# Patient Record
Sex: Female | Born: 1935 | Hispanic: Yes | Smoking: Never smoker
Health system: Southern US, Community
[De-identification: ages and names within clinical notes are randomized; demographics above are authoritative.]

## PROBLEM LIST (undated history)

## (undated) DIAGNOSIS — I10 Essential (primary) hypertension: Secondary | ICD-10-CM

## (undated) DIAGNOSIS — Z973 Presence of spectacles and contact lenses: Secondary | ICD-10-CM

## (undated) DIAGNOSIS — Z972 Presence of dental prosthetic device (complete) (partial): Secondary | ICD-10-CM

## (undated) DIAGNOSIS — M8580 Other specified disorders of bone density and structure, unspecified site: Secondary | ICD-10-CM

## (undated) DIAGNOSIS — E039 Hypothyroidism, unspecified: Secondary | ICD-10-CM

## (undated) DIAGNOSIS — E559 Vitamin D deficiency, unspecified: Secondary | ICD-10-CM

## (undated) DIAGNOSIS — E7800 Pure hypercholesterolemia, unspecified: Secondary | ICD-10-CM

## (undated) DIAGNOSIS — Z9989 Dependence on other enabling machines and devices: Secondary | ICD-10-CM

## (undated) DIAGNOSIS — Z9181 History of falling: Secondary | ICD-10-CM

## (undated) DIAGNOSIS — I208 Other forms of angina pectoris: Secondary | ICD-10-CM

## (undated) DIAGNOSIS — I739 Peripheral vascular disease, unspecified: Secondary | ICD-10-CM

## (undated) DIAGNOSIS — I2089 Other forms of angina pectoris: Secondary | ICD-10-CM

## (undated) DIAGNOSIS — I2510 Atherosclerotic heart disease of native coronary artery without angina pectoris: Secondary | ICD-10-CM

## (undated) DIAGNOSIS — F329 Major depressive disorder, single episode, unspecified: Secondary | ICD-10-CM

## (undated) DIAGNOSIS — G4733 Obstructive sleep apnea (adult) (pediatric): Secondary | ICD-10-CM

## (undated) DIAGNOSIS — K219 Gastro-esophageal reflux disease without esophagitis: Secondary | ICD-10-CM

## (undated) DIAGNOSIS — M549 Dorsalgia, unspecified: Secondary | ICD-10-CM

## (undated) DIAGNOSIS — H269 Unspecified cataract: Secondary | ICD-10-CM

## (undated) HISTORY — PX: UPPER GI ENDOSCOPY: SHX6162

## (undated) HISTORY — DX: Dependence on other enabling machines and devices: Z99.89

## (undated) HISTORY — PX: BACK SURGERY: SHX140

## (undated) HISTORY — DX: History of falling: Z91.81

## (undated) HISTORY — DX: Pure hypercholesterolemia, unspecified: E78.00

## (undated) HISTORY — DX: Other forms of angina pectoris: I20.8

## (undated) HISTORY — DX: Hypothyroidism, unspecified: E03.9

## (undated) HISTORY — PX: SPINAL FUSION: SHX223

## (undated) HISTORY — DX: Other specified disorders of bone density and structure, unspecified site: M85.80

## (undated) HISTORY — DX: Gastro-esophageal reflux disease without esophagitis: K21.9

## (undated) HISTORY — DX: Major depressive disorder, single episode, unspecified: F32.9

## (undated) HISTORY — DX: Essential (primary) hypertension: I10

## (undated) HISTORY — PX: ANKLE SURGERY: SHX546

## (undated) HISTORY — DX: Atherosclerotic heart disease of native coronary artery without angina pectoris: I25.10

## (undated) HISTORY — PX: MULTIPLE TOOTH EXTRACTIONS: SHX2053

## (undated) HISTORY — DX: Vitamin D deficiency, unspecified: E55.9

## (undated) HISTORY — DX: Dorsalgia, unspecified: M54.9

## (undated) HISTORY — DX: Peripheral vascular disease, unspecified: I73.9

## (undated) HISTORY — DX: Other forms of angina pectoris: I20.89

## (undated) HISTORY — PX: EYE SURGERY: SHX253

## (undated) HISTORY — DX: Obstructive sleep apnea (adult) (pediatric): G47.33

## (undated) HISTORY — PX: COLONOSCOPY: SHX174

---

## 2008-06-07 HISTORY — PX: CARDIAC CATHETERIZATION: SHX172

## 2018-05-21 ENCOUNTER — Ambulatory Visit
Admission: RE | Admit: 2018-05-21 | Discharge: 2018-05-21 | Disposition: A | Discharge status: Home / Self Care | Payer: No Typology Code available for payment source | Source: Ambulatory Visit | Attending: Gerontology | Admitting: Gerontology

## 2018-05-21 ENCOUNTER — Other Ambulatory Visit: Payer: Self-pay | Admitting: Gerontology

## 2018-05-21 DIAGNOSIS — M545 Low back pain, unspecified: Secondary | ICD-10-CM

## 2018-05-21 DIAGNOSIS — R52 Pain, unspecified: Secondary | ICD-10-CM

## 2018-05-21 DIAGNOSIS — S2239XA Fracture of one rib, unspecified side, initial encounter for closed fracture: Secondary | ICD-10-CM

## 2018-05-28 ENCOUNTER — Encounter (HOSPITAL_BASED_OUTPATIENT_CLINIC_OR_DEPARTMENT_OTHER): Payer: Self-pay

## 2018-05-28 DIAGNOSIS — R5383 Other fatigue: Secondary | ICD-10-CM

## 2018-06-04 ENCOUNTER — Encounter: Payer: Self-pay | Admitting: Internal Medicine

## 2018-06-04 ENCOUNTER — Ambulatory Visit (INDEPENDENT_AMBULATORY_CARE_PROVIDER_SITE_OTHER): Payer: Medicare (Managed Care) | Admitting: Internal Medicine

## 2018-06-04 VITALS — BP 112/64 | HR 75 | Ht 60.0 in | Wt 163.2 lb

## 2018-06-04 DIAGNOSIS — I200 Unstable angina: Secondary | ICD-10-CM | POA: Diagnosis not present

## 2018-06-04 DIAGNOSIS — R0609 Other forms of dyspnea: Secondary | ICD-10-CM

## 2018-06-04 DIAGNOSIS — I10 Essential (primary) hypertension: Secondary | ICD-10-CM

## 2018-06-04 DIAGNOSIS — E785 Hyperlipidemia, unspecified: Secondary | ICD-10-CM | POA: Diagnosis not present

## 2018-06-04 LAB — COMPREHENSIVE METABOLIC PANEL
A/G RATIO: 1.8 (ref 1.2–2.2)
ALK PHOS: 87 IU/L (ref 39–117)
ALT: 10 IU/L (ref 0–32)
AST: 15 IU/L (ref 0–40)
Albumin: 4.2 g/dL (ref 3.5–4.7)
BILIRUBIN TOTAL: 0.3 mg/dL (ref 0.0–1.2)
BUN / CREAT RATIO: 29 — AB (ref 12–28)
BUN: 29 mg/dL — AB (ref 8–27)
CALCIUM: 9.6 mg/dL (ref 8.7–10.3)
CHLORIDE: 103 mmol/L (ref 96–106)
CO2: 25 mmol/L (ref 20–29)
Creatinine, Ser: 0.99 mg/dL (ref 0.57–1.00)
GFR calc non Af Amer: 54 mL/min/{1.73_m2} — ABNORMAL LOW (ref >59)
GFR, EST AFRICAN AMERICAN: 62 mL/min/{1.73_m2} (ref >59)
GLOBULIN, TOTAL: 2.4 g/dL (ref 1.5–4.5)
Glucose: 86 mg/dL (ref 65–99)
Potassium: 5.4 mmol/L — ABNORMAL HIGH (ref 3.5–5.2)
SODIUM: 141 mmol/L (ref 134–144)
TOTAL PROTEIN: 6.6 g/dL (ref 6.0–8.5)

## 2018-06-04 LAB — LIPID PANEL
Chol/HDL Ratio: 1.9 ratio (ref 0.0–4.4)
Cholesterol, Total: 124 mg/dL (ref 100–199)
HDL: 66 mg/dL (ref >39)
LDL Calculated: 47 mg/dL (ref 0–99)
Triglycerides: 56 mg/dL (ref 0–149)
VLDL CHOLESTEROL CAL: 11 mg/dL (ref 5–40)

## 2018-06-04 LAB — CBC
HEMOGLOBIN: 12.1 g/dL (ref 11.1–15.9)
Hematocrit: 37.7 % (ref 34.0–46.6)
MCH: 26 pg — ABNORMAL LOW (ref 26.6–33.0)
MCHC: 32.1 g/dL (ref 31.5–35.7)
MCV: 81 fL (ref 79–97)
Platelets: 292 10*3/uL (ref 150–450)
RBC: 4.65 x10E6/uL (ref 3.77–5.28)
RDW: 15.8 % — AB (ref 12.3–15.4)
WBC: 6.8 10*3/uL (ref 3.4–10.8)

## 2018-06-04 MED ORDER — RANOLAZINE ER 1000 MG PO TB12: 1000.0000 mg | ORAL_TABLET | Freq: Two times a day (BID) | ORAL | 3 refills | Status: DC

## 2018-06-04 NOTE — Progress Notes (Unsigned)
LEXISCAN LETTER

## 2018-06-04 NOTE — Progress Notes (Signed)
New Outpatient Visit Date: 06/04/2018  Referring Provider: Jethro Bastos, MD [address] [place], [place] [address]  Chief Complaint: Chest pain  HPI:  Jennifer [name] is a 84 y.o. female who is being seen today for the evaluation of stable angina at the request of Jethro Bastos, MD. She has a history of coronary artery disease with stable angina, hypertension, hyperlipidemia, obstructive sleep apnea on CPAP, GERD with history of hiatal hernia and esophageal stricture, and chronic back pain with radiculopathy.  The patient moved to [place] from [place] approximately 2 months ago.  She has a long history of stable angina following PCI to the LAD in 06/2008.  She reports chronic exertional dyspnea and chest pressure when walking more than a block.  Maximal pain intensity is 6/10, with the discomfort resolving with rest over 5 to 10 minutes.  However, over the last 2 months she feels as though the symptoms are coming on a little bit faster than they had in the past.  She last used sublingual nitroglycerin about a month ago.  She has been maintained on carvedilol and ranolazine for years.  Her last follow-up with her cardiologist in [place], [place], was approximately 6 months ago.  She was previously told that her LAD stent was patent but at that a small vessel was jailed by the stent and needed to be managed medically.  She reports having had a normal stress test about a year ago while in [place].  The patient has occasional "skipped beats" and rare dizziness.  She has not passed out.  She has stable orthopnea and uses CPAP nightly.  She has minimal chronic leg edema.  --------------------------------------------------------------------------------------------------  Cardiovascular History & Procedures: Cardiovascular Problems:  Coronary artery disease with stable angina  Risk Factors:  Known coronary artery disease, hypertension, hyperlipidemia, obesity, and age greater than  63  Cath/PCI:  LHC/PCI (06/2008): Promus 3.0 x 12 mm drug-eluting stent placed to the LAD.  Further details not available.  CV Surgery:  None  EP Procedures and Devices:  None  Non-Invasive Evaluation(s):  None available.  --------------------------------------------------------------------------------------------------  Past Medical History:  Diagnosis Date  . At risk for falls   . CAD (coronary artery disease)   . GERD (gastroesophageal reflux disease)   . Hypercholesterolemia   . Hypertension   . Hypothyroidism   . Major depression   . Musculoskeletal back pain    CHRONIC LEFT-SIDED DISCOMFORT  . Obstructive sleep apnea on CPAP   . Osteopenia   . PVD (peripheral vascular disease) (HCC)   . Stable angina (HCC)   . Vitamin D deficiency     Past Surgical History:  Procedure Laterality Date  . ANKLE SURGERY Left   . CARDIAC CATHETERIZATION  06/2008   PCI to LAD with 3.0 x 12 mm DES  . SPINAL FUSION      Current Meds  Medication Sig  . acetaminophen (TYLENOL) 325 MG tablet Take 650 mg by mouth every 12 (twelve) hours.  Marland Kitchen aspirin EC 81 MG tablet Take 81 mg by mouth daily. CARDIAC EVENT PREVENTION  . atorvastatin (LIPITOR) 20 MG tablet Take 20 mg by mouth daily.  . carvedilol (COREG) 25 MG tablet Take 25 mg by mouth 2 (two) times daily with a meal.  . Cholecalciferol (VITAMIN D3) 5000 units TABS Take 1 tablet by mouth daily with breakfast.  . enalapril (VASOTEC) 20 MG tablet Take 20 mg by mouth daily.  . furosemide (LASIX) 20 MG tablet Take 20 mg by  mouth daily.  Marland Kitchen levothyroxine (SYNTHROID, LEVOTHROID) 50 MCG tablet Take 50 mcg by mouth daily before breakfast.  . mirtazapine (REMERON) 15 MG tablet Take 7.5 mg by mouth at bedtime. TAKE 1/2 TABLET BY MOUTH AT BEDTIME.  . naproxen (NAPROSYN) 500 MG tablet Take 500 mg by mouth 2 (two) times daily with a meal.  . nitroGLYCERIN (NITROSTAT) 0.4 MG SL tablet Place 0.4 mg under the tongue every 5 (five) minutes as needed  for chest pain.  . pantoprazole (PROTONIX) 20 MG tablet Take 20 mg by mouth daily.  . traMADol (ULTRAM) 50 MG tablet Take by mouth daily as needed for moderate pain.  . [DISCONTINUED] ranolazine (RANEXA) 500 MG 12 hr tablet Take 500 mg by mouth 2 (two) times daily.    Allergies: Patient has no known allergies.  Social History   Tobacco Use  . Smoking status: Never Smoker  . Smokeless tobacco: Never Used  Substance Use Topics  . Alcohol use: Never    Frequency: Never  . Drug use: Never    Family History  Problem Relation Age of Onset  . Heart disease Mother   . Asthma Father   . Heart disease Brother 19       CAD    Review of Systems: A 12-system review of systems was performed and was negative except as noted in the HPI.  --------------------------------------------------------------------------------------------------  Physical Exam: BP 112/64   Pulse 75   Ht 5' (1.524 m)   Wt 163 lb 4 oz (74 kg)   SpO2 95%   BMI 31.88 kg/m   General: NAD. HEENT: No conjunctival pallor or scleral icterus. Moist mucous membranes. OP clear. Neck: Supple without lymphadenopathy, thyromegaly, JVD, or HJR. No carotid bruit. Lungs: Normal work of breathing. Clear to auscultation bilaterally without wheezes or crackles. Heart: Regular rate and rhythm without murmurs, rubs, or gallops. Non-displaced PMI. Abd: Bowel sounds present. Soft, NT/ND without hepatosplenomegaly Ext: Trace pretibial edema bilaterally. Radial, PT, and DP pulses are 2+ bilaterally Skin: Warm and dry without rash. Neuro: CNIII-XII intact. Strength and fine-touch sensation intact in upper and lower extremities bilaterally. Psych: Normal mood and affect.  EKG: Sinus bradycardia (heart rate 54 bpm) with isolated PACs.  Otherwise, no significant abnormalities.  Outside labs (05/27/2018): CBC: White blood cell count 6.5, hemoglobin 10.9, hematocrit 34.7, platelets 264  CMP: Sodium 140, potassium 5.2, chloride 104, CO2  26, BUN 14, creatinine 0.9, glucose 95 calcium 9.0, AST 20, ALT 8, phosphatase 96, total bilirubin 0.2, total protein 6.3, albumin 3.4  Lipid panel: Total cholesterol 101, triglycerides 45, HDL 61, LDL 31  TSH 1.43  Total CK 50  --------------------------------------------------------------------------------------------------  ASSESSMENT AND PLAN: Coronary artery disease with accelerating angina The patient has a long history of angina that seems to have gotten slightly worse over the last few months since moving to [place].  She does not have any symptoms at rest: Symptoms are consistent with CCS class II-III.  EKG today does not show any acute ischemic changes.  We will request records from her former cardiologist Freeman Caldron in [place], [place]).  We will also arrange for a pharmacologic myocardial perfusion stress test to assess for significant ischemia.  In the meantime, I will increase ranolazine to 1000 mg twice daily.  We will check a CBC, CMP, and lipid panel to establish baseline labs today.  Dyspnea on exertion Long-standing problem.  Other than mild lower extremity edema, Jennifer [name] appears euvolemic on exam.  We will obtain records, as above,  increase ranolazine to 1000 mg twice daily, and obtain a transthoracic echocardiogram.  If stress test and echocardiogram are unrevealing, de-escalation of carvedilol may need to be considered in case excessive beta-blockade is contributing to her DOE.  We will continue with furosemide 20 mg daily for possible component of chronic diastolic heart failure.  Hyperlipidemia Goal LDL less than 70, given history of CAD and PCI.  I will check a CMP and fasting lipid panel today.  We will continue atorvastatin 20 mg daily for now, though titration may be necessary based on results of lipid panel.  Hypertension Blood pressure well controlled today.  I will not make any medication changes at this time.  Follow-up: Return to clinic in 1  month.  Yvonne Kendallhristopher Magally Vahle, MD 06/05/2018 12:33 PM

## 2018-06-04 NOTE — Patient Instructions (Addendum)
Medication Instructions:  INCREASE Ranexa to 1,000 mg twice per day  -- If you need a refill on your cardiac medications before your next appointment, please call your pharmacy. --  Labwork:TODAY CBC/CMET/LIPID PROFILE  Testing/Procedures: PLEASE SCHEDULE Your physician has requested that you have an echocardiogram. Echocardiography is a painless test that uses sound waves to create images of your heart. It provides your doctor with information about the size and shape of your heart and how well your heart's chambers and valves are working. This procedure takes approximately one hour. There are no restrictions for this procedure.  Your physician has requested that you have a lexiscan myoview. For further information please visit [email or web address]. Please follow instruction sheet, as given.   Follow-Up: Your physician wants you to follow-up in: 1 MONTH with APP    Thank you for choosing [facility]!!    Any Other Special Instructions Will Be Listed Below (If Applicable).

## 2018-06-05 ENCOUNTER — Encounter: Payer: Self-pay | Admitting: Internal Medicine

## 2018-06-05 DIAGNOSIS — I25119 Atherosclerotic heart disease of native coronary artery with unspecified angina pectoris: Secondary | ICD-10-CM | POA: Insufficient documentation

## 2018-06-05 DIAGNOSIS — E785 Hyperlipidemia, unspecified: Secondary | ICD-10-CM | POA: Insufficient documentation

## 2018-06-05 DIAGNOSIS — I10 Essential (primary) hypertension: Secondary | ICD-10-CM | POA: Insufficient documentation

## 2018-06-05 DIAGNOSIS — R0609 Other forms of dyspnea: Secondary | ICD-10-CM

## 2018-06-07 ENCOUNTER — Other Ambulatory Visit: Payer: Self-pay

## 2018-06-07 DIAGNOSIS — E875 Hyperkalemia: Secondary | ICD-10-CM

## 2018-06-14 ENCOUNTER — Telehealth (HOSPITAL_COMMUNITY): Payer: Self-pay | Admitting: *Deleted

## 2018-06-14 NOTE — Telephone Encounter (Signed)
Patient given detailed instructions per Myocardial Perfusion Study Information Sheet for the test on 06/17/18 at 0715. Patient notified to arrive 15 minutes early and that it is imperative to arrive on time for appointment to keep from having the test rescheduled.  If you need to cancel or reschedule your appointment, please call the office within 24 hours of your appointment. . Patient verbalized understanding.Joshlynn Alfonzo, Adelene IdlerCynthia W

## 2018-06-17 ENCOUNTER — Encounter (HOSPITAL_COMMUNITY): Payer: Self-pay | Admitting: Emergency Medicine

## 2018-06-17 ENCOUNTER — Ambulatory Visit (HOSPITAL_BASED_OUTPATIENT_CLINIC_OR_DEPARTMENT_OTHER): Payer: Medicare (Managed Care)

## 2018-06-17 ENCOUNTER — Emergency Department (HOSPITAL_COMMUNITY)
Admission: EM | Admit: 2018-06-17 | Discharge: 2018-06-17 | Disposition: A | Discharge status: Home / Self Care | Payer: Medicare (Managed Care) | Attending: Emergency Medicine | Admitting: Emergency Medicine

## 2018-06-17 ENCOUNTER — Other Ambulatory Visit: Payer: Self-pay

## 2018-06-17 ENCOUNTER — Other Ambulatory Visit: Payer: Medicare (Managed Care)

## 2018-06-17 DIAGNOSIS — E875 Hyperkalemia: Secondary | ICD-10-CM | POA: Insufficient documentation

## 2018-06-17 DIAGNOSIS — I2510 Atherosclerotic heart disease of native coronary artery without angina pectoris: Secondary | ICD-10-CM | POA: Insufficient documentation

## 2018-06-17 DIAGNOSIS — I200 Unstable angina: Secondary | ICD-10-CM

## 2018-06-17 DIAGNOSIS — G4733 Obstructive sleep apnea (adult) (pediatric): Secondary | ICD-10-CM | POA: Insufficient documentation

## 2018-06-17 DIAGNOSIS — E785 Hyperlipidemia, unspecified: Secondary | ICD-10-CM | POA: Insufficient documentation

## 2018-06-17 DIAGNOSIS — R899 Unspecified abnormal finding in specimens from other organs, systems and tissues: Secondary | ICD-10-CM

## 2018-06-17 DIAGNOSIS — I10 Essential (primary) hypertension: Secondary | ICD-10-CM | POA: Insufficient documentation

## 2018-06-17 DIAGNOSIS — I340 Nonrheumatic mitral (valve) insufficiency: Secondary | ICD-10-CM | POA: Insufficient documentation

## 2018-06-17 DIAGNOSIS — R079 Chest pain, unspecified: Secondary | ICD-10-CM | POA: Insufficient documentation

## 2018-06-17 DIAGNOSIS — R0609 Other forms of dyspnea: Secondary | ICD-10-CM

## 2018-06-17 DIAGNOSIS — R9439 Abnormal result of other cardiovascular function study: Secondary | ICD-10-CM | POA: Insufficient documentation

## 2018-06-17 DIAGNOSIS — Z7982 Long term (current) use of aspirin: Secondary | ICD-10-CM | POA: Insufficient documentation

## 2018-06-17 DIAGNOSIS — R002 Palpitations: Secondary | ICD-10-CM

## 2018-06-17 DIAGNOSIS — E7800 Pure hypercholesterolemia, unspecified: Secondary | ICD-10-CM | POA: Diagnosis not present

## 2018-06-17 DIAGNOSIS — E039 Hypothyroidism, unspecified: Secondary | ICD-10-CM | POA: Insufficient documentation

## 2018-06-17 DIAGNOSIS — R42 Dizziness and giddiness: Secondary | ICD-10-CM | POA: Insufficient documentation

## 2018-06-17 DIAGNOSIS — I272 Pulmonary hypertension, unspecified: Secondary | ICD-10-CM | POA: Insufficient documentation

## 2018-06-17 DIAGNOSIS — Z79899 Other long term (current) drug therapy: Secondary | ICD-10-CM | POA: Insufficient documentation

## 2018-06-17 DIAGNOSIS — I25110 Atherosclerotic heart disease of native coronary artery with unstable angina pectoris: Secondary | ICD-10-CM

## 2018-06-17 LAB — CBC WITH DIFFERENTIAL/PLATELET
Abs Immature Granulocytes: 0 10*3/uL (ref 0.0–0.1)
BASOS ABS: 0.1 10*3/uL (ref 0.0–0.1)
Basophils Relative: 1 %
EOS PCT: 14 %
Eosinophils Absolute: 0.7 10*3/uL (ref 0.0–0.7)
HCT: 35.6 % — ABNORMAL LOW (ref 36.0–46.0)
HEMOGLOBIN: 11.1 g/dL — AB (ref 12.0–15.0)
IMMATURE GRANULOCYTES: 0 %
LYMPHS PCT: 27 %
Lymphs Abs: 1.4 10*3/uL (ref 0.7–4.0)
MCH: 26.1 pg (ref 26.0–34.0)
MCHC: 31.2 g/dL (ref 30.0–36.0)
MCV: 83.6 fL (ref 78.0–100.0)
Monocytes Absolute: 0.6 10*3/uL (ref 0.1–1.0)
Monocytes Relative: 12 %
NEUTROS PCT: 46 %
Neutro Abs: 2.4 10*3/uL (ref 1.7–7.7)
Platelets: 249 10*3/uL (ref 150–400)
RBC: 4.26 MIL/uL (ref 3.87–5.11)
RDW: 17.5 % — AB (ref 11.5–15.5)
WBC: 5.2 10*3/uL (ref 4.0–10.5)

## 2018-06-17 LAB — MYOCARDIAL PERFUSION IMAGING
CHL CUP NUCLEAR SSS: 11
CHL CUP RESTING HR STRESS: 57 {beats}/min
LVDIAVOL: 64 mL (ref 46–106)
LVSYSVOL: 19 mL
Peak HR: 86 {beats}/min
RATE: 0.29
SDS: 3
SRS: 8
TID: 0.84

## 2018-06-17 LAB — BASIC METABOLIC PANEL
ANION GAP: 6 (ref 5–15)
BUN: 18 mg/dL (ref 8–23)
CALCIUM: 8.7 mg/dL — AB (ref 8.9–10.3)
CHLORIDE: 106 mmol/L (ref 98–111)
CO2: 29 mmol/L (ref 22–32)
CREATININE: 1.14 mg/dL — AB (ref 0.44–1.00)
GFR calc non Af Amer: 44 mL/min — ABNORMAL LOW (ref >60)
GFR, EST AFRICAN AMERICAN: 51 mL/min — AB (ref >60)
Glucose, Bld: 122 mg/dL — ABNORMAL HIGH (ref 70–99)
Potassium: 4.2 mmol/L (ref 3.5–5.1)
SODIUM: 141 mmol/L (ref 135–145)

## 2018-06-17 LAB — I-STAT CHEM 8, ED
BUN: 20 mg/dL (ref 8–23)
CHLORIDE: 104 mmol/L (ref 98–111)
Calcium, Ion: 1.19 mmol/L (ref 1.15–1.40)
Creatinine, Ser: 1 mg/dL (ref 0.44–1.00)
GLUCOSE: 115 mg/dL — AB (ref 70–99)
HEMATOCRIT: 36 % (ref 36.0–46.0)
HEMOGLOBIN: 12.2 g/dL (ref 12.0–15.0)
POTASSIUM: 4 mmol/L (ref 3.5–5.1)
SODIUM: 141 mmol/L (ref 135–145)
TCO2: 29 mmol/L (ref 22–32)

## 2018-06-17 LAB — ECHOCARDIOGRAM COMPLETE
HEIGHTINCHES: 60 in
WEIGHTICAEL: 2608 [oz_av]

## 2018-06-17 MED ORDER — REGADENOSON 0.4 MG/5ML IV SOLN
0.4000 mg | Freq: Once | INTRAVENOUS | Status: AC
  Administered 2018-06-17: 0.4 mg via INTRAVENOUS

## 2018-06-17 MED ORDER — SODIUM CHLORIDE 0.9 % IV BOLUS
1000.0000 mL | Freq: Once | INTRAVENOUS | Status: AC
  Administered 2018-06-17: 1000 mL via INTRAVENOUS

## 2018-06-17 MED ORDER — TECHNETIUM TC 99M TETROFOSMIN IV KIT
10.3000 | PACK | Freq: Once | INTRAVENOUS | Status: AC | PRN
  Administered 2018-06-17: 10.3 via INTRAVENOUS
  Filled 2018-06-17: qty 11

## 2018-06-17 MED ORDER — TECHNETIUM TC 99M TETROFOSMIN IV KIT
31.6000 | PACK | Freq: Once | INTRAVENOUS | Status: AC | PRN
  Administered 2018-06-17: 31.6 via INTRAVENOUS
  Filled 2018-06-17: qty 32

## 2018-06-17 NOTE — ED Triage Notes (Signed)
Pt from home, states she went to cardiologist for stress test And EKG. Went home and Cards called and said K. 7, they advised her go to ER for evaluation. Denies chest pain or complaints. 146/84, HR 62, resp 16, 98% room air.

## 2018-06-17 NOTE — ED Provider Notes (Signed)
[facility] EMERGENCY DEPARTMENT Provider Note   CSN: [id number] Arrival date & time: 06/17/18  1824     History   Chief Complaint Chief Complaint  Patient presents with  . abnormal labs    HPI Jennifer [name] is a 84 y.o. female.  84 yo F with a chief complaint of hyperkalemia.  Patient is unsure why she had her labs drawn today.  She denies any symptoms.  She was called and told that her potassium was high and that she should come to the emergency department for evaluation.  She says they added a fluid pill in the past couple weeks but does not think they have added any other medications.  She is not sure why they started her on a fluid pill.  The history is provided by the patient.  Illness  This is a new problem. The current episode started less than 1 hour ago. The problem occurs constantly. The problem has not changed since onset.Pertinent negatives include no chest pain, no headaches and no shortness of breath. Nothing aggravates the symptoms. Nothing relieves the symptoms. She has tried nothing for the symptoms. The treatment provided no relief.    Past Medical History:  Diagnosis Date  . At risk for falls   . CAD (coronary artery disease)   . GERD (gastroesophageal reflux disease)   . Hypercholesterolemia   . Hypertension   . Hypothyroidism   . Major depression   . Musculoskeletal back pain    CHRONIC LEFT-SIDED DISCOMFORT  . Obstructive sleep apnea on CPAP   . Osteopenia   . PVD (peripheral vascular disease) (HCC)   . Stable angina (HCC)   . Vitamin D deficiency     Patient Active Problem List   Diagnosis Date Noted  . Accelerating angina (HCC) 06/05/2018  . Dyspnea on exertion 06/05/2018  . Hyperlipidemia LDL goal <70 06/05/2018  . Essential hypertension 06/05/2018    Past Surgical History:  Procedure Laterality Date  . ANKLE SURGERY Left   . CARDIAC CATHETERIZATION  06/2008   PCI to LAD with 3.0 x 12 mm DES  . SPINAL FUSION        OB History   None      Home Medications    Prior to Admission medications   Medication Sig Start Date End Date Taking? Authorizing Provider  acetaminophen (TYLENOL) 325 MG tablet Take 650 mg by mouth every 12 (twelve) hours.    [provider]  aspirin EC 81 MG tablet Take 81 mg by mouth daily. CARDIAC EVENT PREVENTION    [provider]  atorvastatin (LIPITOR) 20 MG tablet Take 20 mg by mouth daily.    [provider]  carvedilol (COREG) 25 MG tablet Take 25 mg by mouth 2 (two) times daily with a meal.    [provider]  Cholecalciferol (VITAMIN D3) 5000 units TABS Take 1 tablet by mouth daily with breakfast.    [provider]  enalapril (VASOTEC) 20 MG tablet Take 20 mg by mouth daily.    [provider]  furosemide (LASIX) 20 MG tablet Take 20 mg by mouth daily.    [provider]  levothyroxine (SYNTHROID, LEVOTHROID) 50 MCG tablet Take 50 mcg by mouth daily before breakfast.    [provider]  mirtazapine (REMERON) 15 MG tablet Take 7.5 mg by mouth at bedtime. TAKE 1/2 TABLET BY MOUTH AT BEDTIME.    [provider]  naproxen (NAPROSYN) 500 MG tablet Take 500 mg by mouth  2 (two) times daily with a meal.    [provider]  nitroGLYCERIN (NITROSTAT) 0.4 MG SL tablet Place 0.4 mg under the tongue every 5 (five) minutes as needed for chest pain.    [provider]  pantoprazole (PROTONIX) 20 MG tablet Take 20 mg by mouth daily.    [provider]  ranolazine (RANEXA) 1000 MG SR tablet Take 1 tablet (1,000 mg total) by mouth 2 (two) times daily. 06/04/18   End, Cristal Deerhristopher, MD  traMADol (ULTRAM) 50 MG tablet Take by mouth daily as needed for moderate pain.    [provider]    Family History Family History  Problem Relation Age of Onset  . Heart disease Mother   . Asthma Father   . Heart disease Brother 1435       CAD    Social History Social History    Tobacco Use  . Smoking status: Never Smoker  . Smokeless tobacco: Never Used  Substance Use Topics  . Alcohol use: Never    Frequency: Never  . Drug use: Never     Allergies   Patient has no known allergies.   Review of Systems Review of Systems  Constitutional: Negative for chills and fever.  HENT: Negative for congestion and rhinorrhea.   Eyes: Negative for redness and visual disturbance.  Respiratory: Negative for shortness of breath and wheezing.   Cardiovascular: Negative for chest pain and palpitations.  Gastrointestinal: Negative for nausea and vomiting.  Genitourinary: Negative for dysuria and urgency.  Musculoskeletal: Negative for arthralgias and myalgias.  Skin: Negative for pallor and wound.  Neurological: Negative for dizziness and headaches.     Physical Exam Updated Vital Signs BP (!) 145/63   Pulse (!) 58   Temp 98.3 F (36.8 C) (Oral)   Resp (!) 21   Wt 73.9 kg (163 lb)   SpO2 94%   BMI 31.83 kg/m   Physical Exam  Constitutional: She is oriented to person, place, and time. She appears well-developed and well-nourished. No distress.  HENT:  Head: Normocephalic and atraumatic.  Eyes: Pupils are equal, round, and reactive to light. EOM are normal.  Neck: Normal range of motion. Neck supple.  Cardiovascular: Normal rate and regular rhythm. Exam reveals no gallop and no friction rub.  No murmur heard. Pulmonary/Chest: Effort normal. She has no wheezes. She has no rales.  Abdominal: Soft. She exhibits no distension. There is no tenderness.  Musculoskeletal: She exhibits no edema or tenderness.  Neurological: She is alert and oriented to person, place, and time.  Skin: Skin is warm and dry. She is not diaphoretic.  Psychiatric: She has a normal mood and affect. Her behavior is normal.  Nursing note and vitals reviewed.    ED Treatments / Results  Labs (all labs ordered are listed, but only abnormal results are displayed) Labs Reviewed  CBC  WITH DIFFERENTIAL/PLATELET - Abnormal; Notable for the following components:      Result Value   Hemoglobin 11.1 (*)    HCT 35.6 (*)    RDW 17.5 (*)    All other components within normal limits  BASIC METABOLIC PANEL - Abnormal; Notable for the following components:   Glucose, Bld 122 (*)    Creatinine, Ser 1.14 (*)    Calcium 8.7 (*)    GFR calc non Af Amer 44 (*)    GFR calc Af Amer 51 (*)    All other components within normal limits  I-STAT CHEM 8, ED - Abnormal; Notable  for the following components:   Glucose, Bld 115 (*)    All other components within normal limits    EKG EKG Interpretation  Date/Time:  Thursday June 17 2018 18:34:18 EDT Ventricular Rate:  60 PR Interval:    QRS Duration: 88 QT Interval:  398 QTC Calculation: 398 R Axis:   75 Text Interpretation:  Sinus rhythm Low voltage, precordial leads Baseline wander in lead(s) II III aVF V6 No old tracing to compare Confirmed by Melene Plan [id number]) on 06/17/2018 6:47:15 PM   Radiology No results found.  Procedures Procedures (including critical care time)  Medications Ordered in ED Medications  sodium chloride 0.9 % bolus 1,000 mL (0 mLs Intravenous Stopped 06/17/18 2000)     Initial Impression / Assessment and Plan / ED Course  I have reviewed the triage vital signs and the nursing notes.  Pertinent labs & imaging results that were available during my care of the patient were reviewed by me and considered in my medical decision making (see chart for details).     84 yo F with a chief complaint of hyperkalemia.  Patient had routine labs drawn today by cardiology and was found to have a potassium of 7.1.  She was then called and told to come to the ED.  Patient denies any symptoms recently had a change to her fluid pill but denies any other changes to her medications.  EKG performed here without any concerning findings for hyperkalemia.  Will await lab draw.  Lab work is unremarkable potassium is 4.  Renal  function is mildly elevated from baseline.  She is given a bag of fluids upon arrival.  At this point I will discharge home have her follow-up with her PCP I suspect the hyperkalemia was pseudohyperkalemia.  8:06 PM:  I have discussed the diagnosis/risks/treatment options with the patient and believe the pt to be eligible for discharge home to follow-up with PCP. We also discussed returning to the ED immediately if new or worsening sx occur. We discussed the sx which are most concerning (e.g., sudden worsening pain, fever, inability to tolerate by mouth) that necessitate immediate return. Medications administered to the patient during their visit and any new prescriptions provided to the patient are listed below.  Medications given during this visit Medications  sodium chloride 0.9 % bolus 1,000 mL (0 mLs Intravenous Stopped 06/17/18 2000)      The patient appears reasonably screen and/or stabilized for discharge and I doubt any other medical condition or other [facility] requiring further screening, evaluation, or treatment in the ED at this time prior to discharge.    Final Clinical Impressions(s) / ED Diagnoses   Final diagnoses:  Abnormal laboratory test    ED Discharge Orders    None       Melene Plan, DO 06/17/18 2006

## 2018-06-18 ENCOUNTER — Telehealth: Payer: Self-pay | Admitting: Internal Medicine

## 2018-06-18 LAB — BASIC METABOLIC PANEL
BUN/Creatinine Ratio: 23 (ref 12–28)
BUN: 21 mg/dL (ref 8–27)
CALCIUM: 9 mg/dL (ref 8.7–10.3)
CHLORIDE: 101 mmol/L (ref 96–106)
CO2: 24 mmol/L (ref 20–29)
Creatinine, Ser: 0.93 mg/dL (ref 0.57–1.00)
GFR calc non Af Amer: 58 mL/min/{1.73_m2} — ABNORMAL LOW (ref >59)
GFR, EST AFRICAN AMERICAN: 67 mL/min/{1.73_m2} (ref >59)
GLUCOSE: 78 mg/dL (ref 65–99)
POTASSIUM: 7.1 mmol/L — AB (ref 3.5–5.2)
Sodium: 136 mmol/L (ref 134–144)

## 2018-06-18 NOTE — Telephone Encounter (Signed)
New message    Critical lab results

## 2018-06-18 NOTE — Telephone Encounter (Signed)
[facility] called to report abn K+ from yesterday. Informed them that we saw this yesterday and addressed, sent pt to hospital.

## 2018-06-23 ENCOUNTER — Telehealth: Payer: Self-pay

## 2018-06-23 ENCOUNTER — Encounter (HOSPITAL_BASED_OUTPATIENT_CLINIC_OR_DEPARTMENT_OTHER): Payer: No Typology Code available for payment source

## 2018-06-23 NOTE — Telephone Encounter (Signed)
Called patient to follow up regarding her ED visit due to hyperkalemia possibly related to hemolysis of specimen.  Pt states she is doing "fine,"  Verbalized understanding and appreciation for the call.  Jim Likeeri Suits RN

## 2018-07-01 ENCOUNTER — Other Ambulatory Visit: Payer: Self-pay | Admitting: Gastroenterology

## 2018-07-01 DIAGNOSIS — K449 Diaphragmatic hernia without obstruction or gangrene: Secondary | ICD-10-CM

## 2018-07-14 ENCOUNTER — Encounter: Payer: Self-pay | Admitting: Physician Assistant

## 2018-07-14 ENCOUNTER — Ambulatory Visit (INDEPENDENT_AMBULATORY_CARE_PROVIDER_SITE_OTHER): Payer: Medicare (Managed Care) | Admitting: Physician Assistant

## 2018-07-14 VITALS — BP 124/80 | HR 56 | Ht 60.0 in | Wt 178.8 lb

## 2018-07-14 DIAGNOSIS — I10 Essential (primary) hypertension: Secondary | ICD-10-CM

## 2018-07-14 DIAGNOSIS — E785 Hyperlipidemia, unspecified: Secondary | ICD-10-CM

## 2018-07-14 DIAGNOSIS — I272 Pulmonary hypertension, unspecified: Secondary | ICD-10-CM | POA: Diagnosis not present

## 2018-07-14 DIAGNOSIS — I25119 Atherosclerotic heart disease of native coronary artery with unspecified angina pectoris: Secondary | ICD-10-CM

## 2018-07-14 NOTE — Patient Instructions (Signed)
Medication Instructions:  1. Your physician recommends that you continue on your current medications as directed. Please refer to the Current Medication list given to you today.   Labwork: NONE ORDERED TODAY  Testing/Procedures: NONE ORDERED TODAY  Follow-Up: SCOTT WEAVER, PAC IN 6 MONTHS    Any Other Special Instructions Will Be Listed Below (If Applicable).  WE WILL FAX OVER TODAY'S OFFICE NOTE TO [facility] WITH THE RECOMMENDATIONS FOR A REFERRAL TO PULMONOLOGY.     If you need a refill on your cardiac medications before your next appointment, please call your pharmacy.

## 2018-07-14 NOTE — Progress Notes (Signed)
Cardiology Office Note:    Date:  07/14/2018   ID:  Jennifer [name], DOB 09/25/1934, MRN [id number]  PCP:  Jennifer Adie, MD  Cardiologist:  Jennifer Bush, MD   Referring MD: Jennifer Adie, MD   Chief Complaint  Patient presents with  . Follow-up    CAD, dyspnea    History of Present Illness:    Jennifer [name] is a 84 y.o. female with coronary artery disease with stable angina, hypertension, hyperlipidemia, sleep apnea, acid reflux with history of hiatal hernia and esophageal stricture.  She established with Jennifer [name] in June 2019.  She previously lived in [place] and underwent PCI with drug-eluting stent to the LAD in 2009.  She has chronic exertional angina.  She apparently has a small vessel jailed by the stent in the LAD which is managed medically.  She reported symptoms of accelerating angina as well as dyspnea with exertion.  Records from [place] were requested.  Ranolazine dose was adjusted.  Nuclear stress test was obtained and demonstrated breast attenuation artifact but no ischemia.  The study was low risk.  An echocardiogram was obtained and demonstrated normal LV function with normal diastolic function and moderate pulmonary hypertension (PASP 55).  She did go to the emergency room on July 11 due to hyperkalemia noted on routine lab work.  However, follow-up BMET demonstrated a normal potassium (4).  Jennifer [name] returns for follow-up.  She is here alone.  Before entering the room, I received records from her prior cardiologist in [place].  She had a heart catheterization 2014 that demonstrated a patent stent in the LAD and jailed diagonal.  There was no significant stenosis in the LCx RCA.  She did have a mean PA pressure on right heart catheterization of 31.  Nuclear stress test in 2018 demonstrated no ischemia.  Echocardiogram in 2018 demonstrated normal LV function and RVSP 26.  Notes indicate that she was evaluated by pulmonology.  She tells me that she sleeps with CPAP  and also uses an occasional inhaler.  She describes progressively worsening shortness of breath over the past year.  She typically has to stop to rest and catch her breath after cleaning one room.  Since adjusting her ranolazine, she has not had further chest discomfort.  She sleeps with CPAP.  She does note chronic lower extremity swelling without significant change.  She gets dizzy when she bends over.  She denies syncope.  Prior CV studies:   The following studies were reviewed today:  Nuclear stress test 06/17/2018 Breast attenuation artifact, no ischemia, EF 71, low risk  Echocardiogram 06/17/2018 EF 60-65, normal wall motion, normal diastolic function, MAC, mild MR, PASP 55 (moderate pulmonary hypertension)  Echocardiogram 08/11/2017 ([place], [place]) Mild concentric LVH, normal wall motion, EF 60-65, normal RVSF, RVSP 26  Nuclear stress test 08/12/2017 [facility], [place]) No ischemia or scar, EF >80  Chest CTA 08/11/2017 ([place], [place]) No pulmonary embolism, multiple subcentimeter pulmonary nodules, centrilobular tree-in-bud nodules in RML unchanged from 07/22/2017, stigmata of prior granulomatous infection, CAD, moderate sized hiatal hernia  Right and left heart catheterization 11/16/2013 ([place], [place]) LAD proximal stent patent, D1 jailed from stent-unchanged LCx no significant stenosis RCA no significant stenosis EF 65 Mean PA pressure 31, mean PCWP 15  mild pulmonary hypertension   Past Medical History:  Diagnosis Date  . At risk for falls   . CAD (coronary artery disease)   . GERD (gastroesophageal reflux disease)   . Hypercholesterolemia   . Hypertension   . Hypothyroidism   .  Major depression   . Musculoskeletal back pain    CHRONIC LEFT-SIDED DISCOMFORT  . Obstructive sleep apnea on CPAP   . Osteopenia   . PVD (peripheral vascular disease) ([place])   . Stable angina (HCC)   . Vitamin D deficiency    Surgical Hx: The patient  has a past surgical history that includes  Ankle surgery (Left); Spinal fusion; and Cardiac catheterization (06/2008).   Current Medications: Current Meds  Medication Sig  . acetaminophen (TYLENOL) 325 MG tablet Take 650 mg by mouth every 12 (twelve) hours.  Marland Kitchen aspirin EC 81 MG tablet Take 81 mg by mouth daily. CARDIAC EVENT PREVENTION  . atorvastatin (LIPITOR) 20 MG tablet Take 20 mg by mouth daily.  . carvedilol (COREG) 25 MG tablet Take 25 mg by mouth 2 (two) times daily with a meal.  . Cholecalciferol (VITAMIN D3) 5000 units TABS Take 1 tablet by mouth daily with breakfast.  . enalapril (VASOTEC) 20 MG tablet Take 20 mg by mouth daily.  . furosemide (LASIX) 20 MG tablet Take 20 mg by mouth daily.  Marland Kitchen levothyroxine (SYNTHROID, LEVOTHROID) 50 MCG tablet Take 50 mcg by mouth daily before breakfast.  . mirtazapine (REMERON) 15 MG tablet Take 7.5 mg by mouth at bedtime. TAKE 1/2 TABLET BY MOUTH AT BEDTIME.  . naproxen (NAPROSYN) 500 MG tablet Take 500 mg by mouth 2 (two) times daily with a meal.  . nitroGLYCERIN (NITROSTAT) 0.4 MG SL tablet Place 0.4 mg under the tongue every 5 (five) minutes as needed for chest pain.  . pantoprazole (PROTONIX) 20 MG tablet Take 20 mg by mouth daily.  . ranolazine (RANEXA) 1000 MG SR tablet Take 1 tablet (1,000 mg total) by mouth 2 (two) times daily.  . traMADol (ULTRAM) 50 MG tablet Take by mouth daily as needed for moderate pain.     Allergies:   Patient has no known allergies.   Social History   Tobacco Use  . Smoking status: Never Smoker  . Smokeless tobacco: Never Used  Substance Use Topics  . Alcohol use: Never    Frequency: Never  . Drug use: Never     Family Hx: The patient's family history includes Asthma in her father; Heart disease in her mother; Heart disease (age of onset: 82) in her brother.  ROS:   Please see the history of present illness.    ROS All other systems reviewed and are negative.   EKGs/Labs/Other Test Reviewed:    EKG:  EKG is  ordered today.  The ekg  ordered today demonstrates sinus bradycardia, heart rate 56, normal axis, QTC 405, similar to prior tracing  Recent Labs: 06/04/2018: ALT 10 06/17/2018: BUN 20; Creatinine, Ser 1.00; Hemoglobin 12.2; Platelets 249; Potassium 4.0; Sodium 141   Recent Lipid Panel Lab Results  Component Value Date/Time   CHOL 124 06/04/2018 09:56 AM   TRIG 56 06/04/2018 09:56 AM   HDL 66 06/04/2018 09:56 AM   CHOLHDL 1.9 06/04/2018 09:56 AM   LDLCALC 47 06/04/2018 09:56 AM    Physical Exam:    VS:  BP 124/80   Pulse (!) 56   Ht 5' (1.524 m)   Wt 178 lb 12.8 oz (81.1 kg)   SpO2 94%   BMI 34.92 kg/m     Wt Readings from Last 3 Encounters:  07/14/18 178 lb 12.8 oz (81.1 kg)  06/17/18 163 lb (73.9 kg)  06/17/18 163 lb (73.9 kg)     Physical Exam  Constitutional: She is oriented to person,  place, and time. She appears well-developed and well-nourished. No distress.  HENT:  Head: Normocephalic and atraumatic.  Eyes: No scleral icterus.  Neck: Neck supple.  Cardiovascular: Normal rate, regular rhythm, S1 normal, S2 normal and normal heart sounds.  No murmur heard. Pulmonary/Chest: Effort normal. She has no rales.  Abdominal: Soft.  Musculoskeletal: She exhibits no edema.  Neurological: She is alert and oriented to person, place, and time.  Skin: Skin is warm and dry.  Psychiatric: She has a normal mood and affect.    ASSESSMENT & PLAN:    Coronary artery disease involving native coronary artery of native heart with angina pectoris [facility])  History of prior drug-eluting stent to the LAD in 2009 while living in [place].  Cardiac catheterization in 2014 demonstrated patent stent and a jailed diagonal.  She has no significant disease elsewhere.  She has a history of chronic angina.  She recently saw Jennifer [name] and underwent stress testing.  This was low risk and negative for ischemia.  She has noted significant improvement in her symptoms since increasing ranolazine.  Continue aspirin, atorvastatin,  carvedilol, ranolazine.  Pulmonary hypertension, unspecified ([place]) PASP 55 on recent echocardiogram.  She was apparently evaluated by pulmonology while living in [place].  She was told that she would need routine follow-up.  It sounds like she has an inhaler.  She had mild pulmonary hypertension on right heart catheterization in 2014.  She denies a history of smoking.  I have recommended that we get her established with pulmonology here for continued management.  -Refer to pulmonology  Essential hypertension  The patient's blood pressure is controlled on her current regimen.  Continue current therapy.   Hyperlipidemia LDL goal <70 LDL optimal on most recent lab work.  Continue current Rx.     Dispo:  Return in about 6 months (around 01/14/2019) for Routine Follow Up, w/ Richardson Dopp, PA-C.   Medication Adjustments/Labs and Tests Ordered: Current medicines are reviewed at length with the patient today.  Concerns regarding medicines are outlined above.  Tests Ordered: Orders Placed This Encounter  Procedures  . EKG 12-Lead   Medication Changes: No orders of the defined types were placed in this encounter.   Signed, Richardson Dopp, PA-C  07/14/2018 9:57 AM    [facility] [address], [place], [place]  [address] Phone: [phone]; Fax: [phone]

## 2018-07-15 ENCOUNTER — Ambulatory Visit
Admission: RE | Admit: 2018-07-15 | Discharge: 2018-07-15 | Disposition: A | Discharge status: Home / Self Care | Payer: Medicare (Managed Care) | Source: Ambulatory Visit | Attending: Gastroenterology | Admitting: Gastroenterology

## 2018-07-15 DIAGNOSIS — K449 Diaphragmatic hernia without obstruction or gangrene: Secondary | ICD-10-CM

## 2018-07-22 ENCOUNTER — Other Ambulatory Visit: Payer: Self-pay | Admitting: Family Medicine

## 2018-07-22 ENCOUNTER — Ambulatory Visit (HOSPITAL_BASED_OUTPATIENT_CLINIC_OR_DEPARTMENT_OTHER): Payer: Medicare (Managed Care) | Attending: Gerontology | Admitting: Internal Medicine

## 2018-07-22 ENCOUNTER — Ambulatory Visit
Admission: RE | Admit: 2018-07-22 | Discharge: 2018-07-22 | Disposition: A | Discharge status: Home / Self Care | Payer: Medicare (Managed Care) | Source: Ambulatory Visit | Attending: Family Medicine | Admitting: Family Medicine

## 2018-07-22 VITALS — Ht 60.0 in | Wt 173.0 lb

## 2018-07-22 DIAGNOSIS — G4733 Obstructive sleep apnea (adult) (pediatric): Secondary | ICD-10-CM | POA: Insufficient documentation

## 2018-07-22 DIAGNOSIS — M545 Low back pain: Secondary | ICD-10-CM

## 2018-07-22 DIAGNOSIS — J9811 Atelectasis: Secondary | ICD-10-CM

## 2018-07-22 DIAGNOSIS — R5383 Other fatigue: Secondary | ICD-10-CM

## 2018-07-22 DIAGNOSIS — Z9989 Dependence on other enabling machines and devices: Secondary | ICD-10-CM

## 2018-07-22 DIAGNOSIS — I10 Essential (primary) hypertension: Secondary | ICD-10-CM | POA: Insufficient documentation

## 2018-08-05 ENCOUNTER — Encounter (HOSPITAL_COMMUNITY): Payer: Self-pay | Admitting: Emergency Medicine

## 2018-08-05 ENCOUNTER — Other Ambulatory Visit: Payer: Self-pay

## 2018-08-05 ENCOUNTER — Emergency Department (HOSPITAL_COMMUNITY)
Admission: EM | Admit: 2018-08-05 | Discharge: 2018-08-05 | Disposition: A | Discharge status: Home / Self Care | Payer: Medicare (Managed Care) | Attending: Emergency Medicine | Admitting: Emergency Medicine

## 2018-08-05 DIAGNOSIS — Y9389 Activity, other specified: Secondary | ICD-10-CM | POA: Diagnosis not present

## 2018-08-05 DIAGNOSIS — Z7982 Long term (current) use of aspirin: Secondary | ICD-10-CM | POA: Diagnosis not present

## 2018-08-05 DIAGNOSIS — Y92009 Unspecified place in unspecified non-institutional (private) residence as the place of occurrence of the external cause: Secondary | ICD-10-CM | POA: Insufficient documentation

## 2018-08-05 DIAGNOSIS — Y999 Unspecified external cause status: Secondary | ICD-10-CM | POA: Insufficient documentation

## 2018-08-05 DIAGNOSIS — W19XXXA Unspecified fall, initial encounter: Secondary | ICD-10-CM

## 2018-08-05 DIAGNOSIS — S0083XA Contusion of other part of head, initial encounter: Secondary | ICD-10-CM | POA: Insufficient documentation

## 2018-08-05 DIAGNOSIS — W010XXA Fall on same level from slipping, tripping and stumbling without subsequent striking against object, initial encounter: Secondary | ICD-10-CM | POA: Diagnosis not present

## 2018-08-05 DIAGNOSIS — E039 Hypothyroidism, unspecified: Secondary | ICD-10-CM | POA: Diagnosis not present

## 2018-08-05 DIAGNOSIS — Z9181 History of falling: Secondary | ICD-10-CM | POA: Diagnosis not present

## 2018-08-05 DIAGNOSIS — I10 Essential (primary) hypertension: Secondary | ICD-10-CM | POA: Diagnosis not present

## 2018-08-05 DIAGNOSIS — S0031XA Abrasion of nose, initial encounter: Secondary | ICD-10-CM | POA: Diagnosis not present

## 2018-08-05 DIAGNOSIS — I2510 Atherosclerotic heart disease of native coronary artery without angina pectoris: Secondary | ICD-10-CM | POA: Insufficient documentation

## 2018-08-05 DIAGNOSIS — S0993XA Unspecified injury of face, initial encounter: Secondary | ICD-10-CM | POA: Diagnosis present

## 2018-08-05 DIAGNOSIS — Z79899 Other long term (current) drug therapy: Secondary | ICD-10-CM | POA: Diagnosis not present

## 2018-08-05 NOTE — ED Provider Notes (Signed)
[facility]-EMERGENCY DEPT Provider Note   CSN: [id number] Arrival date & time: 08/05/18  1648     History   Chief Complaint Chief Complaint  Patient presents with  . Fall    HPI Jennifer [name] is a 84 y.o. female.  84 year old female brought in by daughter for injuries from a fall.  Patient states that she was standing up off of the sofa to carry her dishes to the kitchen and felt like she needed to adjust her shoes.  Patient states that she went to sit back down on the sofa to adjust her shoes and ended up falling forwards striking her face on the floor.  Patient is not on blood thinners, denies loss of consciousness, was able to stand up on her own after the fall.  Patient reports bruise underneath the right eye area with a minor abrasion to her nose, denies nosebleed.  Denies feeling weak or dizzy prior to her fall, no other injuries, complaints, concerns.     Past Medical History:  Diagnosis Date  . At risk for falls   . CAD (coronary artery disease)   . GERD (gastroesophageal reflux disease)   . Hypercholesterolemia   . Hypertension   . Hypothyroidism   . Major depression   . Musculoskeletal back pain    CHRONIC LEFT-SIDED DISCOMFORT  . Obstructive sleep apnea on CPAP   . Osteopenia   . PVD (peripheral vascular disease) (HCC)   . Stable angina (HCC)   . Vitamin D deficiency     Patient Active Problem List   Diagnosis Date Noted  . Pulmonary hypertension, unspecified (HCC) 07/14/2018  . Coronary artery disease involving native coronary artery of native heart with angina pectoris (HCC) 06/05/2018  . Dyspnea on exertion 06/05/2018  . Hyperlipidemia LDL goal <70 06/05/2018  . Essential hypertension 06/05/2018    Past Surgical History:  Procedure Laterality Date  . ANKLE SURGERY Left   . CARDIAC CATHETERIZATION  06/2008   PCI to LAD with 3.0 x 12 mm DES  . SPINAL FUSION       OB History   None      Home Medications    Prior to  Admission medications   Medication Sig Start Date End Date Taking? Authorizing Provider  acetaminophen (TYLENOL) 325 MG tablet Take 650 mg by mouth every 12 (twelve) hours.    [provider]  aspirin EC 81 MG tablet Take 81 mg by mouth daily. CARDIAC EVENT PREVENTION    [provider]  atorvastatin (LIPITOR) 20 MG tablet Take 20 mg by mouth daily.    [provider]  carvedilol (COREG) 25 MG tablet Take 25 mg by mouth 2 (two) times daily with a meal.    [provider]  Cholecalciferol (VITAMIN D3) 5000 units TABS Take 1 tablet by mouth daily with breakfast.    [provider]  enalapril (VASOTEC) 20 MG tablet Take 20 mg by mouth daily.    [provider]  furosemide (LASIX) 20 MG tablet Take 20 mg by mouth daily.    [provider]  levothyroxine (SYNTHROID, LEVOTHROID) 50 MCG tablet Take 50 mcg by mouth daily before breakfast.    [provider]  mirtazapine (REMERON) 15 MG tablet Take 7.5 mg by mouth at bedtime. TAKE 1/2 TABLET BY MOUTH AT BEDTIME.    [provider]  naproxen (NAPROSYN) 500 MG tablet Take 500 mg by mouth 2 (two) times daily with a meal.    [provider]  nitroGLYCERIN (NITROSTAT) 0.4 MG SL tablet Place 0.4 mg under the tongue every 5 (five) minutes as needed for chest pain.    [provider]  pantoprazole (PROTONIX) 20 MG tablet Take 20 mg by mouth daily.    [provider]  ranolazine (RANEXA) 1000 MG SR tablet Take 1 tablet (1,000 mg total) by mouth 2 (two) times daily. 06/04/18   End, Cristal Deer, MD  traMADol (ULTRAM) 50 MG tablet Take by mouth daily as needed for moderate pain.    [provider]    Family History Family History  Problem Relation Age of Onset  . Heart disease Mother   . Asthma Father   . Heart disease Brother 6       CAD    Social History Social History   Tobacco Use  . Smoking status: Never Smoker  . Smokeless tobacco:  Never Used  Substance Use Topics  . Alcohol use: Never    Frequency: Never  . Drug use: Never     Allergies   Patient has no known allergies.   Review of Systems Review of Systems  Constitutional: Negative for fatigue and fever.  Eyes: Negative for pain and visual disturbance.  Gastrointestinal: Negative for nausea and vomiting.  Musculoskeletal: Negative for arthralgias, back pain, gait problem, myalgias, neck pain and neck stiffness.  Skin: Positive for wound.  Neurological: Negative for dizziness, weakness and headaches.  Hematological: Does not bruise/bleed easily.  Psychiatric/Behavioral: Negative for confusion.  All other systems reviewed and are negative.    Physical Exam Updated Vital Signs BP 122/67 (BP Location: Left Arm)   Pulse 64   Resp 14   SpO2 95%   Physical Exam  Constitutional: She is oriented to person, place, and time. She appears well-developed and well-nourished. No distress.  HENT:  Head: Normocephalic and atraumatic.    Nose: No septal deviation.  Mouth/Throat: Oropharynx is clear and moist. No oropharyngeal exudate.  Eyes: Pupils are equal, round, and reactive to light. Conjunctivae and EOM are normal.  Neck: Neck supple.  Cardiovascular: Normal rate, regular rhythm, normal heart sounds and intact distal pulses.  No murmur heard. Pulmonary/Chest: Effort normal and breath sounds normal. No respiratory distress.  Musculoskeletal: She exhibits no tenderness or deformity.  Neurological: She is alert and oriented to person, place, and time. She has normal strength. She is not disoriented. No cranial nerve deficit or sensory deficit. GCS eye subscore is 4. GCS verbal subscore is 5. GCS motor subscore is 6.  Skin: Skin is warm and dry. She is not diaphoretic.  Psychiatric: She has a normal mood and affect. Her behavior is normal.  Nursing note and vitals reviewed.    ED Treatments / Results  Labs (all labs ordered are listed, but only abnormal  results are displayed) Labs Reviewed - No data to display  EKG None  Radiology No results found.  Procedures Procedures (including critical care time)  Medications Ordered in ED Medications - No data to display   Initial Impression / Assessment and Plan / ED Course  I have reviewed the triage vital signs and the nursing notes.  Pertinent labs & imaging results that were available during my care of the patient were reviewed by me and considered in my medical decision making (see chart for details).  Clinical Course as of Aug 05 1858  Thu Aug 05, 2018  5210 84 year old female brought in by daughter for fall today.  Denies feeling weak or dizzy prior to the fall,  not on blood thinners.  Patient is alert and oriented, feeling well, small amount of bruising to the right maxillary area and abrasion to the nose without deformity or crepitus.  Extraocular movements intact, no other injuries or complaints. Patient and daughter agreeable with plan for dc home at this time, patient will stay with family today and follow up with PCP. Return to Er as needed.   [LM]    Clinical Course User Index [LM] Jeannie FendMurphy, Lacreshia Bondarenko A, PA-C    Final Clinical Impressions(s) / ED Diagnoses   Final diagnoses:  Fall, initial encounter  Contusion of face, initial encounter    ED Discharge Orders    None       Jeannie FendMurphy, Izic Stfort A, PA-C 08/05/18 1859    Samuel JesterMcManus, Kathleen, DO 08/05/18 2301

## 2018-08-05 NOTE — Discharge Instructions (Addendum)
Apply cold compress to face for 20 minutes at a time. Follow up with your doctor tomorrow as scheduled. Return to ER as needed.

## 2018-08-05 NOTE — ED Notes (Signed)
Bed: [facility]D Expected date:  Expected time:  Means of arrival:  Comments: EMS-fall 

## 2018-08-05 NOTE — ED Triage Notes (Signed)
Pt BIBA. Pt had mechanical fall, no LOC, no thinners. Pt hit head on ground. Pt A&Ox4. Pt has hematoma on right eye, as well as small abrasion.

## 2018-08-06 ENCOUNTER — Ambulatory Visit (INDEPENDENT_AMBULATORY_CARE_PROVIDER_SITE_OTHER): Payer: Medicare (Managed Care) | Admitting: Pulmonary Disease

## 2018-08-06 ENCOUNTER — Encounter: Payer: Self-pay | Admitting: Pulmonary Disease

## 2018-08-06 VITALS — BP 108/72 | HR 69 | Ht 60.0 in | Wt 178.0 lb

## 2018-08-06 DIAGNOSIS — R0602 Shortness of breath: Secondary | ICD-10-CM | POA: Diagnosis not present

## 2018-08-06 DIAGNOSIS — F0390 Unspecified dementia without behavioral disturbance: Secondary | ICD-10-CM

## 2018-08-06 DIAGNOSIS — G4733 Obstructive sleep apnea (adult) (pediatric): Secondary | ICD-10-CM

## 2018-08-06 NOTE — Progress Notes (Signed)
Jennifer BeachGladys [name]    [id number]    [birth year]  Primary Care Physician:Koehler, Jennifer Repressobert N, MD  Referring Physician: Jethro BastosKoehler, Jennifer N, MD [address] [place], [place] [id number]  Chief complaint:   Patient with a history of obstructive sleep apnea  History of pulmonary hypertension  HPI:  [place] She was following up with Dr. Luiz BlareSirish Kirtane  She has a history of moderate pulmonary hypertension Has been on CPAP for few years She does not feel her machine is working currently--about a week ago she had protracted bouts of coughing following putting on a CPAP  She was evaluated in the ED yesterday following a fall  Outpatient Encounter Medications as of 08/06/2018  Medication Sig  . acetaminophen (TYLENOL) 325 MG tablet Take 650 mg by mouth every 12 (twelve) hours.  Marland Kitchen. aspirin EC 81 MG tablet Take 81 mg by mouth daily. CARDIAC EVENT PREVENTION  . atorvastatin (LIPITOR) 20 MG tablet Take 20 mg by mouth daily.  . carvedilol (COREG) 25 MG tablet Take 25 mg by mouth 2 (two) times daily with a meal.  . Cholecalciferol (VITAMIN D3) 5000 units TABS Take 1 tablet by mouth daily with breakfast.  . enalapril (VASOTEC) 20 MG tablet Take 20 mg by mouth daily.  . furosemide (LASIX) 20 MG tablet Take 20 mg by mouth daily.  Marland Kitchen. levothyroxine (SYNTHROID, LEVOTHROID) 50 MCG tablet Take 50 mcg by mouth daily before breakfast.  . mirtazapine (REMERON) 15 MG tablet Take 7.5 mg by mouth at bedtime. TAKE 1/2 TABLET BY MOUTH AT BEDTIME.  . naproxen (NAPROSYN) 500 MG tablet Take 500 mg by mouth 2 (two) times daily with a meal.  . nitroGLYCERIN (NITROSTAT) 0.4 MG SL tablet Place 0.4 mg under the tongue every 5 (five) minutes as needed for chest pain.  . pantoprazole (PROTONIX) 20 MG tablet Take 20 mg by mouth daily.  . ranolazine (RANEXA) 1000 MG SR tablet Take 1 tablet (1,000 mg total) by mouth 2 (two) times daily.  . traMADol (ULTRAM) 50 MG tablet Take by mouth daily  as needed for moderate pain.   No facility-administered encounter medications on file as of 08/06/2018.     Allergies as of 08/06/2018  . (No Known Allergies)    Past Medical History:  Diagnosis Date  . At risk for falls   . CAD (coronary artery disease)   . GERD (gastroesophageal reflux disease)   . Hypercholesterolemia   . Hypertension   . Hypothyroidism   . Major depression   . Musculoskeletal back pain    CHRONIC LEFT-SIDED DISCOMFORT  . Obstructive sleep apnea on CPAP   . Osteopenia   . PVD (peripheral vascular disease) (HCC)   . Stable angina (HCC)   . Vitamin D deficiency     Past Surgical History:  Procedure Laterality Date  . ANKLE SURGERY Left   . CARDIAC CATHETERIZATION  06/2008   PCI to LAD with 3.0 x 12 mm DES  . SPINAL FUSION      Family History  Problem Relation Age of Onset  . Heart disease Mother   . Asthma Father   . Heart disease Brother [id number]       CAD    Social History   Socioeconomic History  . Marital status: Widowed    Spouse name: Not on file  . Number of children: Not on file  . Years of education: Not on file  . Highest education level: Not  on file  Occupational History  . Not on file  Social Needs  . Financial resource strain: Not on file  . Food insecurity:    Worry: Not on file    Inability: Not on file  . Transportation needs:    Medical: Not on file    Non-medical: Not on file  Tobacco Use  . Smoking status: Never Smoker  . Smokeless tobacco: Never Used  Substance and Sexual Activity  . Alcohol use: Never    Frequency: Never  . Drug use: Never  . Sexual activity: Not on file  Lifestyle  . Physical activity:    Days per week: Not on file    Minutes per session: Not on file  . Stress: Not on file  Relationships  . Social connections:    Talks on phone: Not on file    Gets together: Not on file    Attends religious service: Not on file    Active member of club or organization: Not on file    Attends meetings of  clubs or organizations: Not on file    Relationship status: Not on file  . Intimate partner violence:    Fear of current or ex partner: Not on file    Emotionally abused: Not on file    Physically abused: Not on file    Forced sexual activity: Not on file  Other Topics Concern  . Not on file  Social History Narrative  . Not on file    Review of Systems  Constitutional: Negative.   HENT: Negative.   Respiratory: Positive for shortness of breath.   Cardiovascular: Negative.  Negative for chest pain.  Genitourinary: Negative.     Vitals:   08/06/18 1119  BP: 108/72  Pulse: 69  SpO2: 95%     Physical Exam  Constitutional: She is oriented to person, place, and time. She appears well-developed and well-nourished.  HENT:  Head: Normocephalic and atraumatic.  Eyes: Pupils are equal, round, and reactive to light. Conjunctivae and EOM are normal. Right eye exhibits no discharge. Left eye exhibits no discharge.  Neck: Normal range of motion. No thyromegaly present.  Cardiovascular: Normal rate and regular rhythm.  Pulmonary/Chest: Effort normal. No respiratory distress. She has no wheezes. She has no rales.  Abdominal: Soft. Bowel sounds are normal. She exhibits no distension. There is no tenderness.  Musculoskeletal: Normal range of motion. She exhibits no edema or deformity.  Neurological: She is alert and oriented to person, place, and time. She has normal reflexes. No cranial nerve deficit.  Skin: Skin is warm and dry. No erythema.  Psychiatric: She has a normal mood and affect.   Data Reviewed: Recent sleep study does indicate presence of obstructive sleep apnea Chest x-ray with atelectasis, hernia--no mass lesions or infiltrate been reviewed by myself-8/15  Assessment:   Obstructive sleep apnea There is oxygen desaturations noted on a sleep study Pulmonary hypertension  Plan/Recommendations:  Concern about the machine that is not working properly at present  We  will get patient in touch with a DME company  We will initiate auto titrating CPAP with a setting of 5-15  She may require oxygen supplementation with the machine  We will order an overnight oximetry following initiation of CPAP therapy  I did place a call to Dr. Avie Arenas and waiting for callback  Obtain overnight oximetry while on CPAP    Virl Diamond MD [facility] 08/06/2018, 11:30 AM  CC: Jennifer Bastos, MD

## 2018-08-06 NOTE — Patient Instructions (Addendum)
Obstructive sleep apnea  Pulmonary hypertension   Current machine is dysfunctional   Will refer to DME  Auto titrating CPAP pressure setting of 5-15  Follow-up with compliance data in about 6 weeks  I did place a call to your doctor Dr. Avie ArenasKirtane, Shirish--waiting for callback This is regarding the abnormality that you stated was found on a scan recently

## 2018-08-08 DIAGNOSIS — R5383 Other fatigue: Secondary | ICD-10-CM | POA: Diagnosis not present

## 2018-08-08 NOTE — Procedures (Signed)
Patient Name: Jennifer [name], Jennifer [name] Date: 07/22/2018 Gender: Female D.O.B: [birth year] Age (years): 30 Referring Provider: Izetta Dakin NP Height (inches): 60 Interpreting Physician: Jetty Duhamel MD, ABSM Weight (lbs): 173 RPSGT: [place], [place] BMI: 34 MRN: [id number] Neck Size: 13.75  CLINICAL INFORMATION Sleep Study Type: NPSG Indication for sleep study: Fatigue Epworth Sleepiness Score: 7  SLEEP STUDY TECHNIQUE As per the AASM Manual for the Scoring of Sleep and Associated Events v2.3 (April 2016) with a hypopnea requiring 4% desaturations.  The channels recorded and monitored were frontal, central and occipital EEG, electrooculogram (EOG), submentalis EMG (chin), nasal and oral airflow, thoracic and abdominal wall motion, anterior tibialis EMG, snore microphone, electrocardiogram, and pulse oximetry.  MEDICATIONS Medications self-administered by patient taken the night of the study : none reported  SLEEP ARCHITECTURE The study was initiated at 10:01:43 PM and ended at 5:05:42 AM.  Sleep onset time was 9.7 minutes and the sleep efficiency was 83.1%%. The total sleep time was 352.3 minutes.  Stage REM latency was 78.5 minutes.  The patient spent 26.7%% of the night in stage N1 sleep, 53.7%% in stage N2 sleep, 4.3%% in stage N3 and 15.3% in REM.  Alpha intrusion was absent.  Supine sleep was 83.96%.  RESPIRATORY PARAMETERS The overall apnea/hypopnea index (AHI) was 4.9 per hour. There were 1 total apneas, including 1 obstructive, 0 central and 0 mixed apneas. There were 28 hypopneas and 51 RERAs.  The AHI during Stage REM sleep was 20.0 per hour.  AHI while supine was 5.7 per hour.  The mean oxygen saturation was 89.4%. The minimum SpO2 during sleep was 73.0%.  soft snoring was noted during this study.  CARDIAC DATA The 2 lead EKG demonstrated sinus rhythm. The mean heart rate was 55.7 beats per minute. Other EKG findings include: None.  LEG MOVEMENT  DATA The total PLMS were 0 with a resulting PLMS index of 0.0. Associated arousal with leg movement index was 0.0 .  IMPRESSIONS - Occasional obstructive sleep apnea events occurred during this study (AHI = 4.9/h), not quite meeting standard diagnostic threshold of 5/ hr. During REM her AHI was 20/ hr, and while sleeping supine was 5.7/ hr. Clinical correlation is advised.  - No significant central sleep apnea occurred during this study (CAI = 0.0/h). - Moderate oxygen desaturation was noted during this study (Min O2 = 73.0%, Mean 89.4%). Total time with saturation - The patient snored with soft snoring volume. - No cardiac abnormalities were noted during this study. - Clinically significant periodic limb movements did not occur during sleep. No significant associated arousals. -  DIAGNOSIS - Nocturnal Hypoxemia (327.26 [G47.36 ICD-10]) - Other sleep disordered breathing.  RECOMMENDATIONS - Consider managing as OSA, including possible CPAP if co-morbidities justify. Otherwise suggest managing as nocturnal hypoxemia, with supplemental O2 during sleep. - Be careful with alcohol, sedatives and other CNS depressants that may worsen sleep apnea and disrupt normal sleep architecture. - Sleep hygiene should be reviewed to assess factors that may improve sleep quality. - Weight management and regular exercise should be initiated or continued if appropriate.  [Electronically signed] 08/08/2018 04:06 PM  Jetty Duhamel MD, ABSM Diplomate, American Board of Sleep Medicine   NPI: [id number]                           Jetty Duhamel Diplomate, American Board of Sleep Medicine  ELECTRONICALLY SIGNED ON:  08/08/2018, 3:56 PM [facility] PH: [phone]   FX: [phone] ACCREDITED BY THE AMERICAN ACADEMY OF SLEEP MEDICINE

## 2018-08-10 ENCOUNTER — Other Ambulatory Visit: Payer: Self-pay | Admitting: Gerontology

## 2018-08-10 DIAGNOSIS — K449 Diaphragmatic hernia without obstruction or gangrene: Secondary | ICD-10-CM

## 2018-08-10 DIAGNOSIS — S27809A Unspecified injury of diaphragm, initial encounter: Secondary | ICD-10-CM

## 2018-08-13 ENCOUNTER — Other Ambulatory Visit: Payer: Medicare (Managed Care)

## 2018-08-17 ENCOUNTER — Other Ambulatory Visit: Payer: Self-pay | Admitting: Family Medicine

## 2018-08-17 ENCOUNTER — Telehealth: Payer: Self-pay | Admitting: Physician Assistant

## 2018-08-17 ENCOUNTER — Ambulatory Visit
Admission: RE | Admit: 2018-08-17 | Discharge: 2018-08-17 | Disposition: A | Discharge status: Home / Self Care | Payer: Medicare (Managed Care) | Source: Ambulatory Visit | Attending: Family Medicine | Admitting: Family Medicine

## 2018-08-17 DIAGNOSIS — R42 Dizziness and giddiness: Secondary | ICD-10-CM

## 2018-08-17 DIAGNOSIS — M25512 Pain in left shoulder: Secondary | ICD-10-CM

## 2018-08-17 DIAGNOSIS — W19XXXA Unspecified fall, initial encounter: Secondary | ICD-10-CM

## 2018-08-17 NOTE — Telephone Encounter (Signed)
New Message:     Izetta Dakin ( APP)  at [facility] is requesting a call back [phone] Ext [phone]

## 2018-08-17 NOTE — Telephone Encounter (Signed)
I returned call to [facility] though no answer. I will try again later.

## 2018-08-17 NOTE — Telephone Encounter (Signed)
I will route phone note to [name], PA so that he may document after s/w Izetta Dakin, NP at [facility] in regards to mutual pt.

## 2018-08-18 NOTE — Telephone Encounter (Signed)
I spoke with Izetta Dakin, PA-C at [place]. The patient was recently seen for dizziness and falling.  She seems to have noted dizziness since increasing her Ranolazine.  Her BP medications were adjusted for low BP. PLAN:  1. I recommended decreasing her Ranolazine back to 500 mg Twice daily  2. A follow up appointment will be arranged. [name], PA-C    08/18/2018 5:14 PM

## 2018-09-10 ENCOUNTER — Ambulatory Visit (INDEPENDENT_AMBULATORY_CARE_PROVIDER_SITE_OTHER): Payer: Medicare (Managed Care) | Admitting: Physician Assistant

## 2018-09-10 ENCOUNTER — Encounter: Payer: Self-pay | Admitting: Physician Assistant

## 2018-09-10 VITALS — BP 130/82 | HR 70 | Ht 60.0 in | Wt 178.4 lb

## 2018-09-10 DIAGNOSIS — R42 Dizziness and giddiness: Secondary | ICD-10-CM | POA: Diagnosis not present

## 2018-09-10 DIAGNOSIS — R002 Palpitations: Secondary | ICD-10-CM

## 2018-09-10 DIAGNOSIS — I25119 Atherosclerotic heart disease of native coronary artery with unspecified angina pectoris: Secondary | ICD-10-CM | POA: Diagnosis not present

## 2018-09-10 DIAGNOSIS — I10 Essential (primary) hypertension: Secondary | ICD-10-CM | POA: Diagnosis not present

## 2018-09-10 DIAGNOSIS — I272 Pulmonary hypertension, unspecified: Secondary | ICD-10-CM | POA: Diagnosis not present

## 2018-09-10 NOTE — Patient Instructions (Signed)
Medication Instructions:  Your physician recommends that you continue on your current medications as directed. Please refer to the Current Medication list given to you today.  If you need a refill on your cardiac medications before your next appointment, please call your pharmacy.   Lab work: None ordered  If you have labs (blood work) drawn today and your tests are completely normal, you will receive your results only by: Marland Kitchen MyChart Message (if you have MyChart) OR . A paper copy in the mail If you have any lab test that is abnormal or we need to change your treatment, we will call you to review the results.  Testing/Procedures: Your physician has recommended that you wear an event monitor. Event monitors are medical devices that record the heart's electrical activity. Doctors most often [place] these monitors to diagnose arrhythmias. Arrhythmias are problems with the speed or rhythm of the heartbeat. The monitor is a small, portable device. You can wear one while you do your normal daily activities. This is usually used to diagnose what is causing palpitations/syncope (passing out).    Follow-Up: At [facility], you and your health needs are our priority.  As part of our continuing mission to provide you with exceptional heart care, we have created designated Provider Care Teams.  These Care Teams include your primary Cardiologist (physician) and Advanced Practice Providers (APPs -  Physician Assistants and Nurse Practitioners) who all work together to provide you with the care you need, when you need it. You will need a follow up appointment in 2 months.  Please call our office 2 months in advance to schedule this appointment.  You may see Dr. Cristal Deer  or one of the following Advanced Practice Providers on your designated Care Team:   Theodore Demark, PA-C . Joni Reining, DNP, ANP  Any Other Special Instructions Will Be Listed Below (If Applicable).  Cardiac Event Monitoring A  cardiac event monitor is a small recording device that is used to detect abnormal heart rhythms (arrhythmias). The monitor is used to record your heart rhythm when you have symptoms, such as:  Fast heartbeats (palpitations), such as heart racing or fluttering.  Dizziness.  Fainting or light-headedness.  Unexplained weakness.  Some monitors are wired to electrodes placed on your chest. Electrodes are flat, sticky disks that attach to your skin. Other monitors may be hand-held or worn on the wrist. The monitor can be worn for up to 30 days. If the monitor is attached to your chest, a technician will prepare your chest for the electrode placement and show you how to work the monitor. Take time to practice using the monitor before you leave the office. Make sure you understand how to send the information from the monitor to your health care provider. In some cases, you may need to use a landline telephone instead of a cell phone. What are the risks? Generally, this device is safe to use, but it possible that the skin under the electrodes will become irritated. How to use your cardiac event monitor  Wear your monitor at all times, except when you are in water: ? Do not let the monitor get wet. ? Take the monitor off when you bathe. Do not swim or use a hot tub with it on.  Keep your skin clean. Do not put body lotion or moisturizer on your chest.  Change the electrodes as told by your health care provider or any time they stop sticking to your skin. You may need to use  medical tape to keep them on.  Try to put the electrodes in slightly different places on your chest to help prevent skin irritation. They must remain in the area under your left breast and in the upper right section of your chest.  Make sure the monitor is safely clipped to your clothing or in a location close to your body that your health care provider recommends.  Press the button to record as soon as you feel heart-related  symptoms, such as: ? Dizziness. ? Weakness. ? Light-headedness. ? Palpitations. ? Thumping or pounding in your chest. ? Shortness of breath. ? Unexplained weakness.  Keep a diary of your activities, such as walking, doing chores, and taking medicine. It is very important to note what you were doing when you pushed the button to record your symptoms. This will help your health care provider determine what might be contributing to your symptoms.  Send the recorded information as recommended by your health care provider. It may take some time for your health care provider to process the results.  Change the batteries as told by your health care provider.  Keep electronic devices away from your monitor. This includes: ? Tablets. ? MP3 players. ? Cell phones.  While wearing your monitor you should avoid: ? Electric blankets. ? Firefighter. ? Electric toothbrushes. ? Microwave ovens. ? Magnets. ? Metal detectors. Get help right away if:  You have chest pain.  You have extreme difficulty breathing or shortness of breath.  You develop a very fast heartbeat that persists.  You develop dizziness that does not go away.  You faint or constantly feel like you are about to faint. Summary  A cardiac event monitor is a small recording device that is used to help detect abnormal heart rhythms (arrhythmias).  The monitor is used to record your heart rhythm when you have heart-related symptoms.  Make sure you understand how to send the information from the monitor to your health care provider.  It is important to press the button on the monitor when you have any heart-related symptoms.  Keep a diary of your activities, such as walking, doing chores, and taking medicine. It is very important to note what you were doing when you pushed the button to record your symptoms. This will help your health care provider learn what might be causing your symptoms. This information is not  intended to replace advice given to you by your health care provider. Make sure you discuss any questions you have with your health care provider. Document Released: 09/02/2008 Document Revised: 11/08/2016 Document Reviewed: 11/08/2016 Elsevier Interactive Patient Education  2017 [employer].

## 2018-09-10 NOTE — Progress Notes (Signed)
Cardiology Office Note    Date:  09/10/2018   ID:  Jennifer [name], DOB 03/16/1934, MRN [id number]  PCP:  Jethro Bastos, MD  Cardiologist:  Dr. Okey Dupre  Chief Complaint: dizziness follow up   History of Present Illness:   Jennifer [name] is a 84 y.o. female with coronary artery disease with stable angina, hypertension, hyperlipidemia, sleep apnea, acid reflux with history of hiatal hernia and esophageal stricture presents for follow up.   She established with Dr. Okey Dupre in June 2019.  She previously lived in [place] and underwent PCI with drug-eluting stent to the LAD in 2009.  She has chronic exertional angina.   She reported symptoms of accelerating angina as well as dyspnea with exertion.  She had a heart catheterization 2014 that demonstrated a patent stent in the LAD and jailed diagonal.  There was no significant stenosis in the LCx RCA.  She did have a mean PA pressure on right heart catheterization of 31.  Nuclear stress test in 2018 demonstrated no ischemia.  Echocardiogram in 2018 demonstrated normal LV function and RVSP 26.   Nuclear stress test 06/2018 was obtained and demonstrated breast attenuation artifact but no ischemia.  The study was low risk.  An echocardiogram 06/2018 was obtained and demonstrated normal LV function with normal diastolic function and moderate pulmonary hypertension (PASP 55).  She was seen by [facility] 07/14/18. She was doing well without recurrent angina on higher dose of Renexa. Referred to pulmonary to pulmonary hypertension.   Seen in ER 08/05/18 for mechanical fall without LOC. Discharged. 08/18/18 Renexa cut back on 500mg  BID for dizziness.   She is here for follow up with 2 daughters. Her dizziness has been improved but did not resolved. Its been intermittent, occurs with activity as well as rest.  She has felt skipped beat as well as heart racing.  Both intermittent lasting for few minutes to hours.  Not typically associated with dizziness.  Denies  chest pain, orthopnea, PND, syncope, lower extremity edema or melena.  Compliant with medication.  Past Medical History:  Diagnosis Date  . At risk for falls   . CAD (coronary artery disease)   . GERD (gastroesophageal reflux disease)   . Hypercholesterolemia   . Hypertension   . Hypothyroidism   . Major depression   . Musculoskeletal back pain    CHRONIC LEFT-SIDED DISCOMFORT  . Obstructive sleep apnea on CPAP   . Osteopenia   . PVD (peripheral vascular disease) (HCC)   . Stable angina (HCC)   . Vitamin D deficiency     Past Surgical History:  Procedure Laterality Date  . ANKLE SURGERY Left   . CARDIAC CATHETERIZATION  06/2008   PCI to LAD with 3.0 x 12 mm DES  . SPINAL FUSION      Current Medications:  Prior to Admission medications   Medication Sig Start Date End Date Taking? Authorizing Provider  acetaminophen (TYLENOL) 325 MG tablet Take 650 mg by mouth every 12 (twelve) hours.   Yes [provider]  aspirin EC 81 MG tablet Take 81 mg by mouth daily. CARDIAC EVENT PREVENTION   Yes [provider]  atorvastatin (LIPITOR) 20 MG tablet Take 20 mg by mouth daily.   Yes [provider]  Cholecalciferol (VITAMIN D3) 5000 units TABS Take 1 tablet by mouth daily with breakfast.   Yes [provider]  enalapril (VASOTEC) 20 MG tablet Take 20 mg by mouth daily.   Yes [provider]  furosemide (LASIX)  20 MG tablet Take 20 mg by mouth daily.   Yes [provider]  levothyroxine (SYNTHROID, LEVOTHROID) 50 MCG tablet Take 50 mcg by mouth daily before breakfast.   Yes [provider]  mirtazapine (REMERON) 15 MG tablet Take 7.5 mg by mouth at bedtime. TAKE 1/2 TABLET BY MOUTH AT BEDTIME.   Yes [provider]  nitroGLYCERIN (NITROSTAT) 0.4 MG SL tablet Place 0.4 mg under the tongue every 5 (five) minutes as needed for chest pain.   Yes [provider]  pantoprazole (PROTONIX) 20 MG tablet Take 20 mg by  mouth daily.   Yes [provider]  ranolazine (RANEXA) 500 MG 12 hr tablet Take 500 mg by mouth 2 (two) times daily.   Yes [provider]    Allergies:   Patient has no known allergies.   Social History   Socioeconomic History  . Marital status: Widowed    Spouse name: Not on file  . Number of children: Not on file  . Years of education: Not on file  . Highest education level: Not on file  Occupational History  . Not on file  Social Needs  . Financial resource strain: Not on file  . Food insecurity:    Worry: Not on file    Inability: Not on file  . Transportation needs:    Medical: Not on file    Non-medical: Not on file  Tobacco Use  . Smoking status: Never Smoker  . Smokeless tobacco: Never Used  Substance and Sexual Activity  . Alcohol use: Never    Frequency: Never  . Drug use: Never  . Sexual activity: Not on file  Lifestyle  . Physical activity:    Days per week: Not on file    Minutes per session: Not on file  . Stress: Not on file  Relationships  . Social connections:    Talks on phone: Not on file    Gets together: Not on file    Attends religious service: Not on file    Active member of club or organization: Not on file    Attends meetings of clubs or organizations: Not on file    Relationship status: Not on file  Other Topics Concern  . Not on file  Social History Narrative  . Not on file     Family History:  The patient's family history includes Asthma in her father; Heart disease in her mother; Heart disease (age of onset: 46) in her brother.   ROS:   Please see the history of present illness.    ROS All other systems reviewed and are negative.   PHYSICAL EXAM:   VS:  BP 130/82   Pulse 70   Ht 5' (1.524 m)   Wt 80.9 kg   SpO2 99%   BMI 34.84 kg/m    GEN: Well nourished, well developed, in no acute distress  HEENT: normal  Neck: no JVD, carotid bruits, or masses Cardiac: Irregular; no murmurs, rubs, or gallops,no  edema  Respiratory:  clear to auscultation bilaterally, normal work of breathing GI: soft, nontender, nondistended, + BS MS: no deformity or atrophy  Skin: warm and dry, no rash Neuro:  Alert and Oriented x 3, Strength and sensation are intact Psych: euthymic mood, full affect  Wt Readings from Last 3 Encounters:  09/10/18 80.9 kg  08/06/18 80.7 kg  07/22/18 78.5 kg      Studies/Labs Reviewed:   EKG:  EKG is ordered today.  The ekg ordered  today demonstrates sinus rhythm at rate of 70 bpm  Recent Labs: 06/04/2018: ALT 10 06/17/2018: BUN 20; Creatinine, Ser 1.00; Hemoglobin 12.2; Platelets 249; Potassium 4.0; Sodium 141   Lipid Panel    Component Value Date/Time   CHOL 124 06/04/2018 0956   TRIG 56 06/04/2018 0956   HDL 66 06/04/2018 0956   CHOLHDL 1.9 06/04/2018 0956   LDLCALC 47 06/04/2018 0956    Additional studies/ records that were reviewed today include:   As summarized above    ASSESSMENT & PLAN:    1. Dizziness Her symptoms improved but did not resolve after reducing Ranexa to 500 mg twice daily.  Recent CT of head without acute changes.  Her symptoms been intermittent.  She has had felt skipped beat and heart racing.  During my initial evaluation her rhythm was irregular however during EKG afterwards she was in sinus rhythm.  Will check 30-day event monitor to rule out underlying rhythm issue.  She was not orthostatic by vitals today.  Advised to get balance.  She is undergoing work-up with PCP.  She will benefit from PT/OT. Currently she lives at independent living facility at [facility].   2.  Hypertension -Blood pressure stable on current medication  3. CAD - no anginal pain. Continue current therapy.     Medication Adjustments/Labs and Tests Ordered: Current medicines are reviewed at length with the patient today.  Concerns regarding medicines are outlined above.  Medication changes, Labs and Tests ordered today are listed in the Patient Instructions  below. There are no Patient Instructions on file for this visit.   Lorelei Pont, [place]  09/10/2018 1:24 PM    [facility] [address] [place], [place], [place]  [address] Phone: [phone]; Fax: [phone]

## 2018-09-13 NOTE — Addendum Note (Signed)
Addended by: Adele Schilder on: 09/13/2018 10:46 AM   Modules accepted: Orders

## 2018-09-14 ENCOUNTER — Ambulatory Visit (INDEPENDENT_AMBULATORY_CARE_PROVIDER_SITE_OTHER): Payer: Medicare (Managed Care) | Admitting: Pulmonary Disease

## 2018-09-14 ENCOUNTER — Encounter: Payer: Self-pay | Admitting: Pulmonary Disease

## 2018-09-14 VITALS — BP 116/80 | HR 64 | Ht 60.0 in | Wt 178.4 lb

## 2018-09-14 DIAGNOSIS — R0602 Shortness of breath: Secondary | ICD-10-CM

## 2018-09-14 DIAGNOSIS — R0789 Other chest pain: Secondary | ICD-10-CM

## 2018-09-14 NOTE — Progress Notes (Signed)
Jennifer [name]    [id number]    1934-10-10  Primary Care Physician:Koehler, Marvis Repress, MD  Referring Physician: Jethro Bastos, MD [address] [place], [place] [address]  Chief complaint:   Patient with a history of obstructive sleep apnea  History of pulmonary hypertension Chest discomfort, shortness of breath  HPI:  Patient recently moved here from [place] She was following up with Dr. Luiz Blare -I did speak with Dr. Ronney Asters had had a previously abnormal CT showing some atelectasis that he wanted follow-up for.  She has a history of moderate pulmonary hypertension Has been on CPAP for few years-compliance data shows poor compliance although she states is been using it regularly Coughing is better Still short of breath with activity  Discussed the need to use a CPAP on a regular basis, oxygen supplementation on a regular basis   Outpatient Encounter Medications as of 09/14/2018  Medication Sig  . acetaminophen (TYLENOL) 325 MG tablet Take 650 mg by mouth every 12 (twelve) hours.  Marland Kitchen aspirin EC 81 MG tablet Take 81 mg by mouth daily. CARDIAC EVENT PREVENTION  . atorvastatin (LIPITOR) 20 MG tablet Take 20 mg by mouth daily.  . Cholecalciferol (VITAMIN D3) 5000 units TABS Take 1 tablet by mouth daily with breakfast.  . enalapril (VASOTEC) 20 MG tablet Take 20 mg by mouth daily.  . furosemide (LASIX) 20 MG tablet Take 20 mg by mouth daily.  Marland Kitchen levothyroxine (SYNTHROID, LEVOTHROID) 50 MCG tablet Take 50 mcg by mouth daily before breakfast.  . mirtazapine (REMERON) 15 MG tablet Take 7.5 mg by mouth at bedtime. TAKE 1/2 TABLET BY MOUTH AT BEDTIME.  . nitroGLYCERIN (NITROSTAT) 0.4 MG SL tablet Place 0.4 mg under the tongue every 5 (five) minutes as needed for chest pain.  . pantoprazole (PROTONIX) 20 MG tablet Take 20 mg by mouth daily.  . ranolazine (RANEXA) 500 MG 12 hr tablet Take 500 mg by mouth 2 (two) times daily.   No facility-administered encounter  medications on file as of 09/14/2018.     Allergies as of 09/14/2018  . (No Known Allergies)    Past Medical History:  Diagnosis Date  . At risk for falls   . CAD (coronary artery disease)   . GERD (gastroesophageal reflux disease)   . Hypercholesterolemia   . Hypertension   . Hypothyroidism   . Major depression   . Musculoskeletal back pain    CHRONIC LEFT-SIDED DISCOMFORT  . Obstructive sleep apnea on CPAP   . Osteopenia   . PVD (peripheral vascular disease) (HCC)   . Stable angina (HCC)   . Vitamin D deficiency     Past Surgical History:  Procedure Laterality Date  . ANKLE SURGERY Left   . CARDIAC CATHETERIZATION  06/2008   PCI to LAD with 3.0 x 12 mm DES  . SPINAL FUSION      Family History  Problem Relation Age of Onset  . Heart disease Mother   . Asthma Father   . Heart disease Brother 72       CAD    Social History   Socioeconomic History  . Marital status: Widowed    Spouse name: Not on file  . Number of children: Not on file  . Years of education: Not on file  . Highest education level: Not on file  Occupational History  . Not on file  Social Needs  . Financial resource strain: Not on file  . Food insecurity:  Worry: Not on file    Inability: Not on file  . Transportation needs:    Medical: Not on file    Non-medical: Not on file  Tobacco Use  . Smoking status: Never Smoker  . Smokeless tobacco: Never Used  Substance and Sexual Activity  . Alcohol use: Never    Frequency: Never  . Drug use: Never  . Sexual activity: Not on file  Lifestyle  . Physical activity:    Days per week: Not on file    Minutes per session: Not on file  . Stress: Not on file  Relationships  . Social connections:    Talks on phone: Not on file    Gets together: Not on file    Attends religious service: Not on file    Active member of club or organization: Not on file    Attends meetings of clubs or organizations: Not on file    Relationship status: Not on  file  . Intimate partner violence:    Fear of current or ex partner: Not on file    Emotionally abused: Not on file    Physically abused: Not on file    Forced sexual activity: Not on file  Other Topics Concern  . Not on file  Social History Narrative  . Not on file    Review of Systems  Constitutional: Negative.   HENT: Negative.  Negative for congestion.   Respiratory: Positive for shortness of breath.   Cardiovascular: Positive for chest pain.       Chest wall discomfort  Endocrine: Negative.   Genitourinary: Negative.   Allergic/Immunologic: Negative.   Neurological: Negative.     Vitals:   09/14/18 0919  BP: 116/80  Pulse: 64  SpO2: 95%     Physical Exam  Constitutional: She appears well-developed and well-nourished.  HENT:  Head: Normocephalic and atraumatic.  Eyes: Pupils are equal, round, and reactive to light. Conjunctivae and EOM are normal. Right eye exhibits no discharge. Left eye exhibits no discharge.  Neck: Normal range of motion. No thyromegaly present.  Cardiovascular: Normal rate and regular rhythm.  Pulmonary/Chest: Effort normal. No respiratory distress. She has no wheezes. She has no rales.  Neurological:  Uses a walker   Data Reviewed: Recent sleep study does indicate presence of obstructive sleep apnea Chest x-ray with atelectasis, hernia--no mass lesions or infiltrate been reviewed by myself-8/15 Compliance data reveals 53% compliance, AHI of 9.6  Assessment:   Obstructive sleep apnea-on CPAP therapy, poor compliance Compliance data was reviewed with the patient Encouraged improved compliance   There is oxygen desaturations noted on a sleep study Continue oxygen supplementation via CPAP  Pulmonary hypertension Likely multifactorial Related to nocturnal hypoxemia Follow-up echo in a year   Plan/Recommendations:   Encouraged to continue CPAP use on a regular basis Continue use of auto titrating CPAP Follow-up with  compliance  I did speak with Dr. Avie Arenas His concern was atelectasis-we will repeat a CT scan  Encouraged to stay active  Call if any significant concerns Obtain a pulmonary function study to assess for underlying lung disease   Virl Diamond MD [facility] 09/14/2018, 10:00 AM  CC: Jethro Bastos, MD

## 2018-09-14 NOTE — Patient Instructions (Signed)
History of abnormal CT scan-I did speak with your doctor Dr. Kathryne Gin in the past that showed some atelectasis-bottom part of the lung was not expanding well-he wanted a follow-up CT scan to make sure it clears up  Obstructive sleep apnea-on BiPAP--compliance report does show poor compliance  Encourage you to continue using BiPAP on a regular basis with oxygen supplementation to the BiPAP  We will repeat CT scan of the chest without contrast  Obtain pulmonary function study  I will see you back in the office in 3 months  We will update you on CT scan of the chest findings

## 2018-09-16 ENCOUNTER — Ambulatory Visit (INDEPENDENT_AMBULATORY_CARE_PROVIDER_SITE_OTHER): Payer: Medicare (Managed Care)

## 2018-09-16 ENCOUNTER — Telehealth: Payer: Self-pay | Admitting: Pulmonary Disease

## 2018-09-16 DIAGNOSIS — R42 Dizziness and giddiness: Secondary | ICD-10-CM | POA: Diagnosis not present

## 2018-09-16 NOTE — Telephone Encounter (Signed)
Jennifer [name] is calling back  [phone]

## 2018-09-16 NOTE — Telephone Encounter (Signed)
Sonya returned call, CB is [phone].

## 2018-09-16 NOTE — Telephone Encounter (Signed)
Called and spoke with Jennifer [name], she stated that she was wanting to know when the patient was schedule for the CT. Advised her that we have not scheduled it yet but would call the patient once she was. Nothing further needed.

## 2018-09-16 NOTE — Telephone Encounter (Signed)
Attempted to call Sonya with [facility]. I did not receive an answer. I have left a message for Lamar Laundry to return our call.

## 2018-09-16 NOTE — Telephone Encounter (Signed)
[facility], unable to reach left message to give [place] a call back.

## 2018-09-28 ENCOUNTER — Ambulatory Visit
Admission: RE | Admit: 2018-09-28 | Discharge: 2018-09-28 | Disposition: A | Discharge status: Home / Self Care | Payer: Medicare (Managed Care) | Source: Ambulatory Visit | Attending: Pulmonary Disease | Admitting: Pulmonary Disease

## 2018-09-28 DIAGNOSIS — R0789 Other chest pain: Secondary | ICD-10-CM

## 2018-11-10 ENCOUNTER — Ambulatory Visit (INDEPENDENT_AMBULATORY_CARE_PROVIDER_SITE_OTHER): Payer: Medicare (Managed Care) | Admitting: Cardiology

## 2018-11-10 ENCOUNTER — Encounter: Payer: Self-pay | Admitting: Cardiology

## 2018-11-10 VITALS — BP 122/90 | HR 82 | Ht 59.0 in | Wt 177.4 lb

## 2018-11-10 DIAGNOSIS — Z7189 Other specified counseling: Secondary | ICD-10-CM | POA: Diagnosis not present

## 2018-11-10 DIAGNOSIS — I2510 Atherosclerotic heart disease of native coronary artery without angina pectoris: Secondary | ICD-10-CM | POA: Diagnosis not present

## 2018-11-10 DIAGNOSIS — I10 Essential (primary) hypertension: Secondary | ICD-10-CM

## 2018-11-10 DIAGNOSIS — E782 Mixed hyperlipidemia: Secondary | ICD-10-CM

## 2018-11-10 NOTE — Progress Notes (Signed)
Cardiology Office Note:    Date:  11/10/2018   ID:  Jennifer [name], DOB [birth year], MRN [id number]  PCP:  Jethro Bastos, MD  Cardiologist:  Jodelle Red, MD PhD (Previously Dr. Okey Dupre)  Referring MD: Jethro Bastos, MD   CC: follow up  History of Present Illness:    Jennifer [name] is a 84 y.o. female with a hx of CAD with stable angina, HTN, HLD, sleep apnea, GERD with hiatal hernia/esophageal stricture who is seen as a new patient to me/transfer from Dr. Okey Dupre at the request of Jethro Bastos, MD for the evaluation and management of CAD.  Cardiac history: PCI to LAD in 2009 in [place]. Had repeat cath in 2014 due to accelerating angina, stent patent, jailed diagonal, no intervention. Nuc stress 2018 without ischemic, echo 2018 with normal LV function and RVSP 26 mmHg. She established with Dr. Okey Dupre in 05/2018 after moving from [place]. Nuclear stress done here in 06/2018 showed breast attenuation but no ischemia. Echo 06/2018 showed normal LV function with pHtn (PASP 55). She had her ranexa dose decreased due to dizziness 08/2018. She had palpitations in 09/2018 and had 30 day event monitor ordered, which showed no significant arrhythmias.   Today: Short of breath with walking about a block, unchanged from prior. No chest pain like her angina or syncope. Has chronic LE edema which fluctuates. Uses CPAP at night, does cough with that. Sleeps on one pillow. Doesn't weigh herself routinely. Did have midepigastric sharp pain different than her prior angina a few weeks ago, tried nitro without relief, but propped herself up on pillows and the pain went away. She is being evaluated for worsening GERD/hiatal hernia. No recent illnesses. No falls since the one that brought her to the ER in August.   No palpitations, cannot tell when she has ectopy. Reviewed results of monitor with her.  Her biggest concern is her hiatal hernia, which she was told is getting bigger. Awaiting review by  specialist as she has had prior surgeries for this in the past.   Walks around using walker without issue, takes care of all ADLs at home. No stairs in the house.   Past Medical History:  Diagnosis Date  . At risk for falls   . CAD (coronary artery disease)   . GERD (gastroesophageal reflux disease)   . Hypercholesterolemia   . Hypertension   . Hypothyroidism   . Major depression   . Musculoskeletal back pain    CHRONIC LEFT-SIDED DISCOMFORT  . Obstructive sleep apnea on CPAP   . Osteopenia   . PVD (peripheral vascular disease) (HCC)   . Stable angina (HCC)   . Vitamin D deficiency     Past Surgical History:  Procedure Laterality Date  . ANKLE SURGERY Left   . CARDIAC CATHETERIZATION  06/2008   PCI to LAD with 3.0 x 12 mm DES  . SPINAL FUSION      Current Medications: Current Outpatient Medications on File Prior to Visit  Medication Sig  . acetaminophen (TYLENOL) 325 MG tablet Take 650 mg by mouth every 12 (twelve) hours.  Marland Kitchen aspirin EC 81 MG tablet Take 81 mg by mouth daily. CARDIAC EVENT PREVENTION  . atorvastatin (LIPITOR) 20 MG tablet Take 20 mg by mouth daily.  . Cholecalciferol (VITAMIN D3) 5000 units TABS Take 1 tablet by mouth daily with breakfast.  . enalapril (VASOTEC) 20 MG tablet Take 20 mg by mouth daily.  . furosemide (LASIX) 20 MG tablet Take 20 mg by  mouth daily.  Marland Kitchen levothyroxine (SYNTHROID, LEVOTHROID) 50 MCG tablet Take 50 mcg by mouth daily before breakfast.  . mirtazapine (REMERON) 15 MG tablet Take 7.5 mg by mouth at bedtime. TAKE 1/2 TABLET BY MOUTH AT BEDTIME.  . nitroGLYCERIN (NITROSTAT) 0.4 MG SL tablet Place 0.4 mg under the tongue every 5 (five) minutes as needed for chest pain.  . pantoprazole (PROTONIX) 20 MG tablet Take 20 mg by mouth daily.  . ranolazine (RANEXA) 500 MG 12 hr tablet Take 500 mg by mouth 2 (two) times daily.   No current facility-administered medications on file prior to visit.      Allergies:   Patient has no known  allergies.   Social History   Socioeconomic History  . Marital status: Widowed    Spouse name: Not on file  . Number of children: Not on file  . Years of education: Not on file  . Highest education level: Not on file  Occupational History  . Not on file  Social Needs  . Financial resource strain: Not on file  . Food insecurity:    Worry: Not on file    Inability: Not on file  . Transportation needs:    Medical: Not on file    Non-medical: Not on file  Tobacco Use  . Smoking status: Never Smoker  . Smokeless tobacco: Never Used  Substance and Sexual Activity  . Alcohol use: Never    Frequency: Never  . Drug use: Never  . Sexual activity: Not on file  Lifestyle  . Physical activity:    Days per week: Not on file    Minutes per session: Not on file  . Stress: Not on file  Relationships  . Social connections:    Talks on phone: Not on file    Gets together: Not on file    Attends religious service: Not on file    Active member of club or organization: Not on file    Attends meetings of clubs or organizations: Not on file    Relationship status: Not on file  Other Topics Concern  . Not on file  Social History Narrative  . Not on file     Family History: The patient's family history includes Asthma in her father; Heart disease in her mother; Heart disease (age of onset: 40) in her brother.  ROS:   Please see the history of present illness.  Additional pertinent ROS:  Constitutional: Negative for chills, fever, night sweats, unintentional weight loss  HENT: Negative for ear pain and hearing loss.   Eyes: Negative for loss of vision and eye pain.  Respiratory: Positive for dyspnea on exertion. Negative for cough, sputum, shortness of breath at rest, wheezing.   Cardiovascular: Positive for chronic LE edema. Negative for chest pain, palpitations, PND, orthopnea, and claudication.  Gastrointestinal: Negative for abdominal pain, melena, and hematochezia.    Genitourinary: Negative for dysuria and hematuria.  Musculoskeletal: Negative for falls recently and myalgias.  Skin: Negative for itching and rash.  Neurological: Negative for focal weakness, focal sensory changes and loss of consciousness.  Endo/Heme/Allergies: Does not bruise/bleed easily.    EKGs/Labs/Other Studies Reviewed:    The following studies were reviewed today: Monitor 10/21/18  The patient was enrolled for 30 days. 60% of the monitoring period yielded diagnostic tracings.  The predominant rhythm was sinus with an average rate of 72 bpm (range 51 to 125 bpm).  Isolated PACs and PVCs were noted.  No sustained arrhythmia or prolonged pause was identified.  Predominantly sinus rhythm with isolated PACs and PVCs.  No sustained arrhythmia.  lexiscan 7.11.19  The left ventricular ejection fraction is hyperdynamic (>65%).  Nuclear stress EF: 71%.  There was no ST segment deviation noted during stress.  There is a small defect of mild severity present in the basal anteroseptal, mid anteroseptal and apical septal location. The defect is non-reversible that is consistent with breast attenuation artifact. No ischemia noted.  This is a low risk study.   Echo 06/17/18 - Left ventricle: The cavity size was normal. Wall thickness was   normal. Systolic function was normal. The estimated ejection   fraction was in the range of 60% to 65%. Wall motion was normal;   there were no regional wall motion abnormalities. Left   ventricular diastolic function parameters were normal. - Mitral valve: Calcified annulus. There was mild regurgitation. - Pulmonary arteries: Systolic pressure was moderately increased.   PA peak pressure: 55 mm Hg (S).  Impressions: - Normal LV systolic function; mild MR; trace TR with moderate   pulmonary hypertension.  EKG:  EKG is personally reviewed.  The ekg ordered today demonstrates normal sinus rhythm  Recent Labs: 06/04/2018: ALT  10 06/17/2018: BUN 20; Creatinine, Ser 1.00; Hemoglobin 12.2; Platelets 249; Potassium 4.0; Sodium 141  Recent Lipid Panel    Component Value Date/Time   CHOL 124 06/04/2018 0956   TRIG 56 06/04/2018 0956   HDL 66 06/04/2018 0956   CHOLHDL 1.9 06/04/2018 0956   LDLCALC 47 06/04/2018 0956    Physical Exam:    VS:  BP 122/90   Pulse 82   Ht 4\' 11"  (1.499 m)   Wt 177 lb 6.4 oz (80.5 kg)   BMI 35.83 kg/m     Wt Readings from Last 3 Encounters:  11/10/18 177 lb 6.4 oz (80.5 kg)  09/14/18 178 lb 6.4 oz (80.9 kg)  09/10/18 178 lb 6.4 oz (80.9 kg)     GEN: Well nourished, well developed in no acute distress HEENT: Normal NECK: No JVD at 90 degreees; No carotid bruits LYMPHATICS: No lymphadenopathy CARDIAC: regular rhythm, normal S1 and S2, no murmurs, rubs, gallops. Radial and DP pulses 2+ bilaterally. RESPIRATORY:  Clear to auscultation without rales, wheezing or rhonchi  ABDOMEN: Soft, non-tender, non-distended MUSCULOSKELETAL:  Trace bilateral ankle edema; No deformity  SKIN: Warm and dry NEUROLOGIC:  Alert and oriented x 3 PSYCHIATRIC:  Normal affect   ASSESSMENT:    1. Coronary artery disease involving native coronary artery of native heart without angina pectoris   2. Essential hypertension   3. Mixed hyperlipidemia   4. Counseling on health promotion and disease prevention    PLAN:    1. CAD: denies angina at this time. Tolerating medications well -continue aspirin 81 mg, atorvastatin 20 mg, ranolazine 500 mg BID -has SL NG -counseled on red flag warning signs requiring immediate medical attention -recent echo, stress test in 2019  2. Hypertension: well controlled today, goal <130/80 -continue enalapril, furosemide -had issues with beta blocker in the past  3. Secondary prevention -recommend heart healthy/Mediterranean diet, with whole grains, fruits, vegetable, fish, lean meats, nuts, and olive oil. Limit salt. -recommend avoidance of tobacco products. Avoid  excess alcohol. -Additional risk factor control:  -Hyperlipidemia: LDL goal <70. On atorvastatin  -Blood pressure control: as above  -Weight: BMI 35.8, counseled on diet. Limited ability for exercise given mobility, encouraged to safely do what she can.  Other: was seen by pulmonology for sleep apnea and pulmonary hypertension. She is  unclear re: plans for follow up. Appears euvolemic today.  Plan for follow up: 1 year or sooner PRN  Copy of note will be sent to: [facility] [address]  [place], [place] [id number] Fax [phone]  Medication Adjustments/Labs and Tests Ordered: Current medicines are reviewed at length with the patient today.  Concerns regarding medicines are outlined above.  Orders Placed This Encounter  Procedures  . EKG 12-Lead   No orders of the defined types were placed in this encounter.   Patient Instructions  Medication Instructions:  Your Physician recommend you continue on your current medication as directed.    If you need a refill on your cardiac medications before your next appointment, please call your pharmacy.   Lab work: None  Testing/Procedures: None  Follow-Up: At [facility], you and your health needs are our priority.  As part of our continuing mission to provide you with exceptional heart care, we have created designated Provider Care Teams.  These Care Teams include your primary Cardiologist (physician) and Advanced Practice Providers (APPs -  Physician Assistants and Nurse Practitioners) who all work together to provide you with the care you need, when you need it. You will need a follow up appointment in 1 years.  Please call our office 2 months in advance to schedule this appointment.  You may see Dr. Cristal Deerhristopher or one of the following Advanced Practice Providers on your designated Care Team:   Theodore DemarkRhonda Barrett, PA-C . Joni ReiningKathryn Lawrence, DNP, ANP  .       Signed, Jodelle RedBridgette Arline Ketter, MD PhD 11/10/2018 11:02 AM      [facility]

## 2018-11-10 NOTE — Patient Instructions (Signed)

## 2018-12-13 ENCOUNTER — Ambulatory Visit
Admission: RE | Admit: 2018-12-13 | Discharge: 2018-12-13 | Disposition: A | Discharge status: Home / Self Care | Payer: No Typology Code available for payment source | Source: Ambulatory Visit | Attending: Gerontology | Admitting: Gerontology

## 2018-12-13 ENCOUNTER — Other Ambulatory Visit: Payer: Self-pay | Admitting: Gerontology

## 2018-12-13 DIAGNOSIS — R058 Other specified cough: Secondary | ICD-10-CM

## 2018-12-13 DIAGNOSIS — J040 Acute laryngitis: Secondary | ICD-10-CM

## 2018-12-13 DIAGNOSIS — R05 Cough: Secondary | ICD-10-CM

## 2018-12-23 ENCOUNTER — Ambulatory Visit (INDEPENDENT_AMBULATORY_CARE_PROVIDER_SITE_OTHER): Payer: Medicare (Managed Care) | Admitting: Pulmonary Disease

## 2018-12-23 ENCOUNTER — Encounter: Payer: Self-pay | Admitting: Pulmonary Disease

## 2018-12-23 VITALS — BP 138/86 | HR 68 | Ht 60.0 in | Wt 179.8 lb

## 2018-12-23 DIAGNOSIS — G4733 Obstructive sleep apnea (adult) (pediatric): Secondary | ICD-10-CM | POA: Diagnosis not present

## 2018-12-23 DIAGNOSIS — Z9989 Dependence on other enabling machines and devices: Secondary | ICD-10-CM

## 2018-12-23 MED ORDER — BENZONATATE 200 MG PO CAPS
200.0000 mg | ORAL_CAPSULE | Freq: Three times a day (TID) | ORAL | 1 refills | Status: DC | PRN
Start: 2018-12-23 — End: 2019-05-13

## 2018-12-23 NOTE — Patient Instructions (Signed)
Obstructive sleep apnea Cough affecting tolerance of CPAP  Cough medicine may be used just at night to help relieve symptoms I encourage you to continue using a CPAP  I will see you back here in about 3 months We will repeat your CT scan in October  Call with any significant concerns

## 2018-12-23 NOTE — Progress Notes (Signed)
Jennifer [name]    [id number]    [birth year]  Primary Care Physician:Koehler, Marvis Repress, MD  Referring Physician: Jethro Bastos, MD [address] [place], [place] [address]  Chief complaint:    Patient with a history of obstructive sleep apnea  History of pulmonary hypertension Has some back discomfort, persistent cough following a bronchitis   HPI: Persistent cough following bronchitis Some chronic back pain Does not feel acutely ill at present  Patient recently moved here from [place], did speak with Dr. Avie Arenas regarding a CT Repeat CT significant for some atelectasis  She has a history of moderate pulmonary hypertension Has been on CPAP for few years-compliance data shows poor compliance although she states is been using it regularly Coughing is better Still short of breath with activity  Discussed the need to use a CPAP on a regular basis, oxygen supplementation on a regular basis   Outpatient Encounter Medications as of 12/23/2018  Medication Sig  . acetaminophen (TYLENOL) 325 MG tablet Take 650 mg by mouth every 12 (twelve) hours.  Marland Kitchen aspirin EC 81 MG tablet Take 81 mg by mouth daily. CARDIAC EVENT PREVENTION  . atorvastatin (LIPITOR) 20 MG tablet Take 20 mg by mouth daily.  . Cholecalciferol (VITAMIN D3) 5000 units TABS Take 1 tablet by mouth daily with breakfast.  . enalapril (VASOTEC) 20 MG tablet Take 20 mg by mouth daily.  . furosemide (LASIX) 20 MG tablet Take 20 mg by mouth daily.  Marland Kitchen levothyroxine (SYNTHROID, LEVOTHROID) 50 MCG tablet Take 50 mcg by mouth daily before breakfast.  . mirtazapine (REMERON) 15 MG tablet Take 7.5 mg by mouth at bedtime. TAKE 1/2 TABLET BY MOUTH AT BEDTIME.  . nitroGLYCERIN (NITROSTAT) 0.4 MG SL tablet Place 0.4 mg under the tongue every 5 (five) minutes as needed for chest pain.  . pantoprazole (PROTONIX) 20 MG tablet Take 20 mg by mouth daily.  . ranolazine (RANEXA) 500 MG 12 hr tablet Take 500 mg by mouth 2 (two)  times daily.   No facility-administered encounter medications on file as of 12/23/2018.     Allergies as of 12/23/2018  . (No Known Allergies)    Past Medical History:  Diagnosis Date  . At risk for falls   . CAD (coronary artery disease)   . GERD (gastroesophageal reflux disease)   . Hypercholesterolemia   . Hypertension   . Hypothyroidism   . Major depression   . Musculoskeletal back pain    CHRONIC LEFT-SIDED DISCOMFORT  . Obstructive sleep apnea on CPAP   . Osteopenia   . PVD (peripheral vascular disease) (HCC)   . Stable angina (HCC)   . Vitamin D deficiency     Past Surgical History:  Procedure Laterality Date  . ANKLE SURGERY Left   . CARDIAC CATHETERIZATION  06/2008   PCI to LAD with 3.0 x 12 mm DES  . SPINAL FUSION      Family History  Problem Relation Age of Onset  . Heart disease Mother   . Asthma Father   . Heart disease Brother 30       CAD    Social History   Socioeconomic History  . Marital status: Widowed    Spouse name: Not on file  . Number of children: Not on file  . Years of education: Not on file  . Highest education level: Not on file  Occupational History  . Not on file  Social Needs  . Financial resource strain:  Not on file  . Food insecurity:    Worry: Not on file    Inability: Not on file  . Transportation needs:    Medical: Not on file    Non-medical: Not on file  Tobacco Use  . Smoking status: Never Smoker  . Smokeless tobacco: Never Used  Substance and Sexual Activity  . Alcohol use: Never    Frequency: Never  . Drug use: Never  . Sexual activity: Not on file  Lifestyle  . Physical activity:    Days per week: Not on file    Minutes per session: Not on file  . Stress: Not on file  Relationships  . Social connections:    Talks on phone: Not on file    Gets together: Not on file    Attends religious service: Not on file    Active member of club or organization: Not on file    Attends meetings of clubs or  organizations: Not on file    Relationship status: Not on file  . Intimate partner violence:    Fear of current or ex partner: Not on file    Emotionally abused: Not on file    Physically abused: Not on file    Forced sexual activity: Not on file  Other Topics Concern  . Not on file  Social History Narrative  . Not on file    Review of Systems  Constitutional: Negative.   HENT: Negative.  Negative for congestion.   Eyes: Negative.   Respiratory: Positive for cough and shortness of breath.   Cardiovascular: Positive for chest pain.       Chest wall discomfort    There were no vitals filed for this visit.   Physical Exam  Constitutional: She appears well-developed and well-nourished.  HENT:  Head: Normocephalic and atraumatic.  Eyes: Pupils are equal, round, and reactive to light. Conjunctivae and EOM are normal. Right eye exhibits no discharge. Left eye exhibits no discharge.  Neck: Normal range of motion. Neck supple. No thyromegaly present.  Cardiovascular: Normal rate and regular rhythm.  Pulmonary/Chest: Effort normal. No respiratory distress. She has no wheezes. She has no rales.  Abdominal: Soft. Bowel sounds are normal. She exhibits no distension. There is no abdominal tenderness. There is no rebound.  Musculoskeletal:        General: Edema present.  Neurological:  Uses a walker   Data Reviewed: Recent sleep study does indicate presence of obstructive sleep apnea Chest x-ray with atelectasis, hernia--no mass lesions or infiltrate been reviewed by myself-8/15 CT scan with right midlung atelectasis Compliance data reveals 10% compliance, AHI of 7.6  Assessment:   Obstructive sleep apnea-on CPAP therapy, poor compliance Compliance data was reviewed with the patient Encouraged improved compliance   There is oxygen desaturations noted on a sleep study Continue oxygen supplementation via CPAP Unfortunately has been using oxygen supplementation by itself, the  importance of using CPAP on a regular basis was discussed  Pulmonary hypertension Likely multifactorial Related to nocturnal hypoxemia Follow-up echo in a year   Abnormal CT scan of the chest revealing atelectasis -We will repeat in October  Cough following bronchitis  Plan/Recommendations:   Encouraged to continue CPAP use on a regular basis Continue use of auto titrating CPAP Follow-up with compliance  Tessalon Perles for cough  Encouraged to stay active  Call if any significant concerns  I will see her back in the office in about 213-months  Virl DiamondAdewale Nyomi Howser MD [facility] 12/23/2018, 11:02  AM  CC: Jethro BastosKoehler, Robert N, MD

## 2019-03-31 ENCOUNTER — Ambulatory Visit: Payer: Medicare (Managed Care) | Admitting: Pulmonary Disease

## 2019-04-30 ENCOUNTER — Emergency Department (HOSPITAL_COMMUNITY): Payer: Medicare (Managed Care)

## 2019-04-30 ENCOUNTER — Other Ambulatory Visit: Payer: Self-pay

## 2019-04-30 ENCOUNTER — Encounter (HOSPITAL_COMMUNITY): Payer: Self-pay

## 2019-04-30 ENCOUNTER — Emergency Department (HOSPITAL_COMMUNITY)
Admission: EM | Admit: 2019-04-30 | Discharge: 2019-04-30 | Disposition: A | Discharge status: Home / Self Care | Payer: Medicare (Managed Care) | Attending: Emergency Medicine | Admitting: Emergency Medicine

## 2019-04-30 DIAGNOSIS — S52502A Unspecified fracture of the lower end of left radius, initial encounter for closed fracture: Secondary | ICD-10-CM | POA: Diagnosis not present

## 2019-04-30 DIAGNOSIS — Z79899 Other long term (current) drug therapy: Secondary | ICD-10-CM | POA: Diagnosis not present

## 2019-04-30 DIAGNOSIS — I2510 Atherosclerotic heart disease of native coronary artery without angina pectoris: Secondary | ICD-10-CM | POA: Insufficient documentation

## 2019-04-30 DIAGNOSIS — S52602A Unspecified fracture of lower end of left ulna, initial encounter for closed fracture: Secondary | ICD-10-CM | POA: Insufficient documentation

## 2019-04-30 DIAGNOSIS — W010XXA Fall on same level from slipping, tripping and stumbling without subsequent striking against object, initial encounter: Secondary | ICD-10-CM | POA: Insufficient documentation

## 2019-04-30 DIAGNOSIS — Y998 Other external cause status: Secondary | ICD-10-CM | POA: Diagnosis not present

## 2019-04-30 DIAGNOSIS — Z7982 Long term (current) use of aspirin: Secondary | ICD-10-CM | POA: Diagnosis not present

## 2019-04-30 DIAGNOSIS — Y9389 Activity, other specified: Secondary | ICD-10-CM | POA: Diagnosis not present

## 2019-04-30 DIAGNOSIS — E039 Hypothyroidism, unspecified: Secondary | ICD-10-CM | POA: Insufficient documentation

## 2019-04-30 DIAGNOSIS — I10 Essential (primary) hypertension: Secondary | ICD-10-CM | POA: Insufficient documentation

## 2019-04-30 DIAGNOSIS — S59912A Unspecified injury of left forearm, initial encounter: Secondary | ICD-10-CM | POA: Diagnosis present

## 2019-04-30 DIAGNOSIS — S022XXA Fracture of nasal bones, initial encounter for closed fracture: Secondary | ICD-10-CM | POA: Diagnosis not present

## 2019-04-30 DIAGNOSIS — Y92481 Parking lot as the place of occurrence of the external cause: Secondary | ICD-10-CM | POA: Diagnosis not present

## 2019-04-30 NOTE — ED Triage Notes (Signed)
Upon attempting to re-enter car after shopping today, she lost her grip and fell. She c/o pain and swelling of her left wrist/distal forearm area. She has a sm. Superficial scratch at upper bridge of nose area, which she states "was there before I fell". She is awake, alert and oriented x 4 with clear speech. She tells [place] she occasionally uses O2 at home.

## 2019-04-30 NOTE — Discharge Instructions (Addendum)
Elevate your wrist on a pillow to help with swelling.  Do not lift anything or use the left hand.  Take tramadol as you need for the pain.

## 2019-04-30 NOTE — ED Provider Notes (Signed)
[facility]-EMERGENCY DEPT Provider Note   CSN: [id number] Arrival date & time: 04/30/19  1228    History   Chief Complaint Chief Complaint  Patient presents with  . Fall    HPI Jennifer [name] is a 85 y.o. female.     Patient is an 85 year old female with a history of pulmonary hypertension, CAD, PVD who presents with EMS today after a fall at the grocery store.  Patient states she was reaching to open the car door and dust the handle causing her to fall forward onto her left hand and face.  She denies feeling weak, dizzy or lightheaded prior to the incident.  She remembers the entire incident.  She had no loss of consciousness after the fall.  She is complaining of pain in the left wrist with deformity and pain in her face.  She denies any anticoagulation.  She has no pain in her legs, chest pain, shortness of breath or abdominal pain.  She denies any numbness or tingling in the hands.  The history is provided by the patient.  Fall  This is a new problem. The current episode started less than 1 hour ago. The problem occurs constantly. The problem has not changed since onset.Pertinent negatives include no headaches and no shortness of breath. Associated symptoms comments: Wrist and facial pain.. The symptoms are aggravated by bending and twisting. The symptoms are relieved by position. Treatments tried: fentanyl. The treatment provided mild relief.    Past Medical History:  Diagnosis Date  . At risk for falls   . CAD (coronary artery disease)   . GERD (gastroesophageal reflux disease)   . Hypercholesterolemia   . Hypertension   . Hypothyroidism   . Major depression   . Musculoskeletal back pain    CHRONIC LEFT-SIDED DISCOMFORT  . Obstructive sleep apnea on CPAP   . Osteopenia   . PVD (peripheral vascular disease) (HCC)   . Stable angina (HCC)   . Vitamin D deficiency     Patient Active Problem List   Diagnosis Date Noted  . Pulmonary hypertension,  unspecified (HCC) 07/14/2018  . Coronary artery disease involving native coronary artery of native heart with angina pectoris (HCC) 06/05/2018  . Dyspnea on exertion 06/05/2018  . Hyperlipidemia LDL goal <70 06/05/2018  . Essential hypertension 06/05/2018    Past Surgical History:  Procedure Laterality Date  . ANKLE SURGERY Left   . CARDIAC CATHETERIZATION  06/2008   PCI to LAD with 3.0 x 12 mm DES  . SPINAL FUSION       OB History   No obstetric history on file.      Home Medications    Prior to Admission medications   Medication Sig Start Date End Date Taking? Authorizing Provider  acetaminophen (TYLENOL) 325 MG tablet Take 650 mg by mouth every 12 (twelve) hours.    [provider]  aspirin EC 81 MG tablet Take 81 mg by mouth daily. CARDIAC EVENT PREVENTION    [provider]  atorvastatin (LIPITOR) 20 MG tablet Take 20 mg by mouth daily.    [provider]  benzonatate (TESSALON) 200 MG capsule Take 1 capsule (200 mg total) by mouth 3 (three) times daily as needed for cough. 12/23/18   Tomma Lightning, MD  Cholecalciferol (VITAMIN D3) 5000 units TABS Take 1 tablet by mouth daily with breakfast.    [provider]  enalapril (VASOTEC) 20 MG tablet Take 20 mg by mouth daily.    [provider]  furosemide (LASIX) 20 MG tablet Take 20 mg by mouth daily.    [provider]  levothyroxine (SYNTHROID, LEVOTHROID) 50 MCG tablet Take 50 mcg by mouth daily before breakfast.    [provider]  mirtazapine (REMERON) 15 MG tablet Take 7.5 mg by mouth at bedtime. TAKE 1/2 TABLET BY MOUTH AT BEDTIME.    [provider]  nitroGLYCERIN (NITROSTAT) 0.4 MG SL tablet Place 0.4 mg under the tongue every 5 (five) minutes as needed for chest pain.    [provider]  pantoprazole (PROTONIX) 20 MG tablet Take 20 mg by mouth daily.    [provider]  ranolazine (RANEXA) 500 MG 12 hr tablet Take 500 mg by  mouth 2 (two) times daily.    [provider]    Family History Family History  Problem Relation Age of Onset  . Heart disease Mother   . Asthma Father   . Heart disease Brother 1735       CAD    Social History Social History   Tobacco Use  . Smoking status: Never Smoker  . Smokeless tobacco: Never Used  Substance Use Topics  . Alcohol use: Never    Frequency: Never  . Drug use: Never     Allergies   Patient has no known allergies.   Review of Systems Review of Systems  Respiratory: Negative for shortness of breath.   Neurological: Negative for headaches.  All other systems reviewed and are negative.    Physical Exam Updated Vital Signs BP (!) 186/105 (BP Location: Right Arm)   Pulse 63   Temp 97.7 F (36.5 C) (Oral)   Resp 18   SpO2 98%   Physical Exam Vitals signs and nursing note reviewed.  Constitutional:      General: She is not in acute distress.    Appearance: She is well-developed.  HENT:     Head: Normocephalic and atraumatic.      Nose:     Comments: No epistaxis    Mouth/Throat:     Mouth: Mucous membranes are moist.  Eyes:     Conjunctiva/sclera: Conjunctivae normal.     Pupils: Pupils are equal, round, and reactive to light.  Neck:     Musculoskeletal: Normal range of motion and neck supple. No spinous process tenderness or muscular tenderness.  Cardiovascular:     Rate and Rhythm: Normal rate and regular rhythm.     Heart sounds: No murmur.  Pulmonary:     Effort: Pulmonary effort is normal. No respiratory distress.     Breath sounds: Normal breath sounds. No wheezing or rales.  Abdominal:     General: There is no distension.     Palpations: Abdomen is soft.     Tenderness: There is no abdominal tenderness. There is no guarding or rebound.  Musculoskeletal:     Left shoulder: Normal.     Left elbow: Normal.     Left wrist: She exhibits decreased range of motion, tenderness, bony tenderness, swelling and deformity.      Right lower leg: No edema.     Left lower leg: No edema.     Comments: 1+ radial pulse present on the left.  Less than 2-second capillary refill in all fingers.  Patient is able to move all the fingers on the left hand and sensation is intact  Skin:    General: Skin is warm and dry.     Capillary Refill: Capillary refill takes less than 2 seconds.  Findings: No erythema or rash.  Neurological:     General: No focal deficit present.     Mental Status: She is alert and oriented to person, place, and time. Mental status is at baseline.  Psychiatric:        Mood and Affect: Mood normal.        Behavior: Behavior normal.        Thought Content: Thought content normal.      ED Treatments / Results  Labs (all labs ordered are listed, but only abnormal results are displayed) Labs Reviewed - No data to display  EKG None  Radiology Dg Wrist Complete Left  Result Date: 04/30/2019 CLINICAL DATA:  Fall, pain and deformity. EXAM: LEFT WRIST - COMPLETE 3+ VIEW COMPARISON:  None. FINDINGS: Displaced/comminuted fracture of the distal LEFT radius with anterior displacement of the main component of the distal fracture fragment and overriding of the main fracture fragments. Slightly displaced fracture at the base of the ulnar styloid. Carpal bones appear intact. 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 IMPRESSION: 1. Displaced/comminuted fracture of the distal LEFT radius, as detailed above. 2. Slightly displaced fracture at the base of the ulnar styloid. Electronically Signed   By: Bary Richard M.D.   On: 04/30/2019 13:06   Ct Head Wo Contrast  Result Date: 04/30/2019 CLINICAL DATA:  Fall EXAM: CT HEAD WITHOUT CONTRAST TECHNIQUE: Contiguous axial images were obtained from the base of the skull through the vertex without intravenous contrast. COMPARISON:  08/17/2018 FINDINGS: Brain: No evidence of acute infarction, hemorrhage, hydrocephalus, extra-axial collection or mass  lesion/mass effect. Mild periventricular white matter hypodensity. Vascular: No hyperdense vessel or unexpected calcification. Skull: Normal. Negative for fracture or focal lesion. Sinuses/Orbits: No acute finding. Other: Mildly displaced fractures of the left nasal bones (series 4, image 5). IMPRESSION: 1. No acute intracranial pathology. Mild small-vessel white matter disease. 2. Mildly displaced fractures of the left nasal bones (series 4, image 5). Electronically Signed   By: Lauralyn Primes M.D.   On: 04/30/2019 13:17   Ct Maxillofacial Wo Contrast  Result Date: 04/30/2019 CLINICAL DATA:  Facial trauma. EXAM: CT MAXILLOFACIAL WITHOUT CONTRAST TECHNIQUE: Multidetector CT imaging of the maxillofacial structures was performed. Multiplanar CT image reconstructions were also generated. COMPARISON:  None FINDINGS: Osseous: Nondisplaced fracture of the left nasal bone. No other fracture or dislocation. No destructive osseous lesion. Degenerative disc disease with disc height loss at C3-4, C5-6 and C6-7. Orbits: Negative. No traumatic or inflammatory finding. Sinuses: Clear. Soft tissues: No fluid collection or hematoma. Limited intracranial: No significant or unexpected finding. IMPRESSION: 1. Nondisplaced fracture of the left nasal bone. Electronically Signed   By: Elige Ko   On: 04/30/2019 13:26    Procedures Procedures (including critical care time)  Medications Ordered in ED Medications - No data to display   Initial Impression / Assessment and Plan / ED Course  I have reviewed the triage vital signs and the nursing notes.  Pertinent labs & imaging results that were available during my care of the patient were reviewed by me and considered in my medical decision making (see chart for details).        Elderly female presenting today after a fall at the grocery store.  Patient had no presyncopal or dizzy episodes prior to the fall.  She just misjudged when she was trying to open the car door.   She fell on an outstretched hand with deformity and pain in the left wrist.  She is neurovascularly intact  at this time.  She also hit her face on the pavement and has some minimal abrasion and ecchymosis over the nose.  Patient does not take anticoagulation other than aspirin.  CT of the head and face as well as x-ray of the left wrist pending.  Patient received fentanyl in route and with positioning and prior medication she is currently comfortable.  1:18 PM X-ray shows a displaced comminuted fracture of the distal left radius as well as slightly displaced fracture at the base of the ulnar styloid.  Will discuss with hand surgery for recommendations for management.  CT head neg but does have mildly displaced left nasal bone fracture that we will treat conservatively.    2:04 PM Spoke with Dr. Izora Ribas who recommends follow up in the office next week and pt was placed in a posterior short arm splint.  Discussed findings with her and daughters and pt has tramadol at home she would like to take for the pain as needed.    Final Clinical Impressions(s) / ED Diagnoses   Final diagnoses:  Closed fracture of left distal radius and ulna, initial encounter  Closed fracture of nasal bone, initial encounter    ED Discharge Orders    None       Gwyneth Sprout, MD 04/30/19 1412

## 2019-04-30 NOTE — ED Notes (Signed)
Our ortho. Tech. Is here to apply the splint. Pt. Remains in no distress.

## 2019-05-11 ENCOUNTER — Other Ambulatory Visit: Payer: Self-pay | Admitting: Orthopedic Surgery

## 2019-05-12 ENCOUNTER — Other Ambulatory Visit (HOSPITAL_COMMUNITY)
Admission: RE | Admit: 2019-05-12 | Discharge: 2019-05-12 | Disposition: A | Discharge status: Home / Self Care | Payer: Medicare (Managed Care) | Source: Ambulatory Visit | Attending: Orthopedic Surgery | Admitting: Orthopedic Surgery

## 2019-05-12 DIAGNOSIS — Z1159 Encounter for screening for other viral diseases: Secondary | ICD-10-CM | POA: Diagnosis present

## 2019-05-13 ENCOUNTER — Other Ambulatory Visit: Payer: Self-pay

## 2019-05-13 ENCOUNTER — Encounter (HOSPITAL_COMMUNITY): Payer: Self-pay | Admitting: *Deleted

## 2019-05-13 LAB — NOVEL CORONAVIRUS, NAA (HOSP ORDER, SEND-OUT TO REF LAB; TAT 18-24 HRS): SARS-CoV-2, NAA: NOT DETECTED

## 2019-05-13 NOTE — Anesthesia Preprocedure Evaluation (Addendum)
Anesthesia Evaluation  Patient identified by MRN, date of birth, ID band Patient awake    Reviewed: Allergy & Precautions, NPO status , Patient's Chart, lab work & pertinent test results  Airway Mallampati: II  TM Distance: >3 FB Neck ROM: Full    Dental no notable dental hx. (+) Edentulous Upper, Edentulous Lower   Pulmonary sleep apnea ,  Hx of pulm HTN   Pulmonary exam normal breath sounds clear to auscultation       Cardiovascular Exercise Tolerance: Good hypertension, Pt. on medications + CAD and + Peripheral Vascular Disease  Normal cardiovascular exam Rhythm:Regular Rate:Normal     Neuro/Psych PSYCHIATRIC DISORDERS Depression    GI/Hepatic GERD  ,  Endo/Other  Hypothyroidism   Renal/GU      Musculoskeletal   Abdominal   Peds  Hematology   Anesthesia Other Findings   Reproductive/Obstetrics                           Anesthesia Physical Anesthesia Plan  ASA: III  Anesthesia Plan: Regional   Post-op Pain Management:    Induction: Intravenous  PONV Risk Score and Plan: 3 and Treatment may vary due to age or medical condition, Ondansetron and Dexamethasone  Airway Management Planned: Natural Airway and Simple Face Mask  Additional Equipment:   Intra-op Plan:   Post-operative Plan:   Informed Consent: I have reviewed the patients History and Physical, chart, labs and discussed the procedure including the risks, benefits and alternatives for the proposed anesthesia with the patient or authorized representative who has indicated his/her understanding and acceptance.     Dental advisory given  Plan Discussed with: CRNA  Anesthesia Plan Comments: (PAT note written 05/13/2019 by Myra Gianotti, PA-C.  Supraclavicular block w MAC )      Anesthesia Quick Evaluation

## 2019-05-13 NOTE — Progress Notes (Signed)
Anesthesia Chart Review: SAME DAY WORK-UP   Case:  [id number] Date/Time:  05/16/19 1345   Procedure:  OPEN REDUCTION INTERNAL FIXATION (ORIF) DISTAL RADIAL FRACTURE (Left )   Anesthesia type:  Choice   Pre-op diagnosis:  LEFT DISTAL RADIUS FRACTURE   Location:  [facility] ROOM 06 / [facility]   Surgeon:  Teryl LucyLandau, Joshua, MD      DISCUSSION: Patient is an 85 year old female scheduled for the above procedure.  History includes never smoker, CAD/stable angina (s/p PCI LAD 2009 in [place]; patent LAD and old finding of jailed D1 11/2013; non-ischemic stress test 06/2018), pulmonary hypertension (PA peak pressure 55 mmHg 06/2018 echo), OSA (CPAP, inconsistent use as of 12/2018), home O2 (nocturnal with CPAP), hiatal hernia, HTN, hypercholesterolemia, hypothyroidism, spinal fusion, fall risk.    [facility] in [facility] ED on 04/30/19 following a fall in a grocery store parking lot. No LOC. She sustained a left distal radius fracture and slightly displaced ulnar styloid fracture, as well as a mildly displaces left nasal bone fracture. Conservative treatment for nasal fracture recommended. Left arm splint with ortho follow-up for radial fracture.   She has seen pulmonology and cardiology within the past six months. Had stress and echo within the past year. Unless acute changes then I would anticipate that she can proceed as planned. She will need labs on arrival. 05/12/19 SAR CoV2 negative.  She reported instructions to hold ASA three days prior to surgery.   VS: Ht 4\' 11"  (1.499 m)   Wt 78 kg   BMI 34.74 kg/m   PROVIDERS: Jethro BastosKoehler, Robert N, MD is PCP - Virl Diamondlalere, Adewale, MD is pulmonologist. Last visit 12/23/18. Pulmonary hypertension felt to be multfactorial, related to nocturnal hypoxemia. Encouraged compliance with CPAP/O2. One year follow-up recommended with repeat CT scan. Jodelle Red- Christopher, Bridgette, MD is cardiologist (previously End, Cristal Deerhristopher, MD). Last visit 11/10/18. One year follow-up recommended.  Sharrell Ku- Medoff, Jeffrey, MD  is GI [facility] Care Everywhere)   LABS: She is for updated labs on the day of surgery. As of 06/2018, Cr 1.00, glucose 115, H/H 12.2/36.0.    IMAGES: DG left wrist 04/30/19: IMPRESSION: 1. Displaced/comminuted fracture of the distal LEFT radius, as detailed above. 2. Slightly displaced fracture at the base of the ulnar styloid.  CT head 04/30/19: IMPRESSION: 1. No acute intracranial pathology. Mild small-vessel white matter disease. 2. Mildly displaced fractures of the left nasal bones (series 4, image 5).  CT Maxillofacial 04/30/19: IMPRESSION: 1. Nondisplaced fracture of the left nasal bone.  CXR 12/13/18: IMPRESSION: Right basilar opacity is noted which corresponds to atelectasis or scarring described on prior CT scan. Stable hiatal hernia. No significant change compared to prior exam.  CT Chest 09/28/18: IMPRESSION: 1. Right middle lobe atelectasis/scarring. 2. Hyperinflation with suspected air trapping bilaterally. 3. Minimal subpleural fibrotic changes bilaterally. 4. Small right lung nodules measuring up to 4 mm in size. No follow-up needed if patient is low-risk (and has no known or suspected primary neoplasm). Non-contrast chest CT can be considered in 12 months if patient is high-risk. This recommendation follows the consensus statement: Guidelines for Management of Incidental Pulmonary Nodules Detected on CT Images: From the Fleischner Society 2017; Radiology 2017; 284:228-243. 5. Partially visualized ventral hernia containing bowel. 6. Aortic Atherosclerosis (ICD10-I70.0).   EKG: 11/10/18: NSR   CV: Cardiac Event Monitor 09/16/18-10/15/18:  The patient was enrolled for 30 days. 60% of the monitoring period yielded diagnostic tracings.  The predominant rhythm was sinus with an average rate of 72 bpm (  range 51 to 125 bpm).  Isolated PACs and PVCs were noted.  No sustained arrhythmia or prolonged pause was identified. Predominantly sinus rhythm with  isolated PACs and PVCs. No sustained arrhythmia.  Lexiscan 06/17/18:  The left ventricular ejection fraction is hyperdynamic (>65%).  Nuclear stress EF: 71%.  There was no ST segment deviation noted during stress.  There is a small defect of mild severity present in the basal anteroseptal, mid anteroseptal and apical septal location. The defect is non-reversible that is consistent with breast attenuation artifact. No ischemia noted.  This is a low risk study.  Echo 06/17/18 Study Conclusions: - Left ventricle: The cavity size was normal. Wall thickness was normal. Systolic function was normal. The estimated ejection fraction was in the range of 60% to 65%. Wall motion was normal; there were no regional wall motion abnormalities. Left ventricular diastolic function parameters were normal. - Mitral valve: Calcified annulus. There was mild regurgitation. - Pulmonary arteries: Systolic pressure was moderately increased. PA peak pressure: 55 mm Hg (S). Impressions: - Normal LV systolic function; mild MR; trace TR with moderate pulmonary hypertension.  Cardiac cath 11/16/13 ([facility] [place]): Impression: 1. Normal LV systolic function. EF 65%. 2. Prior stent site in LAD is patent, with jailing of the diagonal 1. This is an old finding. 3. Codominant circumflex normal appearing. 4. Codominant RCA normal appearing. 5. Mild pulmonary hypertension.   Past Medical History:  Diagnosis Date  . At risk for falls   . CAD (coronary artery disease)   . GERD (gastroesophageal reflux disease)   . Hypercholesterolemia   . Hypertension   . Hypothyroidism   . Major depression   . Musculoskeletal back pain    CHRONIC LEFT-SIDED DISCOMFORT  . Obstructive sleep apnea on CPAP   . Osteopenia   . PVD (peripheral vascular disease) (HCC)   . Stable angina (HCC)   . Vitamin D deficiency     Past Surgical History:  Procedure Laterality Date  . ANKLE SURGERY Left   .  CARDIAC CATHETERIZATION  06/2008   PCI to LAD with 3.0 x 12 mm DES  . SPINAL FUSION      MEDICATIONS: No current facility-administered medications for this encounter.    Marland Kitchen acetaminophen (TYLENOL) 650 MG CR tablet  . aspirin EC 81 MG tablet  . Cholecalciferol (VITAMIN D3) 5000 units TABS  . enalapril (VASOTEC) 20 MG tablet  . escitalopram (LEXAPRO) 5 MG tablet  . furosemide (LASIX) 20 MG tablet  . levothyroxine (SYNTHROID, LEVOTHROID) 50 MCG tablet  . mirtazapine (REMERON) 7.5 MG tablet  . Multiple Vitamins-Minerals (PRESERVISION AREDS 2) CAPS  . nitroGLYCERIN (NITROSTAT) 0.4 MG SL tablet  . pantoprazole (PROTONIX) 20 MG tablet  . pravastatin (PRAVACHOL) 80 MG tablet  . ranolazine (RANEXA) 500 MG 12 hr tablet  . traMADol (ULTRAM) 50 MG tablet    Shonna Chock, PA-C Surgical Short Stay/Anesthesiology [facility] Phone [phone] [facility] Phone [phone] 05/13/2019 3:47 PM

## 2019-05-13 NOTE — Progress Notes (Signed)
Patient informed of the Hospital Visitation Restriction Policy that is currently in effect.  Patient verbalized understanding.  Patient denies shortness of breath, fever, cough and chest pain.  PCP - Izetta Dakin, DNP at [facility] Cardiologist - Dr Cristal Deer End Pulmonology Dr Virl Diamond  Chest x-ray - 12/13/18 EKG -11/10/18  Stress Test - Denies ECHO - 06/17/18 Cardiac Cath - 08/02/18 Sleep Study - Yes CPAP - Wears CPAP nightly.  Does not need to bring mask/tubing - outpatient procedure.  ERAS:No drink given by surgeon.  SDW patient.  Clears til 0430 DOS.  Aspirin Instructions: Per patient, hold 3 days prior to surgery per Dr Dion Saucier.   Anesthesia review: YesRevonda Standard, PA   Coronavirus Screening Have you/Daughter experienced the following symptoms:  Cough yes/no: No Fever (>100.39F)  yes/no: No Runny nose yes/no: No Sore throat yes/no: No Difficulty breathing/shortness of breath  yes/no: No  Have you or a family member traveled in the last 14 days and where? yes/no: No  STOP now taking any Aspirin (unless otherwise instructed by your surgeon), Aleve, Naproxen, Ibuprofen, Motrin, Advil, Goody's, BC's, all herbal medications, fish oil, and all vitamins.

## 2019-05-16 ENCOUNTER — Encounter (HOSPITAL_COMMUNITY): Payer: Self-pay

## 2019-05-16 ENCOUNTER — Ambulatory Visit (HOSPITAL_COMMUNITY): Payer: Medicare (Managed Care) | Admitting: Vascular Surgery

## 2019-05-16 ENCOUNTER — Encounter (HOSPITAL_COMMUNITY)
Admission: RE | Disposition: A | Discharge status: Home / Self Care | Payer: Self-pay | Source: Home / Self Care | Attending: Orthopedic Surgery

## 2019-05-16 ENCOUNTER — Other Ambulatory Visit: Payer: Self-pay

## 2019-05-16 ENCOUNTER — Ambulatory Visit (HOSPITAL_COMMUNITY)
Admission: RE | Admit: 2019-05-16 | Discharge: 2019-05-16 | Disposition: A | Discharge status: Home / Self Care | Payer: Medicare (Managed Care) | Attending: Orthopedic Surgery | Admitting: Orthopedic Surgery

## 2019-05-16 DIAGNOSIS — S52572A Other intraarticular fracture of lower end of left radius, initial encounter for closed fracture: Secondary | ICD-10-CM | POA: Diagnosis not present

## 2019-05-16 DIAGNOSIS — I739 Peripheral vascular disease, unspecified: Secondary | ICD-10-CM | POA: Insufficient documentation

## 2019-05-16 DIAGNOSIS — E039 Hypothyroidism, unspecified: Secondary | ICD-10-CM | POA: Insufficient documentation

## 2019-05-16 DIAGNOSIS — F329 Major depressive disorder, single episode, unspecified: Secondary | ICD-10-CM | POA: Diagnosis not present

## 2019-05-16 DIAGNOSIS — G4733 Obstructive sleep apnea (adult) (pediatric): Secondary | ICD-10-CM | POA: Diagnosis not present

## 2019-05-16 DIAGNOSIS — Z79890 Hormone replacement therapy (postmenopausal): Secondary | ICD-10-CM | POA: Diagnosis not present

## 2019-05-16 DIAGNOSIS — I272 Pulmonary hypertension, unspecified: Secondary | ICD-10-CM | POA: Diagnosis not present

## 2019-05-16 DIAGNOSIS — E559 Vitamin D deficiency, unspecified: Secondary | ICD-10-CM | POA: Insufficient documentation

## 2019-05-16 DIAGNOSIS — Z79899 Other long term (current) drug therapy: Secondary | ICD-10-CM | POA: Insufficient documentation

## 2019-05-16 DIAGNOSIS — I10 Essential (primary) hypertension: Secondary | ICD-10-CM | POA: Diagnosis not present

## 2019-05-16 DIAGNOSIS — Z7982 Long term (current) use of aspirin: Secondary | ICD-10-CM | POA: Diagnosis not present

## 2019-05-16 DIAGNOSIS — Z955 Presence of coronary angioplasty implant and graft: Secondary | ICD-10-CM | POA: Diagnosis not present

## 2019-05-16 DIAGNOSIS — Z9181 History of falling: Secondary | ICD-10-CM | POA: Diagnosis not present

## 2019-05-16 DIAGNOSIS — E7800 Pure hypercholesterolemia, unspecified: Secondary | ICD-10-CM | POA: Diagnosis not present

## 2019-05-16 DIAGNOSIS — I25118 Atherosclerotic heart disease of native coronary artery with other forms of angina pectoris: Secondary | ICD-10-CM | POA: Diagnosis not present

## 2019-05-16 DIAGNOSIS — M549 Dorsalgia, unspecified: Secondary | ICD-10-CM | POA: Diagnosis not present

## 2019-05-16 DIAGNOSIS — G8929 Other chronic pain: Secondary | ICD-10-CM | POA: Diagnosis not present

## 2019-05-16 DIAGNOSIS — K219 Gastro-esophageal reflux disease without esophagitis: Secondary | ICD-10-CM | POA: Insufficient documentation

## 2019-05-16 DIAGNOSIS — W19XXXA Unspecified fall, initial encounter: Secondary | ICD-10-CM | POA: Diagnosis not present

## 2019-05-16 DIAGNOSIS — Y92481 Parking lot as the place of occurrence of the external cause: Secondary | ICD-10-CM | POA: Insufficient documentation

## 2019-05-16 HISTORY — DX: Unspecified cataract: H26.9

## 2019-05-16 HISTORY — PX: OPEN REDUCTION INTERNAL FIXATION (ORIF) DISTAL RADIAL FRACTURE: SHX5989

## 2019-05-16 HISTORY — DX: Presence of dental prosthetic device (complete) (partial): Z97.2

## 2019-05-16 HISTORY — DX: Presence of spectacles and contact lenses: Z97.3

## 2019-05-16 HISTORY — DX: Dependence on other enabling machines and devices: Z99.89

## 2019-05-16 LAB — CBC
HCT: 37 % (ref 36.0–46.0)
Hemoglobin: 11.6 g/dL — ABNORMAL LOW (ref 12.0–15.0)
MCH: 28.4 pg (ref 26.0–34.0)
MCHC: 31.4 g/dL (ref 30.0–36.0)
MCV: 90.7 fL (ref 80.0–100.0)
Platelets: 366 10*3/uL (ref 150–400)
RBC: 4.08 MIL/uL (ref 3.87–5.11)
RDW: 14.6 % (ref 11.5–15.5)
WBC: 7.5 10*3/uL (ref 4.0–10.5)
nRBC: 0 % (ref 0.0–0.2)

## 2019-05-16 LAB — BASIC METABOLIC PANEL
Anion gap: 13 (ref 5–15)
BUN: 18 mg/dL (ref 8–23)
CO2: 23 mmol/L (ref 22–32)
Calcium: 9.5 mg/dL (ref 8.9–10.3)
Chloride: 104 mmol/L (ref 98–111)
Creatinine, Ser: 0.92 mg/dL (ref 0.44–1.00)
GFR calc Af Amer: >60 mL/min (ref >60)
GFR calc non Af Amer: 58 mL/min — ABNORMAL LOW (ref >60)
Glucose, Bld: 87 mg/dL (ref 70–99)
Potassium: 5.5 mmol/L — ABNORMAL HIGH (ref 3.5–5.1)
Sodium: 140 mmol/L (ref 135–145)

## 2019-05-16 SURGERY — OPEN REDUCTION INTERNAL FIXATION (ORIF) DISTAL RADIUS FRACTURE
Anesthesia: Regional | Laterality: Left

## 2019-05-16 MED ORDER — DEXAMETHASONE SODIUM PHOSPHATE 10 MG/ML IJ SOLN
INTRAMUSCULAR | Status: DC | PRN
  Administered 2019-05-16: 4 mg via INTRAVENOUS

## 2019-05-16 MED ORDER — ONDANSETRON HCL 4 MG/2ML IJ SOLN: 4.0000 mg | Freq: Once | INTRAMUSCULAR | Status: DC | PRN

## 2019-05-16 MED ORDER — CLONIDINE HCL (ANALGESIA) 100 MCG/ML EP SOLN
EPIDURAL | Status: DC | PRN
  Administered 2019-05-16: 100 ug

## 2019-05-16 MED ORDER — PROPOFOL 500 MG/50ML IV EMUL
INTRAVENOUS | Status: DC | PRN
  Administered 2019-05-16: 100 ug/kg/min via INTRAVENOUS

## 2019-05-16 MED ORDER — LACTATED RINGERS IV SOLN
INTRAVENOUS | Status: DC
  Administered 2019-05-16: 13:00:00 via INTRAVENOUS

## 2019-05-16 MED ORDER — FENTANYL CITRATE (PF) 100 MCG/2ML IJ SOLN
25.0000 ug | Freq: Once | INTRAMUSCULAR | Status: AC
  Administered 2019-05-16: 13:00:00 25 ug via INTRAVENOUS

## 2019-05-16 MED ORDER — CHLORHEXIDINE GLUCONATE 4 % EX LIQD: 60.0000 mL | Freq: Once | CUTANEOUS | Status: DC

## 2019-05-16 MED ORDER — FENTANYL CITRATE (PF) 100 MCG/2ML IJ SOLN: 25.0000 ug | INTRAMUSCULAR | Status: DC | PRN

## 2019-05-16 MED ORDER — MIDAZOLAM HCL 2 MG/2ML IJ SOLN
INTRAMUSCULAR | Status: AC
  Filled 2019-05-16: qty 2

## 2019-05-16 MED ORDER — FENTANYL CITRATE (PF) 100 MCG/2ML IJ SOLN
INTRAMUSCULAR | Status: AC
  Administered 2019-05-16: 25 ug via INTRAVENOUS
  Filled 2019-05-16: qty 2

## 2019-05-16 MED ORDER — ONDANSETRON HCL 4 MG/2ML IJ SOLN
INTRAMUSCULAR | Status: DC | PRN
  Administered 2019-05-16: 4 mg via INTRAVENOUS

## 2019-05-16 MED ORDER — FENTANYL CITRATE (PF) 250 MCG/5ML IJ SOLN
INTRAMUSCULAR | Status: AC
  Filled 2019-05-16: qty 5

## 2019-05-16 MED ORDER — CEFAZOLIN SODIUM-DEXTROSE 2-4 GM/100ML-% IV SOLN
INTRAVENOUS | Status: AC
  Filled 2019-05-16: qty 100

## 2019-05-16 MED ORDER — EPHEDRINE SULFATE-NACL 50-0.9 MG/10ML-% IV SOSY
PREFILLED_SYRINGE | INTRAVENOUS | Status: DC | PRN
  Administered 2019-05-16 (×2): 10 mg via INTRAVENOUS

## 2019-05-16 MED ORDER — PROPOFOL 500 MG/50ML IV EMUL
INTRAVENOUS | Status: AC
  Filled 2019-05-16: qty 100

## 2019-05-16 MED ORDER — FENTANYL CITRATE (PF) 100 MCG/2ML IJ SOLN
INTRAMUSCULAR | Status: DC | PRN
  Administered 2019-05-16: 25 ug via INTRAVENOUS

## 2019-05-16 MED ORDER — SODIUM CHLORIDE 0.9 % IV SOLN
INTRAVENOUS | Status: DC | PRN
  Administered 2019-05-16: 30 ug/min via INTRAVENOUS

## 2019-05-16 MED ORDER — OXYCODONE-ACETAMINOPHEN 5-325 MG PO TABS: 1.0000 | ORAL_TABLET | ORAL | 0 refills | Status: DC | PRN

## 2019-05-16 MED ORDER — BUPIVACAINE HCL (PF) 0.25 % IJ SOLN
INTRAMUSCULAR | Status: AC
  Filled 2019-05-16: qty 30

## 2019-05-16 MED ORDER — ACETAMINOPHEN 10 MG/ML IV SOLN: 1000.0000 mg | Freq: Once | INTRAVENOUS | Status: DC | PRN

## 2019-05-16 MED ORDER — CEFAZOLIN SODIUM-DEXTROSE 2-4 GM/100ML-% IV SOLN
2.0000 g | INTRAVENOUS | Status: AC
  Administered 2019-05-16: 2 g via INTRAVENOUS

## 2019-05-16 MED ORDER — ROPIVACAINE HCL 2 MG/ML IJ SOLN
INTRAMUSCULAR | Status: DC | PRN
  Administered 2019-05-16: 150 mg via PERINEURAL

## 2019-05-16 SURGICAL SUPPLY — 57 items
APL SKNCLS STERI-STRIP NONHPOA (GAUZE/BANDAGES/DRESSINGS) ×1
BANDAGE ACE 3X5.8 VEL STRL LF (GAUZE/BANDAGES/DRESSINGS) ×3 IMPLANT
BANDAGE ACE 4X5 VEL STRL LF (GAUZE/BANDAGES/DRESSINGS) ×3 IMPLANT
BENZOIN TINCTURE PRP APPL 2/3 (GAUZE/BANDAGES/DRESSINGS) ×3 IMPLANT
BIT DRILL 2 FAST STEP (BIT) ×3 IMPLANT
BIT DRILL 2.5X4 QC (BIT) ×3 IMPLANT
BLADE CLIPPER SURG (BLADE) IMPLANT
BNDG CMPR 9X4 STRL LF SNTH (GAUZE/BANDAGES/DRESSINGS) ×1
BNDG ESMARK 4X9 LF (GAUZE/BANDAGES/DRESSINGS) ×3 IMPLANT
BNDG GAUZE ELAST 4 BULKY (GAUZE/BANDAGES/DRESSINGS) ×3 IMPLANT
CLOSURE STERI-STRIP 1/2X4 (GAUZE/BANDAGES/DRESSINGS) ×1
CLSR STERI-STRIP ANTIMIC 1/2X4 (GAUZE/BANDAGES/DRESSINGS) ×2 IMPLANT
CORDS BIPOLAR (ELECTRODE) ×3 IMPLANT
COVER SURGICAL LIGHT HANDLE (MISCELLANEOUS) ×6 IMPLANT
COVER WAND RF STERILE (DRAPES) ×3 IMPLANT
CUFF TOURNIQUET SINGLE 18IN (TOURNIQUET CUFF) ×3 IMPLANT
CUFF TOURNIQUET SINGLE 24IN (TOURNIQUET CUFF) IMPLANT
DECANTER SPIKE VIAL GLASS SM (MISCELLANEOUS) IMPLANT
DRAPE INCISE IOBAN 66X45 STRL (DRAPES) IMPLANT
DRAPE OEC MINIVIEW 54X84 (DRAPES) IMPLANT
DRAPE U-SHAPE 47X51 STRL (DRAPES) ×3 IMPLANT
ELECT REM PT RETURN 9FT ADLT (ELECTROSURGICAL)
ELECTRODE REM PT RTRN 9FT ADLT (ELECTROSURGICAL) IMPLANT
GAUZE SPONGE 4X4 12PLY STRL (GAUZE/BANDAGES/DRESSINGS) ×3 IMPLANT
GAUZE XEROFORM 1X8 LF (GAUZE/BANDAGES/DRESSINGS) ×3 IMPLANT
GLOVE BIOGEL PI ORTHO PRO 7.5 (GLOVE) ×2
GLOVE BIOGEL PI ORTHO PRO SZ8 (GLOVE) ×2
GLOVE ORTHO TXT STRL SZ7.5 (GLOVE) ×3 IMPLANT
GLOVE PI ORTHO PRO STRL 7.5 (GLOVE) ×1 IMPLANT
GLOVE PI ORTHO PRO STRL SZ8 (GLOVE) ×1 IMPLANT
GLOVE SURG ORTHO 8.0 STRL STRW (GLOVE) ×6 IMPLANT
GOWN STRL REUS W/ TWL LRG LVL3 (GOWN DISPOSABLE) IMPLANT
GOWN STRL REUS W/TWL LRG LVL3 (GOWN DISPOSABLE)
KIT BASIN OR (CUSTOM PROCEDURE TRAY) ×3 IMPLANT
KIT TURNOVER KIT B (KITS) ×3 IMPLANT
LOOP VESSEL MAXI BLUE (MISCELLANEOUS) IMPLANT
MANIFOLD NEPTUNE II (INSTRUMENTS) ×3 IMPLANT
NEEDLE 22X1 1/2 (OR ONLY) (NEEDLE) IMPLANT
NS IRRIG 1000ML POUR BTL (IV SOLUTION) ×6 IMPLANT
PACK ORTHO EXTREMITY (CUSTOM PROCEDURE TRAY) ×3 IMPLANT
PAD ARMBOARD 7.5X6 YLW CONV (MISCELLANEOUS) ×6 IMPLANT
PAD CAST 4YDX4 CTTN HI CHSV (CAST SUPPLIES) ×1 IMPLANT
PADDING CAST COTTON 4X4 STRL (CAST SUPPLIES) ×3
PEG SUBCHONDRAL SMOOTH 2.0X18 (Peg) ×3 IMPLANT
PEG SUBCHONDRAL SMOOTH 2.0X20 (Peg) ×12 IMPLANT
PLATE STAN 24.4X59.5 LT (Plate) ×3 IMPLANT
SCREW BN 12X3.5XNS CORT TI (Screw) ×1 IMPLANT
SCREW CORT 3.5X10 LNG (Screw) ×3 IMPLANT
SCREW CORT 3.5X12 (Screw) ×3 IMPLANT
SCREW CORT 3.5X14 LNG (Screw) ×3 IMPLANT
SPLINT FIBERGLASS 4X15 (CAST SUPPLIES) ×3 IMPLANT
SUT VIC AB 3-0 FS2 27 (SUTURE) IMPLANT
SYR CONTROL 10ML LL (SYRINGE) IMPLANT
TOWEL OR 17X24 6PK STRL BLUE (TOWEL DISPOSABLE) ×3 IMPLANT
TOWEL OR 17X26 10 PK STRL BLUE (TOWEL DISPOSABLE) ×3 IMPLANT
TUBE CONNECTING 12'X1/4 (SUCTIONS) ×1
TUBE CONNECTING 12X1/4 (SUCTIONS) ×2 IMPLANT

## 2019-05-16 NOTE — Op Note (Signed)
[name], [name] MEDICAL RECORD [id number] ACCOUNT [id number] DATE OF BIRTH:07-Mar-1934 FAC[facility] LOCATION: [facility] PHYSICIAN:Aunesti Pellegrino A. Burney Gauze, MD  OPERATIVE REPORT  DATE OF PROCEDURE:  05/16/2019  PREOPERATIVE DIAGNOSIS:  Displaced intraarticular fracture, distal radius, left side.  POSTOPERATIVE DIAGNOSIS:  Displaced intraarticular fracture, distal radius, left side.  PROCEDURE:  Open reduction internal fixation of displaced left intraarticular distal radius fracture with DVR plate and screws.  SURGEON:  Charlotte Crumb, MD  ASSISTANT:  None.  ANESTHESIA:  Regional.  COMPLICATIONS:  No complications.  DRAINS:  No drains.  DESCRIPTION OF PROCEDURE:  The patient was taken to the operating suite after induction of adequate regional anesthetic.  The left upper extremity was prepped and draped in the usual sterile fashion.  An Esmarch was used to exsanguinate the limb, and the  tourniquet was inflated to 250 mmHg.  At this point in time, an incision was made in the palmar aspect of the left distal radius, centered between the FCR tendon and the radial artery.  The skin was incised.  Dissection was carried down to the fascia  under the FCR, which was incised.  Blunt dissection was carried down to the level of the pronator quadratus.  The pronator quadratus was subperiosteally stripped off the volar distal radius revealing an intraarticular 4-part fracture with volar  displacement.  We released the brachioradialis off the radial styloid fragment and then achieved reduction by extending the wrist over a rolled towel.  We then placed a standard DVR plate on the volar aspect of the distal radius, and under fluoroscopic  imaging determined appropriate plate position and affixed it with a 12 mm screw.  This was followed by 2 more cortical screws proximally, followed by 4 smooth pegs distally.  Fluoroscopic imaging revealed reduction of the fracture in multiple views.  The  wound was thoroughly irrigated and loosely closed in layers of 2-0 undyed Vicryl and 3-0 Prolene subcuticular stitch on the skin.  Steri-Strips, 4 x 4 fluffs, and a volar splint were applied.  The patient tolerated this procedure well and went to the  recovery room in stable fashion.  LN/NUANCE  D:05/16/2019 T:05/16/2019 JOB:[id number]

## 2019-05-16 NOTE — Anesthesia Procedure Notes (Signed)
Anesthesia Regional Block: Supraclavicular block   Pre-Anesthetic Checklist: ,, timeout performed, Correct Patient, Correct Site, Correct Laterality, Correct Procedure, Correct Position, site marked, Risks and benefits discussed,  Surgical consent,  Pre-op evaluation,  At surgeon's request and post-op pain management  Laterality: Left  Prep: chloraprep       Needles:  Injection technique: Single-shot  Needle Type: Echogenic Needle     Needle Length: 5cm  Needle Gauge: 21     Additional Needles:   Procedures:,,,, ultrasound used (permanent image in chart),,,,  Narrative:  Start time: 05/16/2019 1:05 PM End time: 05/16/2019 1:14 PM Injection made incrementally with aspirations every 5 mL.  Performed by: Personally  Anesthesiologist: Barnet Glasgow, MD

## 2019-05-16 NOTE — Op Note (Signed)
Please see dictated report [phone]

## 2019-05-16 NOTE — H&P (Signed)
Jennifer SheffieldGladys [name] is an 85 y.o. female.   Chief Complaint: Left wrist pain, swelling, and deformity HPI: Patient is a very pleasant 85 year old female right-hand-dominant status post fall last week with displaced intra-articular distal radius fracture nondominant left side.  Past Medical History:  Diagnosis Date  . Ambulates with cane    straight  . At risk for falls   . CAD (coronary artery disease)   . Cataracts, bilateral   . GERD (gastroesophageal reflux disease)   . Hypercholesterolemia   . Hypertension   . Hypothyroidism   . Major depression   . Musculoskeletal back pain    CHRONIC LEFT-SIDED DISCOMFORT  . Obstructive sleep apnea on CPAP    uses CPAP nightly  . Osteopenia   . PVD (peripheral vascular disease) (HCC)   . Stable angina (HCC)   . SVD (spontaneous vaginal delivery)    x 2  . Vitamin D deficiency   . Wears glasses   . Wears partial dentures    upper    Past Surgical History:  Procedure Laterality Date  . ANKLE SURGERY Left   . BACK SURGERY    . CARDIAC CATHETERIZATION  06/2008   PCI to LAD with 3.0 x 12 mm DES  . COLONOSCOPY    . EYE SURGERY     remove cataracts - bilateral  . MULTIPLE TOOTH EXTRACTIONS     upper teeth - upper dentures  . SPINAL FUSION    . UPPER GI ENDOSCOPY      Family History  Problem Relation Age of Onset  . Heart disease Mother   . Asthma Father   . Heart disease Brother [id number]       CAD   Social History:  reports that she has never smoked. She has never used smokeless tobacco. She reports that she does not drink alcohol or use drugs.  Allergies: No Known Allergies  Medications Prior to Admission  Medication Sig Dispense Refill  . acetaminophen (TYLENOL) 650 MG CR tablet Take 650 mg by mouth 2 (two) times a day.    Marland Kitchen. aspirin EC 81 MG tablet Take 81 mg by mouth daily. CARDIAC EVENT PREVENTION    . Cholecalciferol (VITAMIN D3) 5000 units TABS Take 5,000 Units by mouth daily with breakfast.     . enalapril (VASOTEC) 20 MG  tablet Take 20 mg by mouth daily.    Marland Kitchen. escitalopram (LEXAPRO) 5 MG tablet Take 5 mg by mouth daily.    . furosemide (LASIX) 20 MG tablet Take 20 mg by mouth daily.    Marland Kitchen. levothyroxine (SYNTHROID, LEVOTHROID) 50 MCG tablet Take 50 mcg by mouth daily before breakfast.    . mirtazapine (REMERON) 7.5 MG tablet Take 7.5 mg by mouth at bedtime.    . Multiple Vitamins-Minerals (PRESERVISION AREDS 2) CAPS Take 1 tablet by mouth daily.    . nitroGLYCERIN (NITROSTAT) 0.4 MG SL tablet Place 0.4 mg under the tongue every 5 (five) minutes as needed for chest pain.    . pantoprazole (PROTONIX) 20 MG tablet Take 20 mg by mouth daily.    . pravastatin (PRAVACHOL) 80 MG tablet Take 80 mg by mouth every evening.    . ranolazine (RANEXA) 500 MG 12 hr tablet Take 500 mg by mouth 2 (two) times daily.    . traMADol (ULTRAM) 50 MG tablet Take 50 mg by mouth daily as needed for moderate pain.      Results for orders placed or performed during the hospital encounter of 05/16/19 (from the past  48 hour(s))  CBC     Status: Abnormal   Collection Time: 05/16/19 12:33 PM  Result Value Ref Range   WBC 7.5 4.0 - 10.5 K/uL   RBC 4.08 3.87 - 5.11 MIL/uL   Hemoglobin 11.6 (L) 12.0 - 15.0 g/dL   HCT 37.0 36.0 - 46.0 %   MCV 90.7 80.0 - 100.0 fL   MCH 28.4 26.0 - 34.0 pg   MCHC 31.4 30.0 - 36.0 g/dL   RDW 14.6 11.5 - 15.5 %   Platelets 366 150 - 400 K/uL   nRBC 0.0 0.0 - 0.2 %    Comment: Performed at [facility], [place] [address] [place], [place] [address]  Basic metabolic panel     Status: Abnormal   Collection Time: 05/16/19 12:33 PM  Result Value Ref Range   Sodium 140 135 - 145 mmol/L   Potassium 5.5 (H) 3.5 - 5.1 mmol/L    Comment: SPECIMEN HEMOLYZED. HEMOLYSIS MAY AFFECT INTEGRITY OF RESULTS.   Chloride 104 98 - 111 mmol/L   CO2 23 22 - 32 mmol/L   Glucose, Bld 87 70 - 99 mg/dL   BUN 18 8 - 23 mg/dL   Creatinine, Ser 0.92 0.44 - 1.00 mg/dL   Calcium 9.5 8.9 - 10.3 mg/dL   GFR calc non Af Amer 58 (L)  >60 mL/min   GFR calc Af Amer >60 >60 mL/min   Anion gap 13 5 - 15    Comment: Performed at [facility] [address] [place], [place] [address]   No results found.  Review of Systems  All other systems reviewed and are negative.   Blood pressure (!) 180/75, pulse 63, temperature 98.2 F (36.8 C), temperature source Oral, resp. rate 18, height 4\' 11"  (1.499 m), weight 78.5 kg, SpO2 100 %. Physical Exam  Constitutional: She is oriented to person, place, and time. She appears well-developed and well-nourished.  HENT:  Head: Normocephalic and atraumatic.  Neck: Normal range of motion.  Cardiovascular: Normal rate.  Respiratory: Effort normal.  Musculoskeletal:     Left wrist: She exhibits tenderness, bony tenderness, swelling and deformity.     Comments: Distal forearm and wrist pain, swelling, and deformity on the left side  Neurological: She is alert and oriented to person, place, and time.  Skin: Skin is warm.  Psychiatric: She has a normal mood and affect. Her behavior is normal. Judgment and thought content normal.     Assessment/Plan 85 year old female with displaced intra-articular fracture distal radius on her nondominant left side.  Have discussed with the patient the role of open reduction internal fixation of this displaced intra-articular fracture as an outpatient.  She understands the risks and benefits and wishes to proceed  Schuyler Amor, MD 05/16/2019, 1:48 PM

## 2019-05-16 NOTE — Transfer of Care (Signed)
Immediate Anesthesia Transfer of Care Note  Patient: Jennifer [name]  Procedure(s) Performed: OPEN REDUCTION INTERNAL FIXATION (ORIF) DISTAL RADIAL FRACTURE (Left )  Patient Location: PACU  Anesthesia Type:Regional  Level of Consciousness: awake, alert  and oriented  Airway & Oxygen Therapy: Patient Spontanous Breathing and Patient connected to nasal cannula oxygen  Post-op Assessment: Report given to RN and Post -op Vital signs reviewed and stable  Post vital signs: Reviewed and stable  Last Vitals:  Vitals Value Taken Time  BP 127/111 05/16/2019  3:13 PM  Temp    Pulse 81 05/16/2019  3:13 PM  Resp 16 05/16/2019  3:13 PM  SpO2 94 % 05/16/2019  3:13 PM  Vitals shown include unvalidated device data.  Last Pain:  Vitals:   05/16/19 1320  TempSrc:   PainSc: 0-No pain      Patients Stated Pain Goal: 2 (46/27/03 5009)  Complications: No apparent anesthesia complications

## 2019-05-17 ENCOUNTER — Encounter (HOSPITAL_COMMUNITY): Payer: Self-pay | Admitting: Orthopedic Surgery

## 2019-05-17 NOTE — Anesthesia Postprocedure Evaluation (Signed)
Anesthesia Post Note  Patient: Sage Ehmann  Procedure(s) Performed: OPEN REDUCTION INTERNAL FIXATION (ORIF) DISTAL RADIAL FRACTURE (Left )     Patient location during evaluation: PACU Anesthesia Type: Regional and MAC Level of consciousness: awake and alert Pain management: pain level controlled Vital Signs Assessment: post-procedure vital signs reviewed and stable Respiratory status: spontaneous breathing, nonlabored ventilation and respiratory function stable Cardiovascular status: stable and blood pressure returned to baseline Postop Assessment: no apparent nausea or vomiting Anesthetic complications: no    Last Vitals:  Vitals:   05/16/19 1600 05/16/19 1620  BP: 133/61 139/64  Pulse: 61 63  Resp: 17 17  Temp:  36.4 C  SpO2: 95% 96%    Last Pain:  Vitals:   05/16/19 1620  TempSrc:   PainSc: 0-No pain   Pain Goal: Patients Stated Pain Goal: 2 (05/16/19 1202)                 Lynda Rainwater

## 2019-06-14 ENCOUNTER — Ambulatory Visit
Admission: RE | Admit: 2019-06-14 | Discharge: 2019-06-14 | Disposition: A | Discharge status: Home / Self Care | Payer: Medicare (Managed Care) | Source: Ambulatory Visit | Attending: Gerontology | Admitting: Gerontology

## 2019-06-14 ENCOUNTER — Other Ambulatory Visit: Payer: Self-pay | Admitting: Gerontology

## 2019-06-14 ENCOUNTER — Other Ambulatory Visit: Payer: Self-pay

## 2019-06-14 DIAGNOSIS — W19XXXA Unspecified fall, initial encounter: Secondary | ICD-10-CM

## 2019-06-14 DIAGNOSIS — M25551 Pain in right hip: Secondary | ICD-10-CM

## 2019-06-15 ENCOUNTER — Ambulatory Visit
Admission: RE | Admit: 2019-06-15 | Discharge: 2019-06-15 | Disposition: A | Discharge status: Home / Self Care | Payer: Medicare (Managed Care) | Source: Ambulatory Visit | Attending: Gerontology | Admitting: Gerontology

## 2019-06-15 ENCOUNTER — Ambulatory Visit: Payer: Medicare (Managed Care) | Admitting: Pulmonary Disease

## 2019-06-16 ENCOUNTER — Telehealth: Payer: Self-pay | Admitting: Pulmonary Disease

## 2019-06-17 NOTE — Telephone Encounter (Signed)
No msg needed per Lavella Lemons  Will close encounter

## 2019-06-21 ENCOUNTER — Ambulatory Visit (INDEPENDENT_AMBULATORY_CARE_PROVIDER_SITE_OTHER): Payer: Medicare (Managed Care) | Admitting: Pulmonary Disease

## 2019-06-21 ENCOUNTER — Encounter: Payer: Self-pay | Admitting: Pulmonary Disease

## 2019-06-21 ENCOUNTER — Other Ambulatory Visit: Payer: Self-pay

## 2019-06-21 VITALS — BP 122/64 | HR 89 | Temp 98.2°F | Ht 59.0 in | Wt 192.0 lb

## 2019-06-21 DIAGNOSIS — G4733 Obstructive sleep apnea (adult) (pediatric): Secondary | ICD-10-CM

## 2019-06-21 DIAGNOSIS — R05 Cough: Secondary | ICD-10-CM | POA: Diagnosis not present

## 2019-06-21 DIAGNOSIS — R059 Cough, unspecified: Secondary | ICD-10-CM

## 2019-06-21 NOTE — Progress Notes (Signed)
Jennifer BeachGladys [name]    [id number]    07/18/1934  Primary Care Physician:Koehler, Marvis Repressobert N, MD  Referring Physician: Jethro BastosKoehler, Robert N, MD [address] [place],  [place] [id number]  Chief complaint:    Follow-up obstructive sleep apnea Follow-up of pulmonary hypertension   HPI: She still does have intermittent cough  Had a recent fall and broke a bone in her nose-has not been able to use CPAP Also sustained for fracture She does have some pain and discomfort  Otherwise, has been relatively stable No recent significant shortness of breath, no significant pain or discomfort apart from relating to recent fall which was accidental  [place], did speak with Dr. Avie ArenasKirtane regarding a CT Repeat CT significant for some atelectasis  She has a history of moderate pulmonary hypertension-echo did confirm moderate pulmonary hypertension-2019  Coughing is better overall Still short of breath with activity   Outpatient Encounter Medications as of 06/21/2019  Medication Sig  . acetaminophen (TYLENOL) 650 MG CR tablet Take 650 mg by mouth 2 (two) times a day.  Marland Kitchen. aspirin EC 81 MG tablet Take 81 mg by mouth daily. CARDIAC EVENT PREVENTION  . Cholecalciferol (VITAMIN D3) 5000 units TABS Take 5,000 Units by mouth daily with breakfast.   . enalapril (VASOTEC) 20 MG tablet Take 20 mg by mouth daily.  Marland Kitchen. escitalopram (LEXAPRO) 5 MG tablet Take 5 mg by mouth daily.  . furosemide (LASIX) 20 MG tablet Take 20 mg by mouth daily.  Marland Kitchen. levothyroxine (SYNTHROID, LEVOTHROID) 50 MCG tablet Take 50 mcg by mouth daily before breakfast.  . mirtazapine (REMERON) 7.5 MG tablet Take 7.5 mg by mouth at bedtime.  . Multiple Vitamins-Minerals (PRESERVISION AREDS 2) CAPS Take 1 tablet by mouth daily.  . nitroGLYCERIN (NITROSTAT) 0.4 MG SL tablet Place 0.4 mg under the tongue every 5 (five) minutes as needed for chest pain.  Marland Kitchen. oxyCODONE-acetaminophen (PERCOCET) 5-325 MG tablet Take 1  tablet by mouth every 4 (four) hours as needed for severe pain.  . pantoprazole (PROTONIX) 20 MG tablet Take 20 mg by mouth daily.  . pravastatin (PRAVACHOL) 80 MG tablet Take 80 mg by mouth every evening.  . ranolazine (RANEXA) 500 MG 12 hr tablet Take 500 mg by mouth 2 (two) times daily.  . traMADol (ULTRAM) 50 MG tablet Take 50 mg by mouth daily as needed for moderate pain.   No facility-administered encounter medications on file as of 06/21/2019.     Allergies as of 06/21/2019  . (No Known Allergies)    Past Medical History:  Diagnosis Date  . Ambulates with cane    straight  . At risk for falls   . CAD (coronary artery disease)   . Cataracts, bilateral   . GERD (gastroesophageal reflux disease)   . Hypercholesterolemia   . Hypertension   . Hypothyroidism   . Major depression   . Musculoskeletal back pain    CHRONIC LEFT-SIDED DISCOMFORT  . Obstructive sleep apnea on CPAP    uses CPAP nightly  . Osteopenia   . PVD (peripheral vascular disease) (HCC)   . Stable angina (HCC)   . SVD (spontaneous vaginal delivery)    x 2  . Vitamin D deficiency   . Wears glasses   . Wears partial dentures    upper    Past Surgical History:  Procedure Laterality Date  . ANKLE SURGERY Left   . BACK SURGERY    . CARDIAC CATHETERIZATION  06/2008   PCI to LAD with 3.0 x 12 mm DES  . COLONOSCOPY    . EYE SURGERY     remove cataracts - bilateral  . MULTIPLE TOOTH EXTRACTIONS     upper teeth - upper dentures  . OPEN REDUCTION INTERNAL FIXATION (ORIF) DISTAL RADIAL FRACTURE Left 05/16/2019   Procedure: OPEN REDUCTION INTERNAL FIXATION (ORIF) DISTAL RADIAL FRACTURE;  Surgeon: Dairl PonderWeingold, Matthew, MD;  Location: [facility] OR;  Service: Orthopedics;  Laterality: Left;  . SPINAL FUSION    . UPPER GI ENDOSCOPY      Family History  Problem Relation Age of Onset  . Heart disease Mother   . Asthma Father   . Heart disease Brother 2635       CAD    Social History   Socioeconomic History  .  Marital status: Widowed    Spouse name: Not on file  . Number of children: 2  . Years of education: Not on file  . Highest education level: Not on file  Occupational History  . Not on file  Social Needs  . Financial resource strain: Not on file  . Food insecurity    Worry: Not on file    Inability: Not on file  . Transportation needs    Medical: Not on file    Non-medical: Not on file  Tobacco Use  . Smoking status: Never Smoker  . Smokeless tobacco: Never Used  Substance and Sexual Activity  . Alcohol use: Never    Frequency: Never  . Drug use: Never  . Sexual activity: Not on file  Lifestyle  . Physical activity    Days per week: Not on file    Minutes per session: Not on file  . Stress: Not on file  Relationships  . Social Musicianconnections    Talks on phone: Not on file    Gets together: Not on file    Attends religious service: Not on file    Active member of club or organization: Not on file    Attends meetings of clubs or organizations: Not on file    Relationship status: Not on file  . Intimate partner violence    Fear of current or ex partner: Not on file    Emotionally abused: Not on file    Physically abused: Not on file    Forced sexual activity: Not on file  Other Topics Concern  . Not on file  Social History Narrative  . Not on file    Review of Systems  Constitutional: Negative.   HENT: Negative.  Negative for congestion.   Eyes: Negative.   Respiratory: Positive for cough and shortness of breath.   Cardiovascular: Negative for chest pain.       Chest wall discomfort  Gastrointestinal: Negative.   Endocrine: Negative.   Musculoskeletal: Positive for arthralgias.    Vitals:   06/21/19 0914  BP: 122/64  Pulse: 89  Temp: 98.2 F (36.8 C)  SpO2: 99%     Physical Exam  Constitutional: She appears well-developed and well-nourished.  Overweight  HENT:  Head: Normocephalic and atraumatic.  Eyes: Pupils are equal, round, and reactive to light.  Conjunctivae and EOM are normal. Right eye exhibits no discharge. Left eye exhibits no discharge.  Neck: Normal range of motion. Neck supple. No tracheal deviation present. No thyromegaly present.  Cardiovascular: Normal rate and regular rhythm.  Pulmonary/Chest: Effort normal and breath sounds normal. No respiratory distress. She has no wheezes. She has no rales.  Abdominal: Soft.  Bowel sounds are normal. She exhibits no distension. There is no abdominal tenderness. There is no rebound.  Musculoskeletal:        General: Edema present.  Neurological:  Uses a walker   Data Reviewed: CT scan of the chest reviewed showing midlung atelectasis Echocardiogram with moderate pulmonary hypertension-results reviewed  Assessment:   Obstructive sleep apnea-has not been able to tolerate CPAP since facial bone injury -Will continue using oxygen -Encouraged to try CPAP when she is no longer in pain  Nocturnal desaturations -Continue oxygen segmentation  Pulmonary hypertension Multifactorial pulmonary hypertension May be related to nocturnal hypoxemia -Encouraged to continue using oxygen at night  Abnormal CT scan of the chest revealing atelectasis -Had a recent chest x-ray in January 2020-atelectasis -We will follow  Plan/Recommendations:  Try CPAP whenever she has pain from a facial bone fracture is not Continue oxygen use  Symptomatic treatment for cough  Call with any significant symptoms  We will see her back in the office in 6 months   Sherrilyn Rist MD [facility] 06/21/2019, 9:26 AM  CC: Janifer Adie, MD

## 2019-06-21 NOTE — Patient Instructions (Signed)
Chronic intermittent cough-related to reflux from hiatal hernia Obstructive sleep apnea-unable to use CPAP secondary to nasal bone fracture  Continue with oxygen use at night  Try CPAP whenever you are no longer having pain or discomfort  We will see you back in the office in 6 months  We will follow-up on your echocardiogram in about a year

## 2019-08-15 NOTE — Progress Notes (Deleted)
Cardiology Office Note:    Date:  08/15/2019   ID:  Jennifer [name], DOB December 22, 1933, MRN [id number]  PCP:  Jennifer Adie, MD  Cardiologist:  Jennifer Dresser, MD *** Electrophysiologist:  None  Pulmonologist:  Jennifer Rist, MD   Referring MD: Jennifer Adie, MD   No chief complaint on file. ***  History of Present Illness:    Jennifer [name] is a 85 y.o. female with ***  Coronary artery disease w/ stable angina  S/p DES to LAD in 2009 ([place])  Dx jailed by prior LAD stent (med Rx)  Cath 2014: LAD stent patent, D1 jailed   Myoview 7/19: no ischemia, EF 71  Echocardiogram 7/19: EF 60-65, PASP 55  Pulmonary Hypertension   Hypertension   Hyperlipidemia   OSA   GERD  Jennifer [name] was previously seen by Dr. Saunders Revel.  She then established with Dr. Harrell Gave and was last seen in 11/2018.  ***  Prior CV studies:   The following studies were reviewed today:  Event Monitor 09/16/18  The patient was enrolled for 30 days. 60% of the monitoring period yielded diagnostic tracings.  The predominant rhythm was sinus with an average rate of 72 bpm (range 51 to 125 bpm).  Isolated PACs and PVCs were noted.  No sustained arrhythmia or prolonged pause was identified. Predominantly sinus rhythm with isolated PACs and PVCs.  No sustained arrhythmia.  Nuclear stress test 06/17/2018 Breast attenuation artifact, no ischemia, EF 71, low risk  Echocardiogram 06/17/2018 EF 60-65, normal wall motion, normal diastolic function, MAC, mild MR, PASP 55 (moderate pulmonary hypertension)  Echocardiogram 08/11/2017 ([place], [place]) Mild concentric LVH, normal wall motion, EF 60-65, normal RVSF, RVSP 26  Nuclear stress test 08/12/2017 [facility], [place]) No ischemia or scar, EF >80  Chest CTA 08/11/2017 ([place], [place]) No pulmonary embolism, multiple subcentimeter pulmonary nodules, centrilobular tree-in-bud nodules in RML unchanged from 07/22/2017, stigmata of prior granulomatous  infection, CAD, moderate sized hiatal hernia  Right and left heart catheterization 11/16/2013 ([place], [place]) LAD proximal stent patent, D1 jailed from stent-unchanged LCx no significant stenosis RCA no significant stenosis EF 65 Mean PA pressure 31, mean PCWP 15  mild pulmonary hypertension  Past Medical History:  Diagnosis Date  . Ambulates with cane    straight  . At risk for falls   . CAD (coronary artery disease)   . Cataracts, bilateral   . GERD (gastroesophageal reflux disease)   . Hypercholesterolemia   . Hypertension   . Hypothyroidism   . Major depression   . Musculoskeletal back pain    CHRONIC LEFT-SIDED DISCOMFORT  . Obstructive sleep apnea on CPAP    uses CPAP nightly  . Osteopenia   . PVD (peripheral vascular disease) ([place])   . Stable angina (HCC)   . SVD (spontaneous vaginal delivery)    x 2  . Vitamin D deficiency   . Wears glasses   . Wears partial dentures    upper   Surgical Hx: The patient  has a past surgical history that includes Ankle surgery (Left); Spinal fusion; Cardiac catheterization (06/2008); Back surgery; Multiple tooth extractions; Upper gi endoscopy; Colonoscopy; Eye surgery; and Open reduction internal fixation (orif) distal radial fracture (Left, 05/16/2019).   Current Medications: No outpatient medications have been marked as taking for the 08/16/19 encounter (Appointment) with Richardson Dopp T, PA-C.     Allergies:   Patient has no known allergies.   Social History   Tobacco Use  . Smoking status: Never Smoker  . Smokeless tobacco:  Never Used  Substance Use Topics  . Alcohol use: Never    Frequency: Never  . Drug use: Never     Family Hx: The patient's family history includes Asthma in her father; Heart disease in her mother; Heart disease (age of onset: 69) in her brother.  ROS:   Please see the history of present illness.    ROS All other systems reviewed and are negative.   EKGs/Labs/Other Test Reviewed:    EKG:   EKG is *** ordered today.  The ekg ordered today demonstrates ***  Recent Labs: 05/16/2019: BUN 18; Creatinine, Ser 0.92; Hemoglobin 11.6; Platelets 366; Potassium 5.5; Sodium 140   Recent Lipid Panel Lab Results  Component Value Date/Time   CHOL 124 06/04/2018 09:56 AM   TRIG 56 06/04/2018 09:56 AM   HDL 66 06/04/2018 09:56 AM   CHOLHDL 1.9 06/04/2018 09:56 AM   LDLCALC 47 06/04/2018 09:56 AM    Physical Exam:    VS:  LMP  (LMP Unknown)     Wt Readings from Last 3 Encounters:  06/21/19 192 lb (87.1 kg)  05/16/19 173 lb (78.5 kg)  12/23/18 179 lb 12.8 oz (81.6 kg)     ***Physical Exam  ASSESSMENT & PLAN:    No diagnosis found.*** Coronary artery disease involving native coronary artery of native heart with angina pectoris [facility])  History of prior drug-eluting stent to the LAD in 2009 while living in [place].  Cardiac catheterization in 2014 demonstrated patent stent and a jailed diagonal.  She has no significant disease elsewhere.  She has a history of chronic angina.  She recently saw Dr. Saunders Revel and underwent stress testing.  This was low risk and negative for ischemia.  She has noted significant improvement in her symptoms since increasing ranolazine.  Continue aspirin, atorvastatin, carvedilol, ranolazine.  Pulmonary hypertension, unspecified ([place]) PASP 55 on recent echocardiogram.  She was apparently evaluated by pulmonology while living in [place].  She was told that she would need routine follow-up.  It sounds like she has an inhaler.  She had mild pulmonary hypertension on right heart catheterization in 2014.  She denies a history of smoking.  I have recommended that we get her established with pulmonology here for continued management.             -Refer to pulmonology  Essential hypertension  The patient's blood pressure is controlled on her current regimen.  Continue current therapy.   Hyperlipidemia LDL goal <70 LDL optimal on most recent lab work.  Continue current  Rx.   Dispo:  No follow-ups on file.   Medication Adjustments/Labs and Tests Ordered: Current medicines are reviewed at length with the patient today.  Concerns regarding medicines are outlined above.  Tests Ordered: No orders of the defined types were placed in this encounter.  Medication Changes: No orders of the defined types were placed in this encounter.   Signed, Richardson Dopp, PA-C  08/15/2019 3:26 PM    [facility] [place], [place], [place]  [address] Phone: [phone]; Fax: [phone]

## 2019-08-16 ENCOUNTER — Ambulatory Visit: Payer: Medicare (Managed Care) | Admitting: Physician Assistant

## 2019-09-01 NOTE — Progress Notes (Signed)
Cardiology Office Note:    Date:  09/02/2019   ID:  Jennifer [name], DOB Jan 14, 1934, MRN [id number]  PCP:  Janifer Adie, MD  Cardiologist:  Buford Dresser, MD  Electrophysiologist:  None  Pulmonologist:  Sherrilyn Rist, MD   Referring MD: Janifer Adie, MD   Chief Complaint  Patient presents with   Follow-up    CAD    History of Present Illness:    Jennifer [name] is a 85 y.o. female with:  Coronary artery disease w/ stable angina  S/p DES to LAD in 2009 ([place])  Dx jailed by prior LAD stent (med Rx)  Cath 2014: LAD stent patent, D1 jailed   Myoview 7/19: no ischemia, EF 71  Echocardiogram 7/19: EF 60-65, PASP 55  Pulmonary Hypertension   Hypertension   Hyperlipidemia   OSA   GERD   Ms. Winkel was previously seen by Dr. Saunders Revel.  She then established with Dr. Harrell Gave and was last seen in 11/2018.    She returns for follow-up.  She has chronic shortness of breath.  Over the last several weeks, she notes worsening shortness of breath with exertion.  She states on incline chronically.  She has not had PND.  She notes lower extremity swelling.  She has not had chest discomfort.  She notes worsening shortness of breath with bending over..  Of note, her weight is increased 19 pounds since June.   Prior CV studies:   The following studies were reviewed today:  Event Monitor 09/16/18  The patient was enrolled for 30 days. 60% of the monitoring period yielded diagnostic tracings.  The predominant rhythm was sinus with an average rate of 72 bpm (range 51 to 125 bpm).  Isolated PACs and PVCs were noted.  No sustained arrhythmia or prolonged pause was identified. Predominantly sinus rhythm with isolated PACs and PVCs.  No sustained arrhythmia.  Nuclear stress test 06/17/2018 Breast attenuation artifact, no ischemia, EF 71, low risk  Echocardiogram 06/17/2018 EF 60-65, normal wall motion, normal diastolic function, MAC, mild MR, PASP 55  (moderate pulmonary hypertension)  Echocardiogram 08/11/2017 ([place], [place]) Mild concentric LVH, normal wall motion, EF 60-65, normal RVSF, RVSP 26  Nuclear stress test 08/12/2017 [facility], [place]) No ischemia or scar, EF >80  Chest CTA 08/11/2017 ([place], [place]) No pulmonary embolism, multiple subcentimeter pulmonary nodules, centrilobular tree-in-bud nodules in RML unchanged from 07/22/2017, stigmata of prior granulomatous infection, CAD, moderate sized hiatal hernia  Right and left heart catheterization 11/16/2013 ([place], [place]) LAD proximal stent patent, D1 jailed from stent-unchanged LCx no significant stenosis RCA no significant stenosis EF 65 Mean PA pressure 31, mean PCWP 15  mild pulmonary hypertension  Past Medical History:  Diagnosis Date   Ambulates with cane    straight   At risk for falls    CAD (coronary artery disease)    Cataracts, bilateral    GERD (gastroesophageal reflux disease)    Hypercholesterolemia    Hypertension    Hypothyroidism    Major depression    Musculoskeletal back pain    CHRONIC LEFT-SIDED DISCOMFORT   Obstructive sleep apnea on CPAP    uses CPAP nightly   Osteopenia    PVD (peripheral vascular disease) (HCC)    Stable angina ([place])    SVD (spontaneous vaginal delivery)    x 2   Vitamin D deficiency    Wears glasses    Wears partial dentures    upper   Surgical Hx: The patient  has a past surgical history that includes  Ankle surgery (Left); Spinal fusion; Cardiac catheterization (06/2008); Back surgery; Multiple tooth extractions; Upper gi endoscopy; Colonoscopy; Eye surgery; and Open reduction internal fixation (orif) distal radial fracture (Left, 05/16/2019).   Current Medications: Current Meds  Medication Sig   acetaminophen (TYLENOL) 650 MG CR tablet Take 650 mg by mouth 2 (two) times a day.   aspirin EC 81 MG tablet Take 81 mg by mouth daily. CARDIAC EVENT PREVENTION   Cholecalciferol (VITAMIN D3) 5000 units  TABS Take 5,000 Units by mouth daily with breakfast.    enalapril (VASOTEC) 20 MG tablet Take 20 mg by mouth daily.   escitalopram (LEXAPRO) 5 MG tablet Take 5 mg by mouth daily.   furosemide (LASIX) 20 MG tablet Take 20 mg by mouth daily.   levothyroxine (SYNTHROID, LEVOTHROID) 50 MCG tablet Take 50 mcg by mouth daily before breakfast.   mirtazapine (REMERON) 7.5 MG tablet Take 7.5 mg by mouth at bedtime.   Multiple Vitamins-Minerals (PRESERVISION AREDS 2) CAPS Take 1 tablet by mouth daily.   nitroGLYCERIN (NITROSTAT) 0.4 MG SL tablet Place 0.4 mg under the tongue every 5 (five) minutes as needed for chest pain.   oxyCODONE-acetaminophen (PERCOCET) 5-325 MG tablet Take 1 tablet by mouth every 4 (four) hours as needed for severe pain.   pantoprazole (PROTONIX) 20 MG tablet Take 20 mg by mouth daily.   pravastatin (PRAVACHOL) 80 MG tablet Take 80 mg by mouth every evening.   ranolazine (RANEXA) 500 MG 12 hr tablet Take 500 mg by mouth 2 (two) times daily.   traMADol (ULTRAM) 50 MG tablet Take 50 mg by mouth daily as needed for moderate pain.     Allergies:   Patient has no known allergies.   Social History   Tobacco Use   Smoking status: Never Smoker   Smokeless tobacco: Never Used  Substance Use Topics   Alcohol use: Never    Frequency: Never   Drug use: Never     Family Hx: The patient's family history includes Asthma in her father; Heart disease in her mother; Heart disease (age of onset: 44) in her brother.  ROS:   Please see the history of present illness.    ROS All other systems reviewed and are negative.   EKGs/Labs/Other Test Reviewed:    EKG:  EKG is  ordered today.  The ekg ordered today demonstrates normal sinus rhythm, heart rate 78, normal axis, PACs, no ST-T wave changes, QTC 426, similar to prior tracing  Recent Labs: 05/16/2019: BUN 18; Creatinine, Ser 0.92; Hemoglobin 11.6; Platelets 366; Potassium 5.5; Sodium 140   Recent Lipid Panel Lab  Results  Component Value Date/Time   CHOL 124 06/04/2018 09:56 AM   TRIG 56 06/04/2018 09:56 AM   HDL 66 06/04/2018 09:56 AM   CHOLHDL 1.9 06/04/2018 09:56 AM   LDLCALC 47 06/04/2018 09:56 AM    Physical Exam:    VS:  BP (!) 146/80    Pulse 78    Ht _0  (1.499 m)    Wt 192 lb 12.8 oz (87.5 kg)    LMP  (LMP Unknown)    SpO2 97%    BMI 38.94 kg/m     Wt Readings from Last 3 Encounters:  09/02/19 192 lb 12.8 oz (87.5 kg)  06/21/19 192 lb (87.1 kg)  05/16/19 173 lb (78.5 kg)     Physical Exam  Constitutional: She is oriented to person, place, and time. She appears well-developed and well-nourished. No distress.  HENT:  Head: Normocephalic and  atraumatic.  Eyes: No scleral icterus.  Neck: JVD (JVP 6 cm) present. No thyromegaly present.  Cardiovascular: Normal rate, regular rhythm and normal heart sounds.  No murmur heard. Pulmonary/Chest: Effort normal. She has no wheezes. She has rales (vs coarse bs bilat) in the right lower field and the left lower field.  Abdominal: Soft. There is no hepatomegaly.  Musculoskeletal:        General: No edema.  Lymphadenopathy:    She has no cervical adenopathy.  Neurological: She is alert and oriented to person, place, and time.  Skin: Skin is warm and dry.  Psychiatric: She has a normal mood and affect.    ASSESSMENT & PLAN:    1. Shortness of breath Shortness of breath is likely multifactorial.  However, she seems to have evidence of volume excess on exam today.  I suspect that she has diastolic CHF.  I will increase her furosemide to 40 mg daily x3 days.  Obtain BMET, BNP today.  If BNP is significantly elevated, I will continue her on higher dose furosemide for longer.  She also has pulmonary hypertension.  We discussed adding amlodipine to her medical regimen for blood pressure as well as a vasodilator to help with her shortness of breath.  This will be considered at follow-up.  2. Coronary artery disease of native artery of native  heart with stable angina pectoris [facility]) History of DES to the LAD in 2009 while in [place].  Myoview in 2019 was low risk.  She is not having angina.  Continue aspirin, ranolazine, statin.  3. Essential hypertension Blood pressure above target.  Increase diuresis initially as outlined above.  If her blood pressure remains above target at follow-up, consider adding amlodipine.  4. Mixed hyperlipidemia Continue statin therapy.  Obtain fasting lipids and LFTs today.  5. Pulmonary hypertension, unspecified ([place]) Notes reviewed from pulmonology.  It is felt that she has multifactorial pulmonary hypertension.  It is most likely related to nocturnal hypoxemia.  As noted, her blood pressure remains above target, we could consider adding amlodipine at follow-up.  I discussed this with her today.  6. OSA (obstructive sleep apnea) Continue follow-up with pulmonology.  She has been prescribed nocturnal oxygen.  Dispo:  Return in about 3 weeks (around 09/23/2019) for Close Follow Up with Dr. Harrell Gave, or Richardson Dopp, PA-C, (virtual or in-person).   Medication Adjustments/Labs and Tests Ordered: Current medicines are reviewed at length with the patient today.  Concerns regarding medicines are outlined above.  Tests Ordered: Orders Placed This Encounter  Procedures   Basic metabolic panel   Hepatic function panel   Pro b natriuretic peptide (BNP)   Lipid panel   EKG 12-Lead   Medication Changes: No orders of the defined types were placed in this encounter.   Signed, Richardson Dopp, PA-C  09/02/2019 8:46 AM    [facility] [address], [place], [place]  [address] Phone: [phone]; Fax: [phone]

## 2019-09-02 ENCOUNTER — Encounter: Payer: Self-pay | Admitting: Physician Assistant

## 2019-09-02 ENCOUNTER — Other Ambulatory Visit: Payer: Self-pay

## 2019-09-02 ENCOUNTER — Encounter (INDEPENDENT_AMBULATORY_CARE_PROVIDER_SITE_OTHER): Payer: Self-pay

## 2019-09-02 ENCOUNTER — Ambulatory Visit (INDEPENDENT_AMBULATORY_CARE_PROVIDER_SITE_OTHER): Payer: Medicare (Managed Care) | Admitting: Physician Assistant

## 2019-09-02 VITALS — BP 146/80 | HR 78 | Ht 59.0 in | Wt 192.8 lb

## 2019-09-02 DIAGNOSIS — E782 Mixed hyperlipidemia: Secondary | ICD-10-CM | POA: Diagnosis not present

## 2019-09-02 DIAGNOSIS — R0602 Shortness of breath: Secondary | ICD-10-CM

## 2019-09-02 DIAGNOSIS — I10 Essential (primary) hypertension: Secondary | ICD-10-CM | POA: Diagnosis not present

## 2019-09-02 DIAGNOSIS — I25118 Atherosclerotic heart disease of native coronary artery with other forms of angina pectoris: Secondary | ICD-10-CM | POA: Diagnosis not present

## 2019-09-02 DIAGNOSIS — I272 Pulmonary hypertension, unspecified: Secondary | ICD-10-CM

## 2019-09-02 DIAGNOSIS — G4733 Obstructive sleep apnea (adult) (pediatric): Secondary | ICD-10-CM

## 2019-09-02 NOTE — Patient Instructions (Signed)
Medication Instructions:  Your physician has recommended you make the following change in your medication:   1. INCREASE FUROSEMIDE TO 40 MG DAILY FOR 3 DAYS, THEN DECREASE BACK TO 20 MG DAILY.  If you need a refill on your cardiac medications before your next appointment, please call your pharmacy.   Lab work: TODAY: BMET, LFTS, BNP, LIPIDS  If you have labs (blood work) drawn today and your tests are completely normal, you will receive your results only by: Marland Kitchen MyChart Message (if you have MyChart) OR . A paper copy in the mail If you have any lab test that is abnormal or we need to change your treatment, we will call you to review the results.  Testing/Procedures: NONE  Follow-Up: At [facility], you and your health needs are our priority.  As part of our continuing mission to provide you with exceptional heart care, we have created designated Provider Care Teams.  These Care Teams include your primary Cardiologist (physician) and Advanced Practice Providers (APPs -  Physician Assistants and Nurse Practitioners) who all work together to provide you with the care you need, when you need it. You will need a follow up appointment in 2-3 weeks.  Please call our office 2 months in advance to schedule this appointment.  You may see Buford Dresser, MD or Richardson Dopp PA-C

## 2019-09-03 LAB — BASIC METABOLIC PANEL
BUN/Creatinine Ratio: 16 (ref 12–28)
BUN: 17 mg/dL (ref 8–27)
CO2: 28 mmol/L (ref 20–29)
Calcium: 9.4 mg/dL (ref 8.7–10.3)
Chloride: 102 mmol/L (ref 96–106)
Creatinine, Ser: 1.07 mg/dL — ABNORMAL HIGH (ref 0.57–1.00)
GFR calc Af Amer: 55 mL/min/{1.73_m2} — ABNORMAL LOW (ref >59)
GFR calc non Af Amer: 48 mL/min/{1.73_m2} — ABNORMAL LOW (ref >59)
Glucose: 92 mg/dL (ref 65–99)
Potassium: 4.8 mmol/L (ref 3.5–5.2)
Sodium: 142 mmol/L (ref 134–144)

## 2019-09-03 LAB — HEPATIC FUNCTION PANEL
ALT: 8 IU/L (ref 0–32)
AST: 16 IU/L (ref 0–40)
Albumin: 3.9 g/dL (ref 3.6–4.6)
Alkaline Phosphatase: 95 IU/L (ref 39–117)
Bilirubin Total: 0.4 mg/dL (ref 0.0–1.2)
Bilirubin, Direct: 0.2 mg/dL (ref 0.00–0.40)
Total Protein: 6.7 g/dL (ref 6.0–8.5)

## 2019-09-03 LAB — LIPID PANEL
Chol/HDL Ratio: 1.6 ratio (ref 0.0–4.4)
Cholesterol, Total: 127 mg/dL (ref 100–199)
HDL: 77 mg/dL (ref >39)
LDL Chol Calc (NIH): 37 mg/dL (ref 0–99)
Triglycerides: 57 mg/dL (ref 0–149)
VLDL Cholesterol Cal: 13 mg/dL (ref 5–40)

## 2019-09-03 LAB — PRO B NATRIURETIC PEPTIDE: NT-Pro BNP: 133 pg/mL (ref 0–738)

## 2019-09-07 ENCOUNTER — Other Ambulatory Visit: Payer: Self-pay | Admitting: *Deleted

## 2019-09-07 MED ORDER — AMLODIPINE BESYLATE 2.5 MG PO TABS: 2.5000 mg | ORAL_TABLET | Freq: Every day | ORAL | 3 refills | Status: DC

## 2019-09-08 ENCOUNTER — Other Ambulatory Visit: Payer: Self-pay | Admitting: *Deleted

## 2019-09-20 NOTE — Progress Notes (Deleted)
Cardiology Office Note:    Date:  09/20/2019   ID:  Jennifer [name], DOB [birth year], MRN [id number]  PCP:  Jennifer Adie, MD  Cardiologist:  Buford Dresser, MD *** Electrophysiologist:  None   Referring MD: Jennifer Adie, MD   No chief complaint on file. ***  History of Present Illness:    Jennifer [name] is a 85 y.o. female with:   Coronary artery disease w/ stable angina ? S/p DES to LAD in 2009 [place]) ? Dx jailed by prior LAD stent (med Rx) ? Cath 2014: LAD stent patent, D1 jailed  ? Myoview 7/19: no ischemia, EF 71 ? Echocardiogram 7/19: EF 60-65, PASP 55  Pulmonary Hypertension   Hypertension   Hyperlipidemia   OSA   GERD   Ms. Jennifer [name] was last seen 09/02/2019.  She noted issues with shortness of breath at that time.  I adjusted her furosemide.  Her BNP was not significantly elevated.  Given evidence of pulmonary pretension in the past, I recommended adding amlodipine to her medical regimen as her blood pressure was above target.  She returns for follow-up.The DICTATELATER SmartLink is not supported in this context. ***  Prior CV studies:   The following studies were reviewed today:  *** Event Monitor 09/16/18  The patient was enrolled for 30 days. 60% of the monitoring period yielded diagnostic tracings.  The predominant rhythm was sinus with an average rate of 72 bpm (range 51 to 125 bpm).  Isolated PACs and PVCs were noted.  No sustained arrhythmia or prolonged pause was identified. Predominantly sinus rhythm with isolated PACs and PVCs. No sustained arrhythmia.  Nuclear stress test 06/17/2018 Breast attenuation artifact, no ischemia, EF 71, low risk  Echocardiogram 06/17/2018 EF 60-65, normal wall motion, normal diastolic function, MAC, mild MR, PASP 55 (moderate pulmonary hypertension)  Echocardiogram 08/11/2017 ([place], [place]) Mild concentric LVH, normal wall motion, EF 60-65, normal RVSF, RVSP 26  Nuclear stress test  08/12/2017 [facility], [place]) No ischemia or scar, EF >80  Chest CTA 08/11/2017 ([place], [place]) No pulmonary embolism, multiple subcentimeter pulmonary nodules, centrilobular tree-in-bud nodules in RML unchanged from 07/22/2017, stigmata of prior granulomatous infection, CAD, moderate sized hiatal hernia  Right and left heart catheterization 11/16/2013 ([place], [place]) LAD proximal stent patent, D1 jailed from stent-unchanged LCx no significant stenosis RCA no significant stenosis EF 65 Mean PA pressure 31, mean PCWP 15  mild pulmonary hypertension  Past Medical History:  Diagnosis Date  . Ambulates with cane    straight  . At risk for falls   . CAD (coronary artery disease)   . Cataracts, bilateral   . GERD (gastroesophageal reflux disease)   . Hypercholesterolemia   . Hypertension   . Hypothyroidism   . Major depression   . Musculoskeletal back pain    CHRONIC LEFT-SIDED DISCOMFORT  . Obstructive sleep apnea on CPAP    uses CPAP nightly  . Osteopenia   . PVD (peripheral vascular disease) ([place])   . Stable angina (HCC)   . SVD (spontaneous vaginal delivery)    x 2  . Vitamin D deficiency   . Wears glasses   . Wears partial dentures    upper   Surgical Hx: The patient  has a past surgical history that includes Ankle surgery (Left); Spinal fusion; Cardiac catheterization (06/2008); Back surgery; Multiple tooth extractions; Upper gi endoscopy; Colonoscopy; Eye surgery; and Open reduction internal fixation (orif) distal radial fracture (Left, 05/16/2019).   Current Medications: No outpatient medications have been marked as taking for  the 09/21/19 encounter (Appointment) with Liliane Shi, PA-C.     Allergies:   Patient has no known allergies.   Social History   Tobacco Use  . Smoking status: Never Smoker  . Smokeless tobacco: Never Used  Substance Use Topics  . Alcohol use: Never    Frequency: Never  . Drug use: Never     Family Hx: The patient's family history  includes Asthma in her father; Heart disease in her mother; Heart disease (age of onset: 28) in her brother.  ROS:   Please see the history of present illness.    ROS All other systems reviewed and are negative.   EKGs/Labs/Other Test Reviewed:    EKG:  EKG is *** ordered today.  The ekg ordered today demonstrates ***  Recent Labs: 05/16/2019: Hemoglobin 11.6; Platelets 366 09/02/2019: ALT 8; BUN 17; Creatinine, Ser 1.07; NT-Pro BNP 133; Potassium 4.8; Sodium 142   Recent Lipid Panel Lab Results  Component Value Date/Time   CHOL 127 09/02/2019 09:03 AM   TRIG 57 09/02/2019 09:03 AM   HDL 77 09/02/2019 09:03 AM   CHOLHDL 1.6 09/02/2019 09:03 AM   LDLCALC 37 09/02/2019 09:03 AM    Physical Exam:    VS:  LMP  (LMP Unknown)     Wt Readings from Last 3 Encounters:  09/02/19 192 lb 12.8 oz (87.5 kg)  06/21/19 192 lb (87.1 kg)  05/16/19 173 lb (78.5 kg)     ***Physical Exam  ASSESSMENT & PLAN:    *** 1. Shortness of breath Shortness of breath is likely multifactorial.  However, she seems to have evidence of volume excess on exam today.  I suspect that she has diastolic CHF.  I will increase her furosemide to 40 mg daily x3 days.  Obtain BMET, BNP today.  If BNP is significantly elevated, I will continue her on higher dose furosemide for longer.  She also has pulmonary hypertension.  We discussed adding amlodipine to her medical regimen for blood pressure as well as a vasodilator to help with her shortness of breath.  This will be considered at follow-up.  2. Coronary artery disease of native artery of native heart with stable angina pectoris [facility]) History of DES to the LAD in 2009 while in [place].  Myoview in 2019 was low risk.  She is not having angina.  Continue aspirin, ranolazine, statin.  3. Essential hypertension Blood pressure above target.  Increase diuresis initially as outlined above.  If her blood pressure remains above target at follow-up, consider adding  amlodipine.  4. Mixed hyperlipidemia Continue statin therapy.  Obtain fasting lipids and LFTs today.  5. Pulmonary hypertension, unspecified ([place]) Notes reviewed from pulmonology.  It is felt that she has multifactorial pulmonary hypertension.  It is most likely related to nocturnal hypoxemia.  As noted, her blood pressure remains above target, we could consider adding amlodipine at follow-up.  I discussed this with her today.  6. OSA (obstructive sleep apnea) Continue follow-up with pulmonology.  She has been prescribed nocturnal oxygen.  Dispo:  No follow-ups on file.   Medication Adjustments/Labs and Tests Ordered: Current medicines are reviewed at length with the patient today.  Concerns regarding medicines are outlined above.  Tests Ordered: No orders of the defined types were placed in this encounter.  Medication Changes: No orders of the defined types were placed in this encounter.   Signed, Richardson Dopp, PA-C  09/20/2019 4:07 PM    [facility] [place],  [place], [place]  [address] Phone: [phone]; Fax: [phone]

## 2019-09-21 ENCOUNTER — Ambulatory Visit: Payer: Medicare (Managed Care) | Admitting: Physician Assistant

## 2019-09-23 ENCOUNTER — Ambulatory Visit: Payer: Medicare (Managed Care) | Admitting: Physician Assistant

## 2019-10-04 ENCOUNTER — Ambulatory Visit: Payer: Medicare (Managed Care) | Admitting: Physician Assistant

## 2019-10-05 IMAGING — CR DG CHEST 2V
2 series · 2 of 2 positions shown · non-contrast
Comparison: No prior.

CLINICAL DATA: Right-sided rib pain.

EXAM:
CHEST - 2 VIEW

[w chest pa]
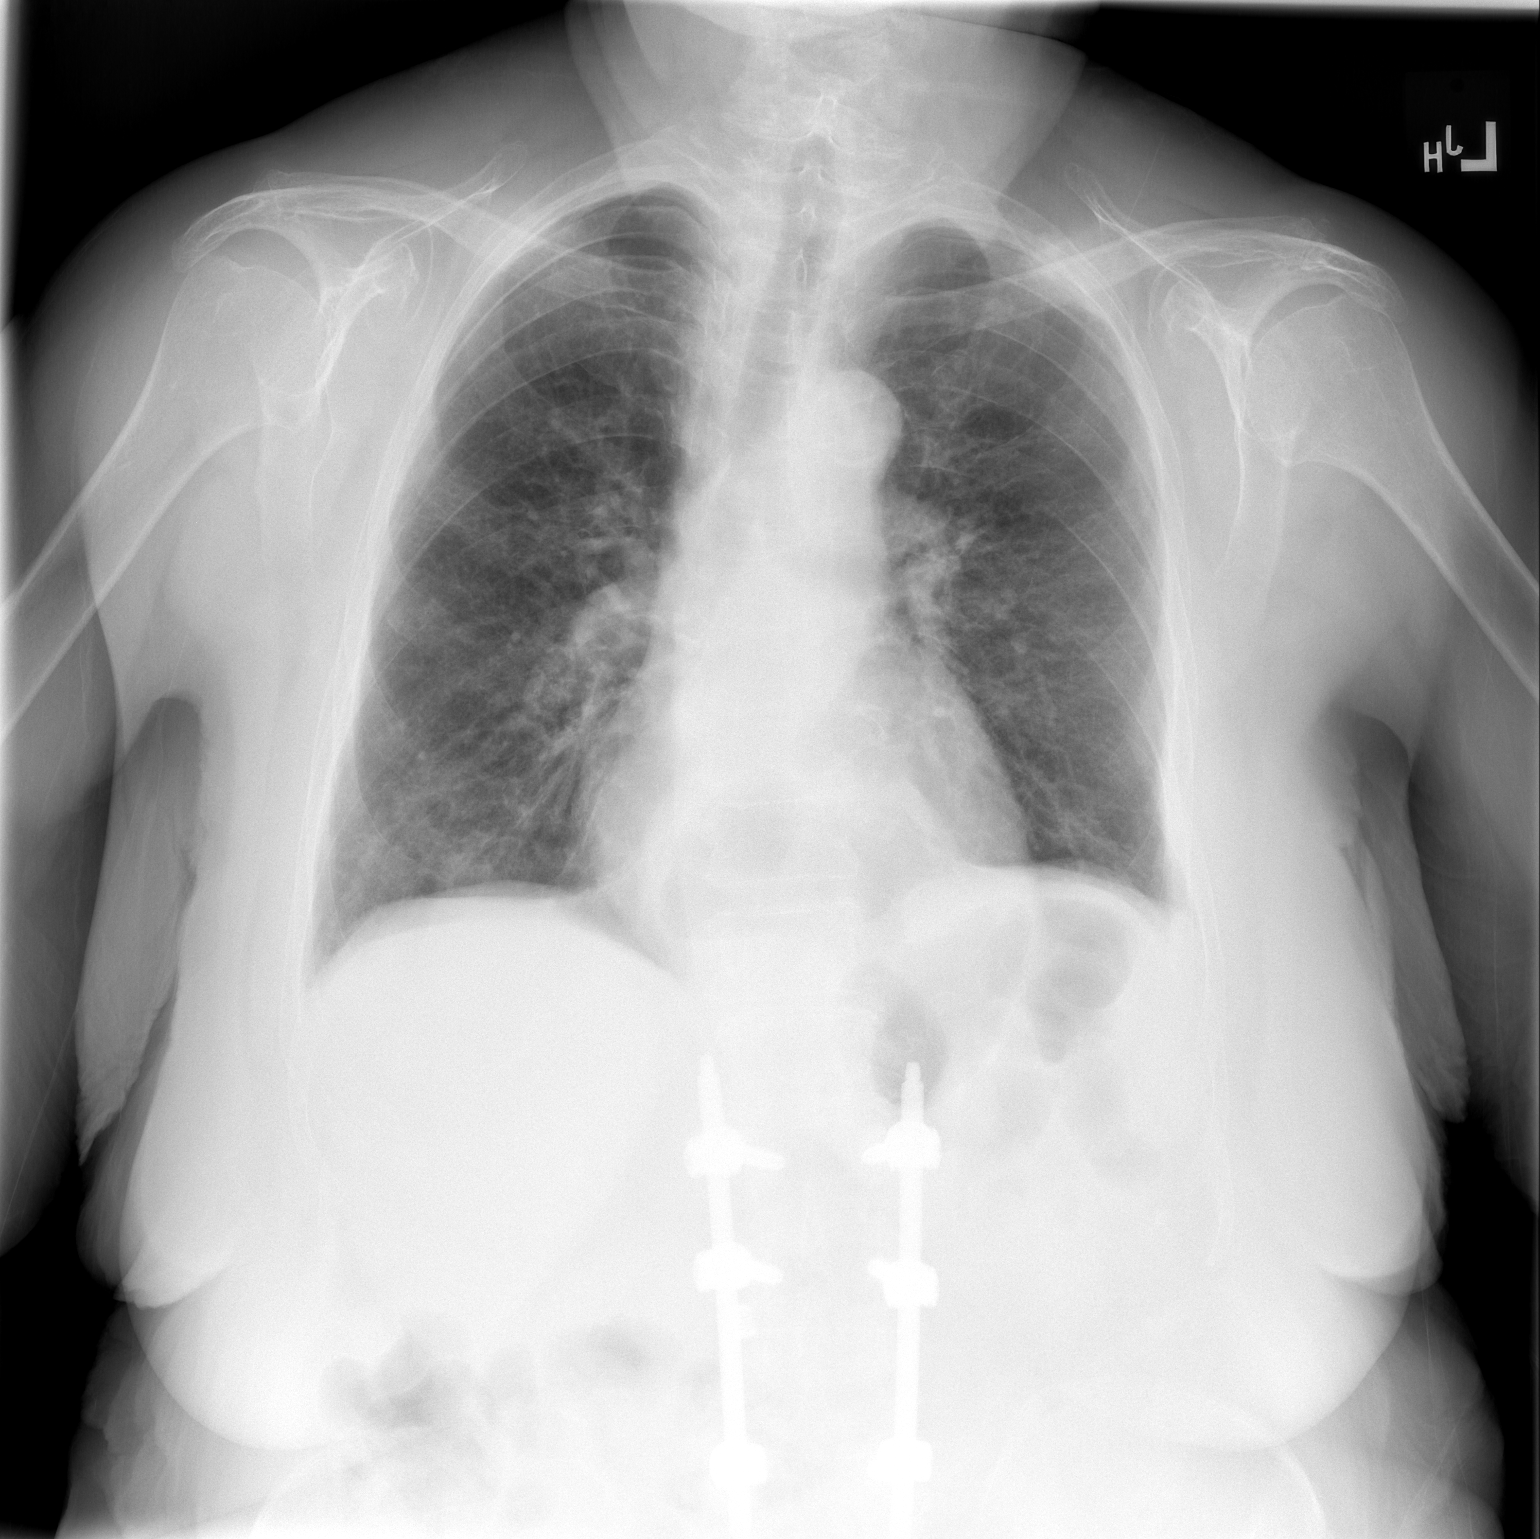

[w chest lat]
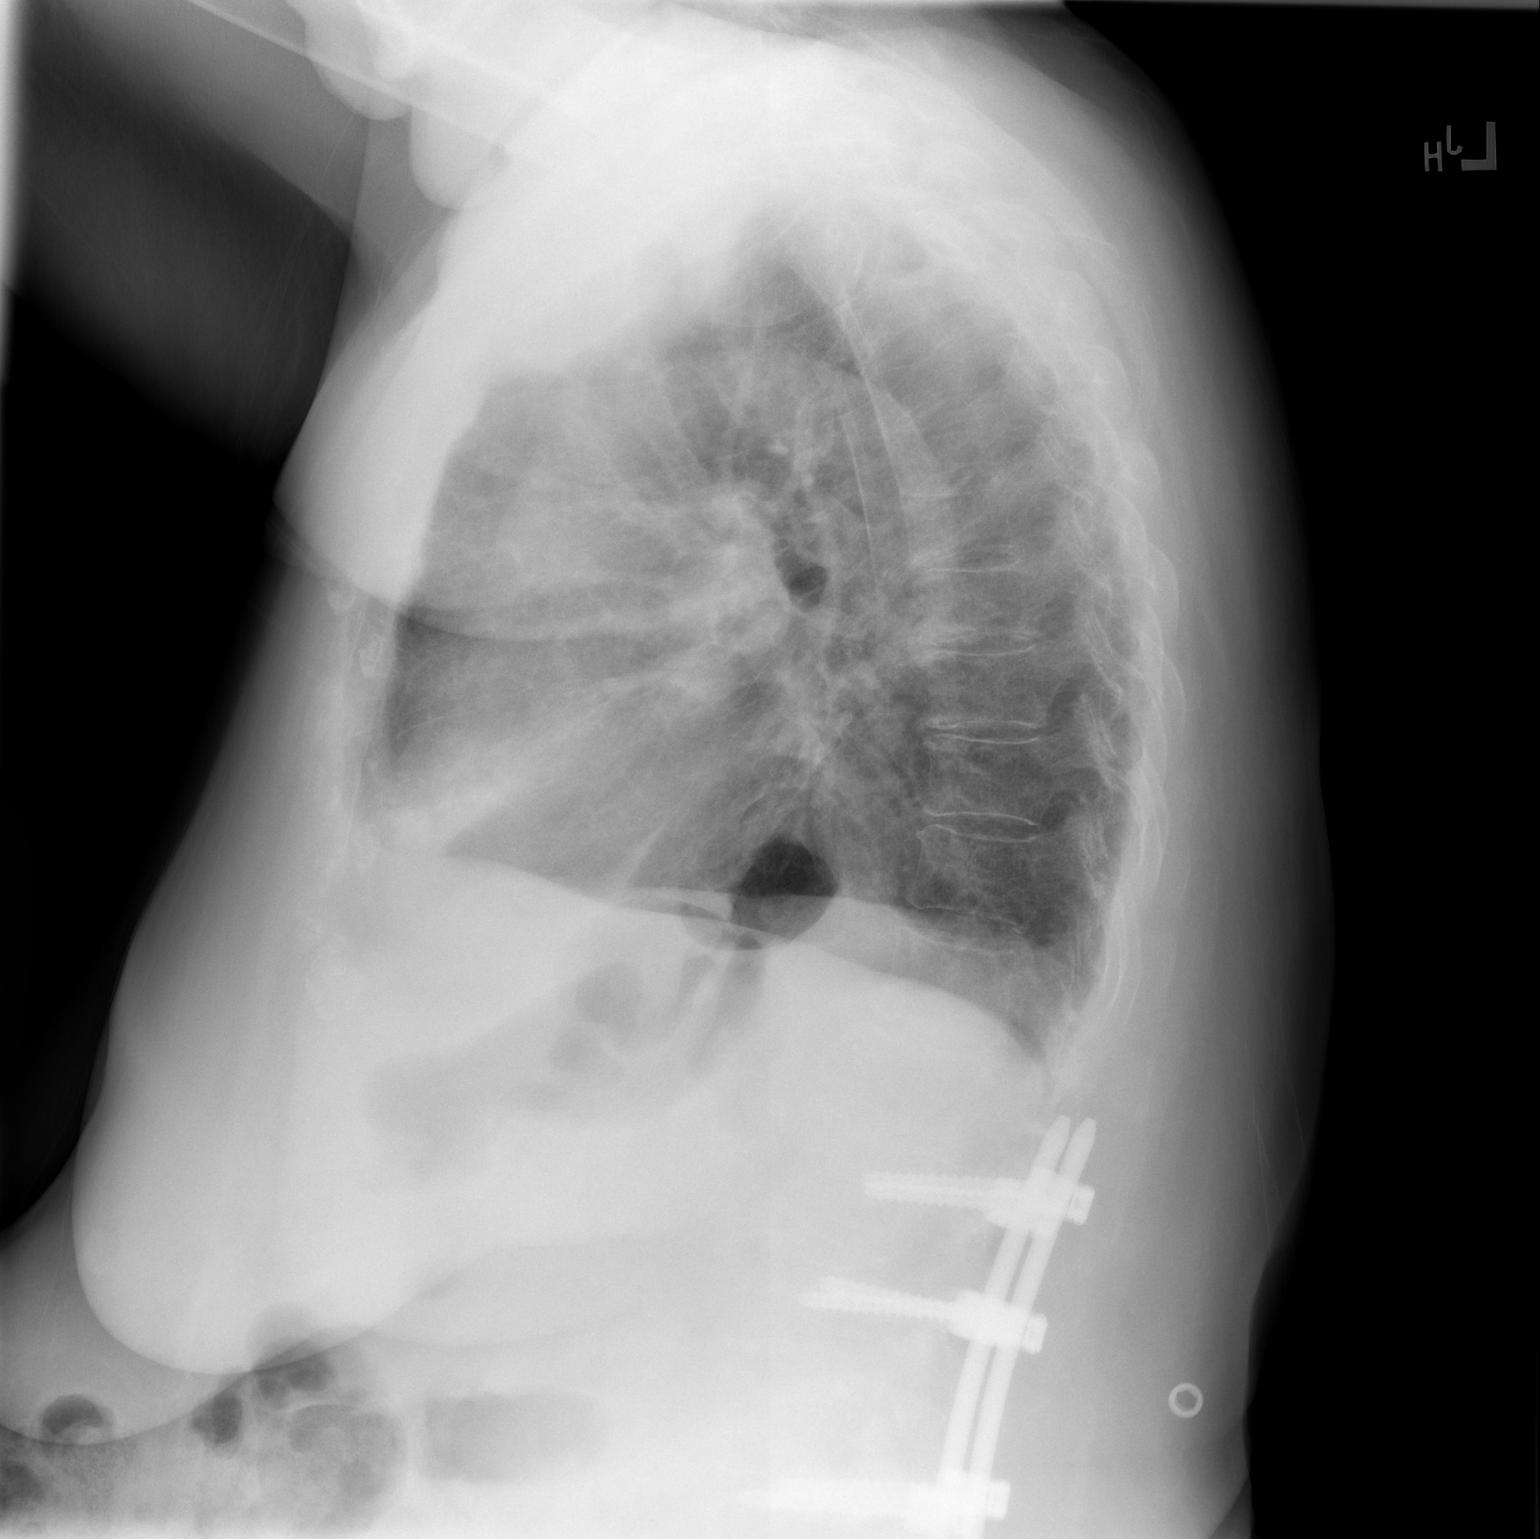

[2 of 2 positions shown; findings below may reference images not displayed]

FINDINGS: Mediastinum and hilar structures normal. Heart size normal. Sliding
hiatal hernia. Mild right middle lobe subsegmental
atelectasis/infiltrate. Small left pleural effusion versus pleural
scarring. No pneumothorax. No acute bony abnormality. No displaced
rib fracture noted. Degenerative change thoracic spine and lumbar
spine. Plate and screw fixation of the lumbar spine.
IMPRESSION: 1. Mild right middle lobe subsegmental atelectasis/infiltrate. Small
left pleural effusion versus scarring.

2.  Large sliding hiatal hernia.

## 2019-10-30 NOTE — Progress Notes (Signed)
Cardiology Office Note:    Date:  10/31/2019   ID:  Jennifer [name], DOB 06/19/1934, MRN [id number]  PCP:  Janifer Adie, MD  Cardiologist:  Sherren Mocha, MD   Electrophysiologist:  None   Referring MD: Janifer Adie, MD   Chief Complaint  Patient presents with  . Follow-up    CAD, dyspnea     History of Present Illness:    Jennifer [name] is a 85 y.o. female with:   Coronary artery disease w/ stable angina ? S/p DES to LAD in 2009 [place]) ? Dx jailed by prior LAD stent (med Rx) ? Cath 2014: LAD stent patent, D1 jailed  ? Myoview 7/19: no ischemia, EF 71 ? Echocardiogram 7/19: EF 60-65, PASP 55  Pulmonary Hypertension   Hypertension   Hyperlipidemia   OSA   GERD   Jennifer [name] was previously followed by Dr. Saunders Revel.  She established with Dr. Harrell Gave at the [facility] office in 11/2018.  However, she prefers to continue to see me at our [facility] office.  I last saw her in 08/2019.  She noted worsening shortness of breath.  A BNP was normal.  She has seen pulmonology for her pulmonary hypertension and it is felt to be multifactorial and mainly related to nocturnal hypoxemia.  I added Amlodipine to her medical regimen.    She returns for follow-up.  She is here alone.  She continues to have shortness of breath.  This is overall stable.  She notes audible wheezing at times.  Her cough has been fairly well controlled.  Her anginal symptoms are largely unchanged.  She does have some left shoulder pain with positional changes.  She has not had syncope.  She sleeps on an incline chronically.  She has not had PND.  She does note chronic lower extremity swelling.     Prior CV studies:   The following studies were reviewed today:  Event Monitor 09/16/18  The patient was enrolled for 30 days. 60% of the monitoring period yielded diagnostic tracings.  The predominant rhythm was sinus with an average rate of 72 bpm (range 51 to 125 bpm).  Isolated PACs and PVCs were  noted.  No sustained arrhythmia or prolonged pause was identified. Predominantly sinus rhythm with isolated PACs and PVCs.  No sustained arrhythmia.   Nuclear stress test 06/17/2018 Breast attenuation artifact, no ischemia, EF 71, low risk   Echocardiogram 06/17/2018 EF 60-65, normal wall motion, normal diastolic function, MAC, mild MR, PASP 55 (moderate pulmonary hypertension)   Echocardiogram 08/11/2017 ([place], [place]) Mild concentric LVH, normal wall motion, EF 60-65, normal RVSF, RVSP 26   Nuclear stress test 08/12/2017 [facility], [place]) No ischemia or scar, EF >80   Chest CTA 08/11/2017 ([place], [place]) No pulmonary embolism, multiple subcentimeter pulmonary nodules, centrilobular tree-in-bud nodules in RML unchanged from 07/22/2017, stigmata of prior granulomatous infection, CAD, moderate sized hiatal hernia   Right and left heart catheterization 11/16/2013 ([place], [place]) LAD proximal stent patent, D1 jailed from stent-unchanged LCx no significant stenosis RCA no significant stenosis EF 65 Mean PA pressure 31, mean PCWP 15  mild pulmonary hypertension   Past Medical History:  Diagnosis Date  . Ambulates with cane    straight  . At risk for falls   . CAD (coronary artery disease)   . Cataracts, bilateral   . GERD (gastroesophageal reflux disease)   . Hypercholesterolemia   . Hypertension   . Hypothyroidism   . Major depression   . Musculoskeletal back pain  CHRONIC LEFT-SIDED DISCOMFORT  . Obstructive sleep apnea on CPAP    uses CPAP nightly  . Osteopenia   . PVD (peripheral vascular disease) ([place])   . Stable angina (HCC)   . SVD (spontaneous vaginal delivery)    x 2  . Vitamin D deficiency   . Wears glasses   . Wears partial dentures    upper   Surgical Hx: The patient  has a past surgical history that includes Ankle surgery (Left); Spinal fusion; Cardiac catheterization (06/2008); Back surgery; Multiple tooth extractions; Upper gi endoscopy; Colonoscopy; Eye  surgery; and Open reduction internal fixation (orif) distal radial fracture (Left, 05/16/2019).   Current Medications: Current Meds  Medication Sig  . acetaminophen (TYLENOL) 650 MG CR tablet Take 650 mg by mouth 2 (two) times a day.  Marland Kitchen amLODipine (NORVASC) 2.5 MG tablet Take 1 tablet (2.5 mg total) by mouth daily.  Marland Kitchen aspirin EC 81 MG tablet Take 81 mg by mouth daily. CARDIAC EVENT PREVENTION  . Cholecalciferol (VITAMIN D3) 5000 units TABS Take 5,000 Units by mouth daily with breakfast.   . enalapril (VASOTEC) 20 MG tablet Take 20 mg by mouth daily.  Marland Kitchen escitalopram (LEXAPRO) 5 MG tablet Take 5 mg by mouth daily.  . furosemide (LASIX) 20 MG tablet Take 20 mg by mouth daily.  Marland Kitchen levothyroxine (SYNTHROID, LEVOTHROID) 50 MCG tablet Take 50 mcg by mouth daily before breakfast.  . mirtazapine (REMERON) 7.5 MG tablet Take 7.5 mg by mouth at bedtime.  . Multiple Vitamins-Minerals (PRESERVISION AREDS 2) CAPS Take 1 tablet by mouth daily.  . nitroGLYCERIN (NITROSTAT) 0.4 MG SL tablet Place 0.4 mg under the tongue every 5 (five) minutes as needed for chest pain.  Marland Kitchen oxyCODONE-acetaminophen (PERCOCET) 5-325 MG tablet Take 1 tablet by mouth every 4 (four) hours as needed for severe pain.  . pantoprazole (PROTONIX) 20 MG tablet Take 20 mg by mouth daily.  . pravastatin (PRAVACHOL) 80 MG tablet Take 80 mg by mouth every evening.  . ranolazine (RANEXA) 500 MG 12 hr tablet Take 500 mg by mouth 2 (two) times daily.  . traMADol (ULTRAM) 50 MG tablet Take 50 mg by mouth daily as needed for moderate pain.     Allergies:   Patient has no known allergies.   Social History   Tobacco Use  . Smoking status: Never Smoker  . Smokeless tobacco: Never Used  Substance Use Topics  . Alcohol use: Never    Frequency: Never  . Drug use: Never     Family Hx: The patient's family history includes Asthma in her father; Heart disease in her mother; Heart disease (age of onset: 39) in her brother.  ROS:   Please see the  history of present illness.    ROS All other systems reviewed and are negative.   EKGs/Labs/Other Test Reviewed:    EKG:  EKG is not ordered today.  The ekg ordered today demonstrates N/A  Recent Labs: 05/16/2019: Hemoglobin 11.6; Platelets 366 09/02/2019: ALT 8; BUN 17; Creatinine, Ser 1.07; NT-Pro BNP 133; Potassium 4.8; Sodium 142   Recent Lipid Panel Lab Results  Component Value Date/Time   CHOL 127 09/02/2019 09:03 AM   TRIG 57 09/02/2019 09:03 AM   HDL 77 09/02/2019 09:03 AM   CHOLHDL 1.6 09/02/2019 09:03 AM   LDLCALC 37 09/02/2019 09:03 AM    Physical Exam:    VS:  BP 138/70   Pulse 70   Ht _0  (1.499 m)   Wt 195 lb 6.4 oz (  88.6 kg)   LMP  (LMP Unknown)   SpO2 99%   BMI 39.47 kg/m     Wt Readings from Last 3 Encounters:  10/31/19 195 lb 6.4 oz (88.6 kg)  09/02/19 192 lb 12.8 oz (87.5 kg)  06/21/19 192 lb (87.1 kg)     Physical Exam  Constitutional: She is oriented to person, place, and time. She appears well-developed and well-nourished. No distress.  HENT:  Head: Normocephalic and atraumatic.  Eyes: No scleral icterus.  Neck: No thyromegaly present.  Cardiovascular: Normal rate, regular rhythm and normal heart sounds.  No murmur heard. Pulmonary/Chest: Effort normal and breath sounds normal. She has no wheezes. She has no rales.  Abdominal: Soft.  Musculoskeletal:        General: No edema.  Lymphadenopathy:    She has no cervical adenopathy.  Neurological: She is alert and oriented to person, place, and time.  Skin: Skin is warm and dry.  Psychiatric: She has a normal mood and affect.    ASSESSMENT & PLAN:    1. Shortness of breath 2. Pulmonary hypertension, unspecified ([place]) 3. OSA (obstructive sleep apnea) Her shortness of breath and pulmonary hypertension are multifactorial.  It has been felt by her pulmonologist that her pulmonary hypertension is largely related to nocturnal hypoxemia.  At her last visit, obtain a BNP.  This was normal.   Overall, I think her volume status is fairly stable.  She has been wearing her CPAP and is also using nocturnal oxygen.  She does hear audible wheezing at times.  At this point, she does not need further cardiac testing.  I have recommended that she contact her pulmonologist for earlier follow-up.  4. Coronary artery disease of native artery of native heart with stable angina pectoris [facility]) History of DES to the LAD in 2009 while in [place].  Myoview 2019 was low risk.  Her anginal symptoms remain stable.  Continue amlodipine, aspirin, ranolazine, pravastatin.  5. Essential hypertension Fair control.  Continue current therapy.  6. Mixed hyperlipidemia LDL optimal on most recent lab work.  Continue current Rx.     Dispo:  Return in about 6 months (around 04/29/2020) for Routine Follow Up, w/ Richardson Dopp, PA-C, (virtual or in-person).   Medication Adjustments/Labs and Tests Ordered: Current medicines are reviewed at length with the patient today.  Concerns regarding medicines are outlined above.  Tests Ordered: No orders of the defined types were placed in this encounter.  Medication Changes: No orders of the defined types were placed in this encounter.   Signed, Richardson Dopp, PA-C  10/31/2019 10:17 AM    [facility] [place], [place], [place]  [address] Phone: [phone]; Fax: [phone]

## 2019-10-31 ENCOUNTER — Ambulatory Visit (INDEPENDENT_AMBULATORY_CARE_PROVIDER_SITE_OTHER): Payer: Medicare (Managed Care) | Admitting: Physician Assistant

## 2019-10-31 ENCOUNTER — Other Ambulatory Visit: Payer: Self-pay

## 2019-10-31 ENCOUNTER — Encounter: Payer: Self-pay | Admitting: Physician Assistant

## 2019-10-31 VITALS — BP 138/70 | HR 70 | Ht 59.0 in | Wt 195.4 lb

## 2019-10-31 DIAGNOSIS — I10 Essential (primary) hypertension: Secondary | ICD-10-CM

## 2019-10-31 DIAGNOSIS — I272 Pulmonary hypertension, unspecified: Secondary | ICD-10-CM

## 2019-10-31 DIAGNOSIS — I25118 Atherosclerotic heart disease of native coronary artery with other forms of angina pectoris: Secondary | ICD-10-CM | POA: Diagnosis not present

## 2019-10-31 DIAGNOSIS — G4733 Obstructive sleep apnea (adult) (pediatric): Secondary | ICD-10-CM

## 2019-10-31 DIAGNOSIS — R0602 Shortness of breath: Secondary | ICD-10-CM

## 2019-10-31 DIAGNOSIS — E782 Mixed hyperlipidemia: Secondary | ICD-10-CM

## 2019-10-31 NOTE — Patient Instructions (Addendum)
Medication Instructions:  No changes You can take an extra Lasix 20 mg once daily as needed for worsening leg swelling.  *If you need a refill on your cardiac medications before your next appointment, please call your pharmacy*  Lab Work: None   If you have labs (blood work) drawn today and your tests are completely normal, you will receive your results only by: Marland Kitchen MyChart Message (if you have MyChart) OR . A paper copy in the mail If you have any lab test that is abnormal or we need to change your treatment, we will call you to review the results.  Testing/Procedures: None   Follow-Up: At [facility], you and your health needs are our priority.  As part of our continuing mission to provide you with exceptional heart care, we have created designated Provider Care Teams.  These Care Teams include your primary Cardiologist (physician) and Advanced Practice Providers (APPs -  Physician Assistants and Nurse Practitioners) who all work together to provide you with the care you need, when you need it.  Your next appointment:   6 month(s)  The format for your next appointment:   Either In Person or Virtual  Provider:   Richardson Dopp, PA-C  Other Instructions  Please call your Pulmonologist (Lung Doctor) for earlier follow up on your shortness of breath.

## 2019-11-24 ENCOUNTER — Other Ambulatory Visit: Payer: Self-pay | Admitting: Gerontology

## 2019-11-24 ENCOUNTER — Ambulatory Visit
Admission: RE | Admit: 2019-11-24 | Discharge: 2019-11-24 | Disposition: A | Discharge status: Home / Self Care | Payer: No Typology Code available for payment source | Source: Ambulatory Visit | Attending: Gerontology | Admitting: Gerontology

## 2019-11-24 DIAGNOSIS — M25561 Pain in right knee: Secondary | ICD-10-CM

## 2019-11-24 DIAGNOSIS — R1031 Right lower quadrant pain: Secondary | ICD-10-CM

## 2019-11-24 DIAGNOSIS — W19XXXA Unspecified fall, initial encounter: Secondary | ICD-10-CM

## 2019-11-25 ENCOUNTER — Other Ambulatory Visit: Payer: Self-pay | Admitting: Gerontology

## 2019-11-25 DIAGNOSIS — M25552 Pain in left hip: Secondary | ICD-10-CM

## 2019-11-25 DIAGNOSIS — W19XXXA Unspecified fall, initial encounter: Secondary | ICD-10-CM

## 2019-11-29 ENCOUNTER — Other Ambulatory Visit: Payer: Self-pay

## 2019-11-29 ENCOUNTER — Ambulatory Visit (INDEPENDENT_AMBULATORY_CARE_PROVIDER_SITE_OTHER): Payer: Medicare (Managed Care) | Admitting: Pulmonary Disease

## 2019-11-29 ENCOUNTER — Encounter: Payer: Self-pay | Admitting: Pulmonary Disease

## 2019-11-29 VITALS — BP 142/78 | HR 77 | Temp 97.5°F | Ht 59.0 in | Wt 194.8 lb

## 2019-11-29 DIAGNOSIS — R0602 Shortness of breath: Secondary | ICD-10-CM | POA: Diagnosis not present

## 2019-11-29 DIAGNOSIS — R059 Cough, unspecified: Secondary | ICD-10-CM

## 2019-11-29 DIAGNOSIS — R05 Cough: Secondary | ICD-10-CM | POA: Diagnosis not present

## 2019-11-29 DIAGNOSIS — G4733 Obstructive sleep apnea (adult) (pediatric): Secondary | ICD-10-CM | POA: Diagnosis not present

## 2019-11-29 DIAGNOSIS — R0789 Other chest pain: Secondary | ICD-10-CM

## 2019-11-29 DIAGNOSIS — Z9989 Dependence on other enabling machines and devices: Secondary | ICD-10-CM

## 2019-11-29 MED ORDER — ALBUTEROL SULFATE HFA 108 (90 BASE) MCG/ACT IN AERS: 2.0000 | INHALATION_SPRAY | Freq: Four times a day (QID) | RESPIRATORY_TRACT | 5 refills | Status: DC | PRN

## 2019-11-29 MED ORDER — AEROCHAMBER MV MISC: 0 refills | Status: DC

## 2019-11-29 NOTE — Patient Instructions (Signed)
We will give the albuterol with a spacer to be used with shortness of breath  Continue with CPAP  I will see you in 6 weeks  We will get a breathing study on the day of your visit in 6 weeks  Call with significant concerns

## 2019-11-29 NOTE — Progress Notes (Signed)
Jennifer [name]    [id number]    [birth year]  Primary Care Physician:Koehler, Marvis Repress, MD  Referring Physician: Jethro Bastos, MD [address] [place],  [place] [address]  Chief complaint:    Follow-up obstructive sleep apnea Follow-up of pulmonary hypertension She has been feeling relatively well   HPI: She still does have intermittent cough Has regular wheezes Still has shoulder discomfort on the left side Recently started a new CPAP-tolerating it well-has only had this for a few days   Otherwise, has been relatively stable No recent significant shortness of breath, no significant pain or discomfort apart from relating to recent fall which was accidental  Patient recently moved here from [place], did speak with Dr. Avie Arenas regarding a CT Repeat CT significant for some atelectasis  She has a history of moderate pulmonary hypertension-echo did confirm moderate pulmonary hypertension-2019  Coughing is better overall Still short of breath with activity   Outpatient Encounter Medications as of 11/29/2019  Medication Sig  . acetaminophen (TYLENOL) 650 MG CR tablet Take 650 mg by mouth 2 (two) times a day.  . albuterol (VENTOLIN HFA) 108 (90 Base) MCG/ACT inhaler Inhale 2 puffs into the lungs every 6 (six) hours as needed for wheezing or shortness of breath.  Marland Kitchen amLODipine (NORVASC) 2.5 MG tablet Take 1 tablet (2.5 mg total) by mouth daily.  Marland Kitchen aspirin EC 81 MG tablet Take 81 mg by mouth daily. CARDIAC EVENT PREVENTION  . Cholecalciferol (VITAMIN D3) 5000 units TABS Take 5,000 Units by mouth daily with breakfast.   . enalapril (VASOTEC) 20 MG tablet Take 20 mg by mouth daily.  Marland Kitchen escitalopram (LEXAPRO) 5 MG tablet Take 5 mg by mouth daily.  . furosemide (LASIX) 20 MG tablet Take 20 mg by mouth daily.  Marland Kitchen levothyroxine (SYNTHROID, LEVOTHROID) 50 MCG tablet Take 50 mcg by mouth daily before breakfast.  . mirtazapine (REMERON) 7.5 MG tablet Take 7.5 mg by mouth at  bedtime.  . Multiple Vitamins-Minerals (PRESERVISION AREDS 2) CAPS Take 1 tablet by mouth daily.  . nitroGLYCERIN (NITROSTAT) 0.4 MG SL tablet Place 0.4 mg under the tongue every 5 (five) minutes as needed for chest pain.  Marland Kitchen oxyCODONE-acetaminophen (PERCOCET) 5-325 MG tablet Take 1 tablet by mouth every 4 (four) hours as needed for severe pain.  . pantoprazole (PROTONIX) 20 MG tablet Take 20 mg by mouth daily.  . pravastatin (PRAVACHOL) 80 MG tablet Take 80 mg by mouth every evening.  . ranolazine (RANEXA) 500 MG 12 hr tablet Take 500 mg by mouth 2 (two) times daily.  Marland Kitchen Spacer/Aero-Holding Chambers (AEROCHAMBER MV) inhaler Use as instructed  . traMADol (ULTRAM) 50 MG tablet Take 50 mg by mouth daily as needed for moderate pain.   No facility-administered encounter medications on file as of 11/29/2019.    Allergies as of 11/29/2019  . (No Known Allergies)    Past Medical History:  Diagnosis Date  . Ambulates with cane    straight  . At risk for falls   . CAD (coronary artery disease)   . Cataracts, bilateral   . GERD (gastroesophageal reflux disease)   . Hypercholesterolemia   . Hypertension   . Hypothyroidism   . Major depression   . Musculoskeletal back pain    CHRONIC LEFT-SIDED DISCOMFORT  . Obstructive sleep apnea on CPAP    uses CPAP nightly  . Osteopenia   . PVD (peripheral vascular disease) (HCC)   . Stable angina (HCC)   .  SVD (spontaneous vaginal delivery)    x 2  . Vitamin D deficiency   . Wears glasses   . Wears partial dentures    upper    Past Surgical History:  Procedure Laterality Date  . ANKLE SURGERY Left   . BACK SURGERY    . CARDIAC CATHETERIZATION  06/2008   PCI to LAD with 3.0 x 12 mm DES  . COLONOSCOPY    . EYE SURGERY     remove cataracts - bilateral  . MULTIPLE TOOTH EXTRACTIONS     upper teeth - upper dentures  . OPEN REDUCTION INTERNAL FIXATION (ORIF) DISTAL RADIAL FRACTURE Left 05/16/2019   Procedure: OPEN REDUCTION INTERNAL FIXATION  (ORIF) DISTAL RADIAL FRACTURE;  Surgeon: Charlotte Crumb, MD;  Location: [facility];  Service: Orthopedics;  Laterality: Left;  . SPINAL FUSION    . UPPER GI ENDOSCOPY      Family History  Problem Relation Age of Onset  . Heart disease Mother   . Asthma Father   . Heart disease Brother 8       CAD    Social History   Socioeconomic History  . Marital status: Widowed    Spouse name: Not on file  . Number of children: 2  . Years of education: Not on file  . Highest education level: Not on file  Occupational History  . Not on file  Tobacco Use  . Smoking status: Never Smoker  . Smokeless tobacco: Never Used  Substance and Sexual Activity  . Alcohol use: Never  . Drug use: Never  . Sexual activity: Not on file  Other Topics Concern  . Not on file  Social History Narrative  . Not on file   Social Determinants of Health   Financial Resource Strain:   . Difficulty of Paying Living Expenses: Not on file  Food Insecurity:   . Worried About Charity fundraiser in the Last Year: Not on file  . Ran Out of Food in the Last Year: Not on file  Transportation Needs:   . Lack of Transportation (Medical): Not on file  . Lack of Transportation (Non-Medical): Not on file  Physical Activity:   . Days of Exercise per Week: Not on file  . Minutes of Exercise per Session: Not on file  Stress:   . Feeling of Stress : Not on file  Social Connections:   . Frequency of Communication with Friends and Family: Not on file  . Frequency of Social Gatherings with Friends and Family: Not on file  . Attends Religious Services: Not on file  . Active Member of Clubs or Organizations: Not on file  . Attends Archivist Meetings: Not on file  . Marital Status: Not on file  Intimate Partner Violence:   . Fear of Current or Ex-Partner: Not on file  . Emotionally Abused: Not on file  . Physically Abused: Not on file  . Sexually Abused: Not on file    Review of Systems  Constitutional:  Negative.   HENT: Negative.  Negative for congestion.   Eyes: Negative.   Respiratory: Positive for cough and shortness of breath.   Cardiovascular: Negative for chest pain.       Chest wall discomfort  Gastrointestinal: Negative.   Endocrine: Negative.   Musculoskeletal: Positive for arthralgias.  All other systems reviewed and are negative.   Vitals:   11/29/19 1026  BP: (!) 142/78  Pulse: 77  Temp: (!) 97.5 F (36.4 C)  SpO2: 92%  Physical Exam  Constitutional: She appears well-developed and well-nourished.  Overweight  HENT:  Head: Normocephalic and atraumatic.  Eyes: Pupils are equal, round, and reactive to light. Conjunctivae and EOM are normal. Right eye exhibits no discharge. Left eye exhibits no discharge.  Neck: No tracheal deviation present. No thyromegaly present.  Cardiovascular: Normal rate and regular rhythm.  Pulmonary/Chest: Effort normal and breath sounds normal. No respiratory distress. She has no wheezes. She has no rales. She exhibits no tenderness.  Abdominal: Soft. Bowel sounds are normal. She exhibits no distension. There is no abdominal tenderness. There is no rebound.  Musculoskeletal:        General: Edema present.     Cervical back: Normal range of motion and neck supple.  Neurological:  Uses a walker   Data Reviewed: CT scan of the chest reviewed showing midlung atelectasis Echocardiogram with moderate pulmonary hypertension-results reviewed Compliance with CPAP is limited-new machine-set between 5 and 15, residual AHI of 5.6-only 4 days of data  Assessment:   Obstructive sleep apnea-has not been able to tolerate CPAP since facial bone injury -Continue CPAP  Nocturnal desaturations -CPAP should help this  Pulmonary hypertension Multifactorial pulmonary hypertension May be related to nocturnal hypoxemia -Encouraged to continue using oxygen at night -Encouraged to continue using CPAP  Shortness of breath with wheezing -Empiric  albuterol -Obtain PFT  Abnormal CT scan of the chest revealing atelectasis -Had a recent chest x-ray in January 2020-atelectasis -We will follow  Plan/Recommendations:  Try CPAP whenever she has pain from a facial bone fracture is not Continue oxygen use  Symptomatic treatment for cough  Call with any significant symptoms  We will see her back in the office in 6 weeks to see how she is doing  We will repeat CT in March or April 2020-to follow-up on atelectasis   Virl DiamondAdewale Yasheka Fossett MD [facility] 11/29/2019, 10:55 AM  CC: Jethro BastosKoehler, Robert N, MD

## 2019-12-12 ENCOUNTER — Other Ambulatory Visit: Payer: Self-pay

## 2019-12-12 ENCOUNTER — Ambulatory Visit
Admission: RE | Admit: 2019-12-12 | Discharge: 2019-12-12 | Disposition: A | Discharge status: Home / Self Care | Payer: Medicare (Managed Care) | Source: Ambulatory Visit | Attending: Gerontology | Admitting: Gerontology

## 2019-12-12 DIAGNOSIS — W19XXXA Unspecified fall, initial encounter: Secondary | ICD-10-CM

## 2019-12-12 DIAGNOSIS — M25552 Pain in left hip: Secondary | ICD-10-CM

## 2019-12-15 ENCOUNTER — Other Ambulatory Visit: Payer: Self-pay | Admitting: Gerontology

## 2019-12-15 ENCOUNTER — Other Ambulatory Visit (HOSPITAL_COMMUNITY): Payer: Self-pay | Admitting: Gerontology

## 2019-12-15 DIAGNOSIS — R1031 Right lower quadrant pain: Secondary | ICD-10-CM

## 2019-12-20 ENCOUNTER — Other Ambulatory Visit: Payer: Self-pay

## 2019-12-20 ENCOUNTER — Telehealth: Payer: Self-pay

## 2019-12-20 ENCOUNTER — Ambulatory Visit (HOSPITAL_COMMUNITY)
Admission: RE | Admit: 2019-12-20 | Discharge: 2019-12-20 | Disposition: A | Discharge status: Home / Self Care | Payer: Medicare (Managed Care) | Source: Ambulatory Visit | Attending: Gerontology | Admitting: Gerontology

## 2019-12-20 DIAGNOSIS — R1031 Right lower quadrant pain: Secondary | ICD-10-CM | POA: Diagnosis present

## 2019-12-20 NOTE — Telephone Encounter (Signed)
Called pt to change scheduled appointment to virtual. Pt voiced that she would prefer to be seen in office. Appointment rescheduled for 1/15 at 8:20 am.

## 2019-12-22 ENCOUNTER — Ambulatory Visit: Payer: Medicare (Managed Care) | Admitting: Cardiology

## 2019-12-23 ENCOUNTER — Ambulatory Visit: Payer: Medicare (Managed Care) | Admitting: Cardiology

## 2020-01-02 ENCOUNTER — Ambulatory Visit (INDEPENDENT_AMBULATORY_CARE_PROVIDER_SITE_OTHER): Payer: Medicare (Managed Care) | Admitting: Cardiology

## 2020-01-02 ENCOUNTER — Encounter: Payer: Self-pay | Admitting: Cardiology

## 2020-01-02 ENCOUNTER — Other Ambulatory Visit: Payer: Self-pay

## 2020-01-02 VITALS — BP 127/73 | HR 82 | Temp 97.7°F | Ht 59.0 in | Wt 194.0 lb

## 2020-01-02 DIAGNOSIS — R0602 Shortness of breath: Secondary | ICD-10-CM | POA: Diagnosis not present

## 2020-01-02 DIAGNOSIS — I2510 Atherosclerotic heart disease of native coronary artery without angina pectoris: Secondary | ICD-10-CM

## 2020-01-02 DIAGNOSIS — Z7189 Other specified counseling: Secondary | ICD-10-CM | POA: Diagnosis not present

## 2020-01-02 DIAGNOSIS — I10 Essential (primary) hypertension: Secondary | ICD-10-CM

## 2020-01-02 NOTE — Progress Notes (Signed)
Cardiology Office Note:    Date:  01/02/2020   ID:  Jennifer [name], DOB Apr 22, 1934, MRN [id number]  PCP:  Jethro Bastos, MD  Cardiologist:  Jodelle Red, MD PhD/also [name] at [facility].  Referring MD: Jethro Bastos, MD   CC: follow up  History of Present Illness:    Jennifer [name] is a 86 y.o. female with a hx of CAD with stable angina, HTN, HLD, sleep apnea, GERD with hiatal hernia/esophageal stricture who is seen as a new patient to me/transfer from Dr. Okey Dupre at the request of Jethro Bastos, MD for the evaluation and management of CAD.  Cardiac history: PCI to LAD in 2009 in [place]. Had repeat cath in 2014 due to accelerating angina, stent patent, jailed diagonal, no intervention. Nuc stress 2018 without ischemic, echo 2018 with normal LV function and RVSP 26 mmHg. She established with Dr. Okey Dupre in 05/2018 after moving from [place]. Nuclear stress done here in 06/2018 showed breast attenuation but no ischemia. Echo 06/2018 showed normal LV function with pHtn (PASP 55). She had her ranexa dose decreased due to dizziness 08/2018. She had palpitations in 09/2018 and had 30 day event monitor ordered, which showed no significant arrhythmias.   Today: Last seen by [name] 10/31/19 for shortness of breath. BNP normal. No further testing indicated at that time.  Today she reports that she is overall not feeling well. Has had shortness of breath and wheezing without change. Can't walk more than half a block. Has chest tightness with her shortness of breath. Sometimes lightheaded, no syncope.  She feels this is been related to a gradual worsening in her breathing. Seen by Dr. Wynona Neat 11/29/19. Discussed her pulmonary hypertension, shortness of breath. Planned for albuterol and PFTs. She reports that she has the spacer but not the albuterol.   Using O2 and CPAP at night as ordered.  Reviewed medications today.  Past Medical History:  Diagnosis Date  . Ambulates with  cane    straight  . At risk for falls   . CAD (coronary artery disease)   . Cataracts, bilateral   . GERD (gastroesophageal reflux disease)   . Hypercholesterolemia   . Hypertension   . Hypothyroidism   . Major depression   . Musculoskeletal back pain    CHRONIC LEFT-SIDED DISCOMFORT  . Obstructive sleep apnea on CPAP    uses CPAP nightly  . Osteopenia   . PVD (peripheral vascular disease) (HCC)   . Stable angina (HCC)   . SVD (spontaneous vaginal delivery)    x 2  . Vitamin D deficiency   . Wears glasses   . Wears partial dentures    upper    Past Surgical History:  Procedure Laterality Date  . ANKLE SURGERY Left   . BACK SURGERY    . CARDIAC CATHETERIZATION  06/2008   PCI to LAD with 3.0 x 12 mm DES  . COLONOSCOPY    . EYE SURGERY     remove cataracts - bilateral  . MULTIPLE TOOTH EXTRACTIONS     upper teeth - upper dentures  . OPEN REDUCTION INTERNAL FIXATION (ORIF) DISTAL RADIAL FRACTURE Left 05/16/2019   Procedure: OPEN REDUCTION INTERNAL FIXATION (ORIF) DISTAL RADIAL FRACTURE;  Surgeon: Dairl Ponder, MD;  Location: [facility];  Service: Orthopedics;  Laterality: Left;  . SPINAL FUSION    . UPPER GI ENDOSCOPY      Current Medications: Current Outpatient Medications on File Prior to Visit  Medication Sig  . acetaminophen (TYLENOL)  650 MG CR tablet Take 650 mg by mouth 2 (two) times a day.  . albuterol (VENTOLIN HFA) 108 (90 Base) MCG/ACT inhaler Inhale 2 puffs into the lungs every 6 (six) hours as needed for wheezing or shortness of breath.  Marland Kitchen amLODipine (NORVASC) 2.5 MG tablet Take 1 tablet (2.5 mg total) by mouth daily.  Marland Kitchen aspirin EC 81 MG tablet Take 81 mg by mouth daily. CARDIAC EVENT PREVENTION  . Cholecalciferol (VITAMIN D3) 5000 units TABS Take 5,000 Units by mouth daily with breakfast.   . enalapril (VASOTEC) 20 MG tablet Take 20 mg by mouth daily.  Marland Kitchen escitalopram (LEXAPRO) 5 MG tablet Take 5 mg by mouth daily.  . furosemide (LASIX) 20 MG tablet Take 20  mg by mouth daily.  Marland Kitchen levothyroxine (SYNTHROID, LEVOTHROID) 50 MCG tablet Take 50 mcg by mouth daily before breakfast.  . mirtazapine (REMERON) 7.5 MG tablet Take 7.5 mg by mouth at bedtime.  . Multiple Vitamins-Minerals (PRESERVISION AREDS 2) CAPS Take 1 tablet by mouth daily.  . nitroGLYCERIN (NITROSTAT) 0.4 MG SL tablet Place 0.4 mg under the tongue every 5 (five) minutes as needed for chest pain.  Marland Kitchen oxyCODONE-acetaminophen (PERCOCET) 5-325 MG tablet Take 1 tablet by mouth every 4 (four) hours as needed for severe pain.  . pantoprazole (PROTONIX) 20 MG tablet Take 20 mg by mouth daily.  . pravastatin (PRAVACHOL) 80 MG tablet Take 80 mg by mouth every evening.  . ranolazine (RANEXA) 500 MG 12 hr tablet Take 500 mg by mouth 2 (two) times daily.  Marland Kitchen Spacer/Aero-Holding Chambers (AEROCHAMBER MV) inhaler Use as instructed  . traMADol (ULTRAM) 50 MG tablet Take 50 mg by mouth daily as needed for moderate pain.   No current facility-administered medications on file prior to visit.     Allergies:   Patient has no known allergies.   Social History   Tobacco Use  . Smoking status: Never Smoker  . Smokeless tobacco: Never Used  Substance Use Topics  . Alcohol use: Never  . Drug use: Never    Family History: The patient's family history includes Asthma in her father; Heart disease in her mother; Heart disease (age of onset: 63) in her brother.  ROS:   Please see the history of present illness.  Additional pertinent ROS negative except as documented.   EKGs/Labs/Other Studies Reviewed:    The following studies were reviewed today: Monitor 10/21/18  The patient was enrolled for 30 days. 60% of the monitoring period yielded diagnostic tracings.  The predominant rhythm was sinus with an average rate of 72 bpm (range 51 to 125 bpm).  Isolated PACs and PVCs were noted.  No sustained arrhythmia or prolonged pause was identified.   Predominantly sinus rhythm with isolated PACs and PVCs.   No sustained arrhythmia.  lexiscan 7.11.19  The left ventricular ejection fraction is hyperdynamic (>65%).  Nuclear stress EF: 71%.  There was no ST segment deviation noted during stress.  There is a small defect of mild severity present in the basal anteroseptal, mid anteroseptal and apical septal location. The defect is non-reversible that is consistent with breast attenuation artifact. No ischemia noted.  This is a low risk study.   Echo 06/17/18 - Left ventricle: The cavity size was normal. Wall thickness was   normal. Systolic function was normal. The estimated ejection   fraction was in the range of 60% to 65%. Wall motion was normal;   there were no regional wall motion abnormalities. Left   ventricular diastolic function  parameters were normal. - Mitral valve: Calcified annulus. There was mild regurgitation. - Pulmonary arteries: Systolic pressure was moderately increased.   PA peak pressure: 55 mm Hg (S).  Impressions: - Normal LV systolic function; mild MR; trace TR with moderate   pulmonary hypertension.  EKG:  EKG is personally reviewed.  The ekg ordered 09/02/19 demonstrates normal sinus rhythm  Recent Labs: 05/16/2019: Hemoglobin 11.6; Platelets 366 09/02/2019: ALT 8; BUN 17; Creatinine, Ser 1.07; NT-Pro BNP 133; Potassium 4.8; Sodium 142  Recent Lipid Panel    Component Value Date/Time   CHOL 127 09/02/2019 0903   TRIG 57 09/02/2019 0903   HDL 77 09/02/2019 0903   CHOLHDL 1.6 09/02/2019 0903   LDLCALC 37 09/02/2019 0903    Physical Exam:    VS:  BP 127/73   Pulse 82   Temp 97.7 F (36.5 C)   Ht 4\' 11"  (1.499 m)   Wt 194 lb (88 kg)   LMP  (LMP Unknown)   SpO2 94%   BMI 39.18 kg/m     Wt Readings from Last 3 Encounters:  01/02/20 194 lb (88 kg)  11/29/19 194 lb 12.8 oz (88.4 kg)  10/31/19 195 lb 6.4 oz (88.6 kg)    GEN: Well nourished, well developed in no acute distress HEENT: Normal, moist mucous membranes NECK: No JVD CARDIAC: regular  rhythm, normal S1 and S2, no rubs or gallops. No murmur. VASCULAR: Radial and DP pulses 2+ bilaterally. No carotid bruits RESPIRATORY:  Clear but bilateral expiratory wheeze ABDOMEN: Soft, non-tender, non-distended MUSCULOSKELETAL:  Ambulates independently SKIN: Warm and dry, trace bilateral LE edema NEUROLOGIC:  No focal neuro deficits noted. PSYCHIATRIC:  Normal affect   ASSESSMENT:    1. Shortness of breath   2. Coronary artery disease involving native coronary artery of native heart without angina pectoris   3. Essential hypertension   4. Counseling on health promotion and disease prevention    PLAN:    Dyspnea: normal BNP with 11/02/19. Had expiratory wheeze today. Ordered for albuterol with spacer but does not have albuterol -she does have refills on albuterol listed, gave her information to get the refills -exam, prior workup suggests non-cardiac etiology -follows with pulmonology for sleep apnea, pulmonary hypertension  CAD: denies angina at this time. Tolerating medications well -continue aspirin 81 mg, pravastatin 80 mg, ranolazine 500 mg BID -has SL NG -counseled on red flag warning signs requiring immediate medical attention -recent echo, stress test in 2019 -has PRN SL NG, has not required  Hypertension: well controlled today, goal <130/80 -continue enalapril, furosemide, low dose amlodipine -had issues with beta blocker in the past  Secondary prevention -recommend heart healthy/Mediterranean diet, with whole grains, fruits, vegetable, fish, lean meats, nuts, and olive oil. Limit salt. -recommend avoidance of tobacco products. Avoid excess alcohol. -Additional risk factor control:  -Hyperlipidemia: LDL goal <70. On atorvastatin  -Blood pressure control: as above  -Weight: BMI 39, counseled on diet. Limited ability for exercise given mobility, encouraged to safely do what she can.  Plan for follow up: 6 mos or sooner PRN  Copy of note will be sent to: [facility] [address] [place], [place] Fax [phone]  Medication Adjustments/Labs and Tests Ordered: Current medicines are reviewed at length with the patient today.  Concerns regarding medicines are outlined above.  No orders of the defined types were placed in this encounter.  No orders of the defined types were placed in this encounter.   Patient  Instructions  Medication Instructions:  Your Physician recommend you continue on your current medication as directed.    -You have albuterol with refills ordered from Dr. Wynona Neat. It was sent to [facility]. If needed you may need to contact the pulmonology office to have this sent to another pharmacy.  *If you need a refill on your cardiac medications before your next appointment, please call your pharmacy*  Lab Work: None  Testing/Procedures: None  Follow-Up: At [facility], you and your health needs are our priority.  As part of our continuing mission to provide you with exceptional heart care, we have created designated Provider Care Teams.  These Care Teams include your primary Cardiologist (physician) and Advanced Practice Providers (APPs -  Physician Assistants and Nurse Practitioners) who all work together to provide you with the care you need, when you need it.  Your next appointment:   6 month(s)  The format for your next appointment:   In Person  Provider:   Jodelle Red, MD      Signed, Jodelle Red, MD PhD 01/02/2020   [facility]

## 2020-01-02 NOTE — Patient Instructions (Addendum)
Medication Instructions:  Your Physician recommend you continue on your current medication as directed.    -You have albuterol with refills ordered from Dr. Wynona Neat. It was sent to [facility]. If needed you may need to contact the pulmonology office to have this sent to another pharmacy.  *If you need a refill on your cardiac medications before your next appointment, please call your pharmacy*  Lab Work: None  Testing/Procedures: None  Follow-Up: At [facility], you and your health needs are our priority.  As part of our continuing mission to provide you with exceptional heart care, we have created designated Provider Care Teams.  These Care Teams include your primary Cardiologist (physician) and Advanced Practice Providers (APPs -  Physician Assistants and Nurse Practitioners) who all work together to provide you with the care you need, when you need it.  Your next appointment:   6 month(s)  The format for your next appointment:   In Person  Provider:   Jodelle Red, MD

## 2020-01-04 ENCOUNTER — Other Ambulatory Visit: Payer: Self-pay | Admitting: Gerontology

## 2020-01-04 ENCOUNTER — Other Ambulatory Visit (HOSPITAL_COMMUNITY): Payer: Self-pay | Admitting: Gerontology

## 2020-01-04 DIAGNOSIS — N83201 Unspecified ovarian cyst, right side: Secondary | ICD-10-CM

## 2020-01-11 ENCOUNTER — Encounter: Payer: Self-pay | Admitting: Cardiology

## 2020-01-12 ENCOUNTER — Ambulatory Visit (HOSPITAL_COMMUNITY): Payer: Medicare (Managed Care)

## 2020-01-17 ENCOUNTER — Encounter: Payer: Self-pay | Admitting: Pulmonary Disease

## 2020-01-17 ENCOUNTER — Other Ambulatory Visit: Payer: Self-pay

## 2020-01-17 ENCOUNTER — Ambulatory Visit (INDEPENDENT_AMBULATORY_CARE_PROVIDER_SITE_OTHER): Payer: Medicare (Managed Care) | Admitting: Pulmonary Disease

## 2020-01-17 ENCOUNTER — Ambulatory Visit (INDEPENDENT_AMBULATORY_CARE_PROVIDER_SITE_OTHER): Payer: Medicare (Managed Care)

## 2020-01-17 VITALS — BP 144/84 | HR 67 | Temp 97.3°F | Ht 59.0 in | Wt 195.8 lb

## 2020-01-17 DIAGNOSIS — R0602 Shortness of breath: Secondary | ICD-10-CM

## 2020-01-17 MED ORDER — BENZONATATE 200 MG PO CAPS: 200.0000 mg | ORAL_CAPSULE | Freq: Three times a day (TID) | ORAL | 1 refills | Status: DC | PRN

## 2020-01-17 NOTE — Progress Notes (Signed)
Jennifer [name]    [id number]    09-Jun-1934  Primary Care Physician:Fernald, Morey Hummingbird, NP  Referring Physician: Janifer Adie, [place],  [place] [address]  Chief complaint:    Follow-up obstructive sleep apnea She does have pulmonary hypertension She is coughing a lot   HPI:  Her coughing seems to be affecting a CPAP compliance she states She said she is feeling more short of breath Cannot bend over without feeling very short of breath With spasms of coughing, she will have some wheezing  Recently started a new CPAP-tolerating it well-has only had this for a few days She states she is not having any significant problems with CPAP  Otherwise, has been relatively stable She gets short of breath with activity  Previous history Patient recently moved here from [place], did speak with Dr. Gennette Pac regarding a CT Repeat CT significant for some atelectasis She has a history of moderate pulmonary hypertension-echo did confirm moderate pulmonary hypertension-2019  She states she uses nitro periodically for chest discomfort  Outpatient Encounter Medications as of 01/17/2020  Medication Sig  . acetaminophen (TYLENOL) 650 MG CR tablet Take 650 mg by mouth 2 (two) times a day.  . albuterol (VENTOLIN HFA) 108 (90 Base) MCG/ACT inhaler Inhale 2 puffs into the lungs every 6 (six) hours as needed for wheezing or shortness of breath.  Marland Kitchen aspirin EC 81 MG tablet Take 81 mg by mouth daily. CARDIAC EVENT PREVENTION  . Cholecalciferol (VITAMIN D3) 5000 units TABS Take 5,000 Units by mouth daily with breakfast.   . enalapril (VASOTEC) 20 MG tablet Take 20 mg by mouth daily.  Marland Kitchen escitalopram (LEXAPRO) 5 MG tablet Take 5 mg by mouth daily.  . furosemide (LASIX) 20 MG tablet Take 20 mg by mouth daily.  Marland Kitchen levothyroxine (SYNTHROID, LEVOTHROID) 50 MCG tablet Take 50 mcg by mouth daily before breakfast.  . mirtazapine (REMERON) 7.5 MG tablet Take 7.5 mg by mouth at bedtime.    . Multiple Vitamins-Minerals (PRESERVISION AREDS 2) CAPS Take 1 tablet by mouth daily.  . nitroGLYCERIN (NITROSTAT) 0.4 MG SL tablet Place 0.4 mg under the tongue every 5 (five) minutes as needed for chest pain.  Marland Kitchen oxyCODONE-acetaminophen (PERCOCET) 5-325 MG tablet Take 1 tablet by mouth every 4 (four) hours as needed for severe pain.  . pantoprazole (PROTONIX) 20 MG tablet Take 20 mg by mouth daily.  . pravastatin (PRAVACHOL) 80 MG tablet Take 80 mg by mouth every evening.  . ranolazine (RANEXA) 500 MG 12 hr tablet Take 500 mg by mouth 2 (two) times daily.  Marland Kitchen Spacer/Aero-Holding Chambers (AEROCHAMBER MV) inhaler Use as instructed  . traMADol (ULTRAM) 50 MG tablet Take 50 mg by mouth daily as needed for moderate pain.  Marland Kitchen amLODipine (NORVASC) 2.5 MG tablet Take 1 tablet (2.5 mg total) by mouth daily.   No facility-administered encounter medications on file as of 01/17/2020.    Allergies as of 01/17/2020  . (No Known Allergies)    Past Medical History:  Diagnosis Date  . Ambulates with cane    straight  . At risk for falls   . CAD (coronary artery disease)   . Cataracts, bilateral   . GERD (gastroesophageal reflux disease)   . Hypercholesterolemia   . Hypertension   . Hypothyroidism   . Major depression   . Musculoskeletal back pain    CHRONIC LEFT-SIDED DISCOMFORT  . Obstructive sleep apnea on CPAP    uses CPAP nightly  .  Osteopenia   . PVD (peripheral vascular disease) (HCC)   . Stable angina (HCC)   . SVD (spontaneous vaginal delivery)    x 2  . Vitamin D deficiency   . Wears glasses   . Wears partial dentures    upper    Past Surgical History:  Procedure Laterality Date  . ANKLE SURGERY Left   . BACK SURGERY    . CARDIAC CATHETERIZATION  06/2008   PCI to LAD with 3.0 x 12 mm DES  . COLONOSCOPY    . EYE SURGERY     remove cataracts - bilateral  . MULTIPLE TOOTH EXTRACTIONS     upper teeth - upper dentures  . OPEN REDUCTION INTERNAL FIXATION (ORIF) DISTAL RADIAL  FRACTURE Left 05/16/2019   Procedure: OPEN REDUCTION INTERNAL FIXATION (ORIF) DISTAL RADIAL FRACTURE;  Surgeon: Dairl Ponder, MD;  Location: [facility];  Service: Orthopedics;  Laterality: Left;  . SPINAL FUSION    . UPPER GI ENDOSCOPY      Family History  Problem Relation Age of Onset  . Heart disease Mother   . Asthma Father   . Heart disease Brother 55       CAD    Social History   Socioeconomic History  . Marital status: Widowed    Spouse name: Not on file  . Number of children: 2  . Years of education: Not on file  . Highest education level: Not on file  Occupational History  . Not on file  Tobacco Use  . Smoking status: Never Smoker  . Smokeless tobacco: Never Used  Substance and Sexual Activity  . Alcohol use: Never  . Drug use: Never  . Sexual activity: Not on file  Other Topics Concern  . Not on file  Social History Narrative  . Not on file   Social Determinants of Health   Financial Resource Strain:   . Difficulty of Paying Living Expenses: Not on file  Food Insecurity:   . Worried About Programme researcher, broadcasting/film/video in the Last Year: Not on file  . Ran Out of Food in the Last Year: Not on file  Transportation Needs:   . Lack of Transportation (Medical): Not on file  . Lack of Transportation (Non-Medical): Not on file  Physical Activity:   . Days of Exercise per Week: Not on file  . Minutes of Exercise per Session: Not on file  Stress:   . Feeling of Stress : Not on file  Social Connections:   . Frequency of Communication with Friends and Family: Not on file  . Frequency of Social Gatherings with Friends and Family: Not on file  . Attends Religious Services: Not on file  . Active Member of Clubs or Organizations: Not on file  . Attends Banker Meetings: Not on file  . Marital Status: Not on file  Intimate Partner Violence:   . Fear of Current or Ex-Partner: Not on file  . Emotionally Abused: Not on file  . Physically Abused: Not on file  .  Sexually Abused: Not on file    Review of Systems  Constitutional: Negative.   HENT: Negative.  Negative for congestion.   Eyes: Negative.   Respiratory: Positive for cough and shortness of breath.   Cardiovascular: Negative for chest pain.       Chest wall discomfort  Gastrointestinal: Negative.   Endocrine: Negative.   Musculoskeletal: Positive for arthralgias.  All other systems reviewed and are negative.   Vitals:   01/17/20 1028  BP: (!) 144/84  Pulse: 67  Temp: (!) 97.3 F (36.3 C)  SpO2: 97%     Physical Exam  Constitutional: She appears well-developed and well-nourished.  Overweight  HENT:  Head: Normocephalic and atraumatic.  Eyes: Pupils are equal, round, and reactive to light. Conjunctivae and EOM are normal. Right eye exhibits no discharge. Left eye exhibits no discharge.  Neck: No tracheal deviation present. No thyromegaly present.  Cardiovascular: Normal rate and regular rhythm.  Pulmonary/Chest: Effort normal and breath sounds normal. No respiratory distress. She has no wheezes. She has no rales. She exhibits no tenderness.  Musculoskeletal:        General: Edema present.     Cervical back: Normal range of motion and neck supple.  Neurological: She is alert.  Uses a walker  Skin: Skin is warm and dry.  Psychiatric: She has a normal mood and affect.   Data Reviewed: CT scan of the chest reviewed showing midlung atelectasis Echocardiogram with moderate pulmonary hypertension-results reviewed CPAP compliance 12/17/2019 to 01/15/2020 reveals 67% compliance Average use of 5 hours 16 minutes on days used Machine set at 5-15 Residual AHI 1.9  Assessment:  Obstructive sleep apnea -Encouraged to use CPAP regularly -Has no concerns or questions regarding CPAP use apart from a cough  -She feels her compliance will improve her coughing is better  Nocturnal desaturations -CPAP should help this -Encourage compliance  Pulmonary hypertension Multifactorial  pulmonary hypertension May be related to nocturnal hypoxemia -Encouraged to continue using oxygen at night -Encouraged to continue using CPAP I will repeat her echocardiogram to assess if there is any worsening of the pulmonary hypertension which may be contributing to her increasing shortness of breath  Shortness of breath with wheezing -Empiric albuterol-encouraged to continue this  Abnormal CT scan of the chest revealing atelectasis -Had a recent chest x-ray in January 2020-atelectasis  -We will follow up with a chest x-ray today  Plan/Recommendations:  Encouraged to continue using CPAP on a regular basis  Symptomatic treatment for cough-add benzonatate  Obtain chest x-ray today Encouraged to call with any significant concerns  We will see her back in the office in about 6 weeks We will get a chest x-ray today  Anticipate ordering CT scan about March to follow-up on atelectasis   Virl Diamond MD  Pulmonary and Critical Care 01/17/2020, 10:47 AM  CC: Jethro Bastos, MD

## 2020-01-17 NOTE — Patient Instructions (Signed)
Cough, increasing shortness of breath  We will give you a course of benzonatate 200 p.o. 3 times daily as needed  Obtain a chest x-ray today  Echocardiogram to assess pulmonary hypertension  We will follow-up in 6 weeks

## 2020-01-18 ENCOUNTER — Other Ambulatory Visit (HOSPITAL_COMMUNITY): Payer: Medicare (Managed Care)

## 2020-01-18 ENCOUNTER — Encounter (HOSPITAL_COMMUNITY): Payer: Self-pay

## 2020-01-20 ENCOUNTER — Other Ambulatory Visit: Payer: Self-pay

## 2020-01-20 ENCOUNTER — Ambulatory Visit (HOSPITAL_COMMUNITY)
Admission: RE | Admit: 2020-01-20 | Discharge: 2020-01-20 | Disposition: A | Discharge status: Home / Self Care | Payer: Medicare (Managed Care) | Source: Ambulatory Visit | Attending: Gerontology | Admitting: Gerontology

## 2020-01-20 DIAGNOSIS — N83201 Unspecified ovarian cyst, right side: Secondary | ICD-10-CM | POA: Insufficient documentation

## 2020-01-23 ENCOUNTER — Other Ambulatory Visit (HOSPITAL_COMMUNITY): Payer: Medicare (Managed Care)

## 2020-02-01 ENCOUNTER — Other Ambulatory Visit (HOSPITAL_COMMUNITY): Payer: Medicare (Managed Care)

## 2020-02-06 ENCOUNTER — Ambulatory Visit: Payer: Self-pay | Admitting: Surgery

## 2020-02-15 ENCOUNTER — Other Ambulatory Visit (HOSPITAL_COMMUNITY): Payer: Medicare (Managed Care)

## 2020-02-23 ENCOUNTER — Other Ambulatory Visit: Payer: Self-pay

## 2020-02-23 ENCOUNTER — Ambulatory Visit (HOSPITAL_COMMUNITY): Payer: Medicare (Managed Care) | Attending: Cardiovascular Disease

## 2020-02-23 DIAGNOSIS — R0602 Shortness of breath: Secondary | ICD-10-CM | POA: Diagnosis present

## 2020-02-28 ENCOUNTER — Ambulatory Visit (INDEPENDENT_AMBULATORY_CARE_PROVIDER_SITE_OTHER): Payer: Medicare (Managed Care) | Admitting: Pulmonary Disease

## 2020-02-28 ENCOUNTER — Other Ambulatory Visit: Payer: Self-pay

## 2020-02-28 ENCOUNTER — Encounter: Payer: Self-pay | Admitting: Pulmonary Disease

## 2020-02-28 DIAGNOSIS — J9811 Atelectasis: Secondary | ICD-10-CM

## 2020-02-28 MED ORDER — BENZONATATE 200 MG PO CAPS: 200.0000 mg | ORAL_CAPSULE | Freq: Three times a day (TID) | ORAL | 5 refills | Status: DC | PRN

## 2020-02-28 NOTE — Progress Notes (Signed)
Jennifer [name]    [id number]    1934-03-23  Primary Care Physician:Fernald, Lyla Son, NP  Referring Physician: Izetta Dakin, NP [address] [place],  [place] [address]  Chief complaint:    Follow-up obstructive sleep apnea She does have pulmonary hypertension Complaining of a cough  HPI: Continues to cough Denies any chest pains or chest discomfort Shortness of breath with activity Exercise tolerance only about a block Ambulates with a walker Occasional wheezing Uses oxygen periodically  Recently started a new CPAP-tolerating it well-has only had this for a few days She states she is not having any significant problems with CPAP  Otherwise, has been relatively stable She gets short of breath with activity  Previous history Patient recently moved here from [place], did speak with Dr. Avie Arenas regarding a CT Repeat CT significant for some atelectasis She has a history of moderate pulmonary hypertension-echo did confirm moderate pulmonary hypertension-2019  She states she uses nitro periodically for chest discomfort  Outpatient Encounter Medications as of 02/28/2020  Medication Sig  . acetaminophen (TYLENOL) 650 MG CR tablet Take 650 mg by mouth 2 (two) times a day.  . albuterol (VENTOLIN HFA) 108 (90 Base) MCG/ACT inhaler Inhale 2 puffs into the lungs every 6 (six) hours as needed for wheezing or shortness of breath.  Marland Kitchen amLODipine (NORVASC) 2.5 MG tablet Take 1 tablet (2.5 mg total) by mouth daily.  Marland Kitchen aspirin EC 81 MG tablet Take 81 mg by mouth daily. CARDIAC EVENT PREVENTION  . benzonatate (TESSALON) 200 MG capsule Take 1 capsule (200 mg total) by mouth 3 (three) times daily as needed for cough.  . Cholecalciferol (VITAMIN D3) 5000 units TABS Take 5,000 Units by mouth daily with breakfast.   . enalapril (VASOTEC) 20 MG tablet Take 20 mg by mouth daily.  Marland Kitchen escitalopram (LEXAPRO) 5 MG tablet Take 5 mg by mouth daily.  . furosemide (LASIX) 20 MG tablet Take  20 mg by mouth daily.  Marland Kitchen levothyroxine (SYNTHROID, LEVOTHROID) 50 MCG tablet Take 50 mcg by mouth daily before breakfast.  . mirtazapine (REMERON) 7.5 MG tablet Take 7.5 mg by mouth at bedtime.  . Multiple Vitamins-Minerals (PRESERVISION AREDS 2) CAPS Take 1 tablet by mouth daily.  . nitroGLYCERIN (NITROSTAT) 0.4 MG SL tablet Place 0.4 mg under the tongue every 5 (five) minutes as needed for chest pain.  . pantoprazole (PROTONIX) 20 MG tablet Take 20 mg by mouth daily.  . pravastatin (PRAVACHOL) 80 MG tablet Take 80 mg by mouth every evening.  . ranolazine (RANEXA) 500 MG 12 hr tablet Take 500 mg by mouth 2 (two) times daily.  Marland Kitchen Spacer/Aero-Holding Chambers (AEROCHAMBER MV) inhaler Use as instructed  . traMADol (ULTRAM) 50 MG tablet Take 50 mg by mouth daily as needed for moderate pain.  . [DISCONTINUED] benzonatate (TESSALON) 200 MG capsule Take 1 capsule (200 mg total) by mouth 3 (three) times daily as needed for cough.  . [DISCONTINUED] oxyCODONE-acetaminophen (PERCOCET) 5-325 MG tablet Take 1 tablet by mouth every 4 (four) hours as needed for severe pain.   No facility-administered encounter medications on file as of 02/28/2020.    Allergies as of 02/28/2020  . (No Known Allergies)    Past Medical History:  Diagnosis Date  . Ambulates with cane    straight  . At risk for falls   . CAD (coronary artery disease)   . Cataracts, bilateral   . GERD (gastroesophageal reflux disease)   . Hypercholesterolemia   .  Hypertension   . Hypothyroidism   . Major depression   . Musculoskeletal back pain    CHRONIC LEFT-SIDED DISCOMFORT  . Obstructive sleep apnea on CPAP    uses CPAP nightly  . Osteopenia   . PVD (peripheral vascular disease) ([facility])   . Stable angina (HCC)   . SVD (spontaneous vaginal delivery)    x 2  . Vitamin D deficiency   . Wears glasses   . Wears partial dentures    upper    Past Surgical History:  Procedure Laterality Date  . ANKLE SURGERY Left   . BACK  SURGERY    . CARDIAC CATHETERIZATION  06/2008   PCI to LAD with 3.0 x 12 mm DES  . COLONOSCOPY    . EYE SURGERY     remove cataracts - bilateral  . MULTIPLE TOOTH EXTRACTIONS     upper teeth - upper dentures  . OPEN REDUCTION INTERNAL FIXATION (ORIF) DISTAL RADIAL FRACTURE Left 05/16/2019   Procedure: OPEN REDUCTION INTERNAL FIXATION (ORIF) DISTAL RADIAL FRACTURE;  Surgeon: Charlotte Crumb, MD;  Location: [facility];  Service: Orthopedics;  Laterality: Left;  . SPINAL FUSION    . UPPER GI ENDOSCOPY      Family History  Problem Relation Age of Onset  . Heart disease Mother   . Asthma Father   . Heart disease Brother 32       CAD    Social History   Socioeconomic History  . Marital status: Widowed    Spouse name: Not on file  . Number of children: 2  . Years of education: Not on file  . Highest education level: Not on file  Occupational History  . Not on file  Tobacco Use  . Smoking status: Never Smoker  . Smokeless tobacco: Never Used  Substance and Sexual Activity  . Alcohol use: Never  . Drug use: Never  . Sexual activity: Not on file  Other Topics Concern  . Not on file  Social History Narrative  . Not on file   Social Determinants of Health   Financial Resource Strain:   . Difficulty of Paying Living Expenses:   Food Insecurity:   . Worried About Charity fundraiser in the Last Year:   . Arboriculturist in the Last Year:   Transportation Needs:   . Film/video editor (Medical):   Marland Kitchen Lack of Transportation (Non-Medical):   Physical Activity:   . Days of Exercise per Week:   . Minutes of Exercise per Session:   Stress:   . Feeling of Stress :   Social Connections:   . Frequency of Communication with Friends and Family:   . Frequency of Social Gatherings with Friends and Family:   . Attends Religious Services:   . Active Member of Clubs or Organizations:   . Attends Archivist Meetings:   Marland Kitchen Marital Status:   Intimate Partner Violence:   .  Fear of Current or Ex-Partner:   . Emotionally Abused:   Marland Kitchen Physically Abused:   . Sexually Abused:     Review of Systems  Constitutional: Negative.   HENT: Negative.  Negative for congestion.   Eyes: Negative.   Respiratory: Positive for cough and shortness of breath.   Cardiovascular: Negative for chest pain.       Chest wall discomfort  Gastrointestinal: Negative.   Endocrine: Negative.   Musculoskeletal: Positive for arthralgias.  All other systems reviewed and are negative.   Vitals:   02/28/20  1023  BP: (!) 142/82  Pulse: 75  Temp: (!) 97 F (36.1 C)  SpO2: 95%     Physical Exam  Constitutional: She appears well-developed and well-nourished.  Overweight  HENT:  Head: Normocephalic and atraumatic.  Eyes: Pupils are equal, round, and reactive to light. Conjunctivae and EOM are normal. Right eye exhibits no discharge. Left eye exhibits no discharge.  Neck: No tracheal deviation present. No thyromegaly present.  Cardiovascular: Normal rate and regular rhythm.  Pulmonary/Chest: Effort normal and breath sounds normal. No respiratory distress. She has no wheezes. She has no rales. She exhibits no tenderness.  Musculoskeletal:     Cervical back: Normal range of motion and neck supple.  Neurological:  Uses a walker  Skin: Skin is warm and dry.  Psychiatric: She has a normal mood and affect.   Data Reviewed: CT scan of the chest reviewed showing midlung atelectasis Echocardiogram with moderate pulmonary hypertension-results reviewed   Oxygen level 95% at rest Ambulated as tolerated-did not desaturate  CPAP compliance shows 93% compliance-2/20-3/21  average use of 5 hours 47 minutes 95 percentile pressure of 10.7 AHI 1.8  Echocardiogram- -severe pulmonary hypertension -Likely related to obstructive sleep apnea  Assessment:  Obstructive sleep apnea -Encouraged to continue CPAP on a regular basis -Call with any significant concerns  Nocturnal  desaturations -Continue CPAP use  Patient did not desaturate with activity -Does not need oxygen supplementation  Pulmonary hypertension Multifactorial pulmonary hypertension May be related to nocturnal hypoxemia -Encouraged to continue using CPAP  Shortness of breath with wheezing -Empiric albuterol-encouraged to continue this  Abnormal CT scan of the chest revealing atelectasis -We will repeat CT scan of the chest  Plan/Recommendations:  Encouraged to continue using CPAP on a regular basis  Symptomatic treatment for cough-add benzonatate-refill benzonatate today  Encouraged to call with any significant concerns  We will see her back in the office in about 2 months  CT scan will be ordered  Did not desaturate during ambulation-does not require oxygen at rest or with ambulation   Virl Diamond MD [facility] 02/28/2020, 11:31 AM  CC: Izetta Dakin, NP

## 2020-02-28 NOTE — Patient Instructions (Signed)
we will check your oxygen level  Atelectasis -repeat CT scan of the chest for atelectasis of the lung  Inquire from your medical supply company about oxygen use and supplemental oxygen-portable oxygen for travel  Refill benzonatate  We will see you in 2 months

## 2020-03-15 ENCOUNTER — Ambulatory Visit
Admission: RE | Admit: 2020-03-15 | Discharge: 2020-03-15 | Disposition: A | Discharge status: Home / Self Care | Payer: Medicare (Managed Care) | Source: Ambulatory Visit | Attending: Pulmonary Disease | Admitting: Pulmonary Disease

## 2020-03-15 ENCOUNTER — Other Ambulatory Visit: Payer: Self-pay

## 2020-03-15 DIAGNOSIS — J9811 Atelectasis: Secondary | ICD-10-CM

## 2020-06-18 ENCOUNTER — Telehealth: Payer: Medicare (Managed Care) | Admitting: Cardiology

## 2020-06-18 ENCOUNTER — Telehealth: Payer: Self-pay

## 2020-06-18 NOTE — Telephone Encounter (Signed)
Called pt in regards to virtual appointment scheduled for today 7/12 at 9:20 am. Pt voiced she has another pt scheduled today and need to reschedule for a later date. Nurse will send message to schedulers to reschedule.

## 2020-06-21 ENCOUNTER — Telehealth: Payer: Self-pay | Admitting: *Deleted

## 2020-06-21 NOTE — Telephone Encounter (Signed)
A detailed message was left, re: her follow up visit. 

## 2020-08-10 ENCOUNTER — Ambulatory Visit (INDEPENDENT_AMBULATORY_CARE_PROVIDER_SITE_OTHER): Payer: Medicare (Managed Care) | Admitting: Cardiology

## 2020-08-10 ENCOUNTER — Other Ambulatory Visit: Payer: Self-pay

## 2020-08-10 ENCOUNTER — Encounter: Payer: Self-pay | Admitting: Cardiology

## 2020-08-10 VITALS — BP 132/76 | HR 74 | Temp 97.2°F | Ht 59.0 in | Wt 210.0 lb

## 2020-08-10 DIAGNOSIS — R0602 Shortness of breath: Secondary | ICD-10-CM | POA: Diagnosis not present

## 2020-08-10 DIAGNOSIS — I272 Pulmonary hypertension, unspecified: Secondary | ICD-10-CM

## 2020-08-10 DIAGNOSIS — I10 Essential (primary) hypertension: Secondary | ICD-10-CM | POA: Diagnosis not present

## 2020-08-10 DIAGNOSIS — I2510 Atherosclerotic heart disease of native coronary artery without angina pectoris: Secondary | ICD-10-CM

## 2020-08-10 DIAGNOSIS — R635 Abnormal weight gain: Secondary | ICD-10-CM

## 2020-08-10 MED ORDER — AMLODIPINE BESYLATE 2.5 MG PO TABS: 2.5000 mg | ORAL_TABLET | Freq: Every day | ORAL | 3 refills | Status: DC

## 2020-08-10 MED ORDER — FUROSEMIDE 20 MG PO TABS: ORAL_TABLET | ORAL | 0 refills | Status: DC

## 2020-08-10 NOTE — Progress Notes (Signed)
Cardiology Office Note:    Date:  08/10/2020   ID:  Jennifer [name], DOB 1934/05/04, MRN [id number]  PCP:  Linda Hedges, NP  Cardiologist:  Buford Dresser, MD PhD  Referring MD: Linda Hedges, NP   CC: follow up  History of Present Illness:    Jennifer [name] is a 86 y.o. female with a hx of CAD with stable angina, HTN, HLD, sleep apnea, GERD with hiatal hernia/esophageal stricture who is seen for follow up today. I initially met her 01/02/20 as a new patient to me/transfer from Dr. Saunders Revel at the request of Linda Hedges, NP for the evaluation and management of CAD.  Cardiac history: PCI to LAD in 2009 in [place]. Had repeat cath in 2014 due to accelerating angina, stent patent, jailed diagonal, no intervention. Nuc stress 2018 without ischemic, echo 2018 with normal LV function and RVSP 26 mmHg. She established with Dr. Saunders Revel in 05/2018 after moving from [place]. Nuclear stress done here in 06/2018 showed breast attenuation but no ischemia. Echo 06/2018 showed normal LV function with pHtn (PASP 55). She had her ranexa dose decreased due to dizziness 08/2018. She had palpitations in 09/2018 and had 30 day event monitor ordered, which showed no significant arrhythmias.   Today: Has shortness of breath bending over, walking about a block. No chest pain. Feels like her whole body is swollen. Feels that her normal weight is 175 lbs, she is currently 210 lbs. Has known severe pulmonary hypertension, last echo 02/2020 with normal EF and grade 2 DD. Feels better if she uses CPAP/O2 during the day.  Uses nitro very rarely. Makes excellent urine on 20 mg dose of furosemide, in the bathroom for most of the day.  We discussed monitoring salt, fluid and daily weights today.  Denies chest pain, PND, orthopnea. No syncope or palpitations.  Past Medical History:  Diagnosis Date  . Ambulates with cane    straight  . At risk for falls   . CAD (coronary artery disease)   . Cataracts, bilateral   .  GERD (gastroesophageal reflux disease)   . Hypercholesterolemia   . Hypertension   . Hypothyroidism   . Major depression   . Musculoskeletal back pain    CHRONIC LEFT-SIDED DISCOMFORT  . Obstructive sleep apnea on CPAP    uses CPAP nightly  . Osteopenia   . PVD (peripheral vascular disease) ([place])   . Stable angina ([facility])   . SVD (spontaneous vaginal delivery)    x 2  . Vitamin D deficiency   . Wears glasses   . Wears partial dentures    upper    Past Surgical History:  Procedure Laterality Date  . ANKLE SURGERY Left   . BACK SURGERY    . CARDIAC CATHETERIZATION  06/2008   PCI to LAD with 3.0 x 12 mm DES  . COLONOSCOPY    . EYE SURGERY     remove cataracts - bilateral  . MULTIPLE TOOTH EXTRACTIONS     upper teeth - upper dentures  . OPEN REDUCTION INTERNAL FIXATION (ORIF) DISTAL RADIAL FRACTURE Left 05/16/2019   Procedure: OPEN REDUCTION INTERNAL FIXATION (ORIF) DISTAL RADIAL FRACTURE;  Surgeon: Charlotte Crumb, MD;  Location: [facility];  Service: Orthopedics;  Laterality: Left;  . SPINAL FUSION    . UPPER GI ENDOSCOPY      Current Medications: Current Outpatient Medications on File Prior to Visit  Medication Sig  . acetaminophen (TYLENOL) 650 MG CR tablet Take 650 mg by mouth 2 (two) times a day.  Marland Kitchen  albuterol (VENTOLIN HFA) 108 (90 Base) MCG/ACT inhaler Inhale 2 puffs into the lungs every 6 (six) hours as needed for wheezing or shortness of breath.  Marland Kitchen aspirin EC 81 MG tablet Take 81 mg by mouth daily. CARDIAC EVENT PREVENTION  . benzonatate (TESSALON) 200 MG capsule Take 1 capsule (200 mg total) by mouth 3 (three) times daily as needed for cough.  . Cholecalciferol (VITAMIN D3) 5000 units TABS Take 5,000 Units by mouth daily with breakfast.   . enalapril (VASOTEC) 20 MG tablet Take 20 mg by mouth daily.  Marland Kitchen escitalopram (LEXAPRO) 5 MG tablet Take 5 mg by mouth daily.  . furosemide (LASIX) 20 MG tablet Take 20 mg by mouth daily.  Marland Kitchen levothyroxine (SYNTHROID, LEVOTHROID) 50 MCG  tablet Take 50 mcg by mouth daily before breakfast.  . mirtazapine (REMERON) 7.5 MG tablet Take 7.5 mg by mouth at bedtime.  . Multiple Vitamins-Minerals (PRESERVISION AREDS 2) CAPS Take 1 tablet by mouth daily.  . nitroGLYCERIN (NITROSTAT) 0.4 MG SL tablet Place 0.4 mg under the tongue every 5 (five) minutes as needed for chest pain.  . pantoprazole (PROTONIX) 20 MG tablet Take 20 mg by mouth daily.  . pravastatin (PRAVACHOL) 80 MG tablet Take 80 mg by mouth every evening.  . ranolazine (RANEXA) 500 MG 12 hr tablet Take 500 mg by mouth 2 (two) times daily.  Marland Kitchen Spacer/Aero-Holding Chambers (AEROCHAMBER MV) inhaler Use as instructed  . traMADol (ULTRAM) 50 MG tablet Take 50 mg by mouth daily as needed for moderate pain.  Marland Kitchen amLODipine (NORVASC) 2.5 MG tablet Take 1 tablet (2.5 mg total) by mouth daily.   No current facility-administered medications on file prior to visit.     Allergies:   Patient has no known allergies.   Social History   Tobacco Use  . Smoking status: Never Smoker  . Smokeless tobacco: Never Used  Vaping Use  . Vaping Use: Never used  Substance Use Topics  . Alcohol use: Never  . Drug use: Never    Family History: The patient's family history includes Asthma in her father; Heart disease in her mother; Heart disease (age of onset: 18) in her brother.  ROS:   Please see the history of present illness.  Additional pertinent ROS negative except as documented.   EKGs/Labs/Other Studies Reviewed:    The following studies were reviewed today: Monitor 10/21/18  The patient was enrolled for 30 days. 60% of the monitoring period yielded diagnostic tracings.  The predominant rhythm was sinus with an average rate of 72 bpm (range 51 to 125 bpm).  Isolated PACs and PVCs were noted.  No sustained arrhythmia or prolonged pause was identified.   Predominantly sinus rhythm with isolated PACs and PVCs.  No sustained arrhythmia.  lexiscan 7.11.19  The left ventricular  ejection fraction is hyperdynamic (>65%).  Nuclear stress EF: 71%.  There was no ST segment deviation noted during stress.  There is a small defect of mild severity present in the basal anteroseptal, mid anteroseptal and apical septal location. The defect is non-reversible that is consistent with breast attenuation artifact. No ischemia noted.  This is a low risk study.   Echo 06/17/18 - Left ventricle: The cavity size was normal. Wall thickness was   normal. Systolic function was normal. The estimated ejection   fraction was in the range of 60% to 65%. Wall motion was normal;   there were no regional wall motion abnormalities. Left   ventricular diastolic function parameters were normal. - Mitral  valve: Calcified annulus. There was mild regurgitation. - Pulmonary arteries: Systolic pressure was moderately increased.   PA peak pressure: 55 mm Hg (S).  Impressions: - Normal LV systolic function; mild MR; trace TR with moderate   pulmonary hypertension.  EKG:  EKG is personally reviewed.  The ekg ordered today demonstrates normal sinus rhythm at 74 bpm  Recent Labs: 09/02/2019: ALT 8; BUN 17; Creatinine, Ser 1.07; NT-Pro BNP 133; Potassium 4.8; Sodium 142  Recent Lipid Panel    Component Value Date/Time   CHOL 127 09/02/2019 0903   TRIG 57 09/02/2019 0903   HDL 77 09/02/2019 0903   CHOLHDL 1.6 09/02/2019 0903   LDLCALC 37 09/02/2019 0903    Physical Exam:    VS:  BP 132/76   Pulse 74   Temp (!) 97.2 F (36.2 C)   Ht $R'4\' 11"'UL$  (1.499 m)   Wt 210 lb (95.3 kg)   LMP  (LMP Unknown)   SpO2 93%   BMI 42.41 kg/m     Wt Readings from Last 3 Encounters:  08/10/20 210 lb (95.3 kg)  02/28/20 197 lb 6.4 oz (89.5 kg)  01/17/20 195 lb 12.8 oz (88.8 kg)    GEN: Well nourished, well developed in no acute distress HEENT: Normal, moist mucous membranes NECK: JVD low neck at 90 degrees CARDIAC: regular rhythm, normal S1 and S2, no rubs or gallops. No murmur. VASCULAR: Radial and  DP pulses 2+ bilaterally. No carotid bruits RESPIRATORY:  Clear with bilateral rales and prolonged expiratory phase ABDOMEN: Soft, non-tender, non-distended MUSCULOSKELETAL:  Ambulates independently with walker SKIN: Warm and dry. Bilateral LE edema, dependent distribution to thigh, without significant pitting NEUROLOGIC:  Alert and oriented x 3. No focal neuro deficits noted. PSYCHIATRIC:  Normal affect   ASSESSMENT:    1. Shortness of breath   2. Essential hypertension   3. Weight gain   4. Pulmonary hypertension, unspecified ([place])   5. Coronary artery disease involving native coronary artery of native heart without angina pectoris    PLAN:    Dyspnea, weight gain: -has rales, JVD, and nonpitting edema -we will increase her dose of lasix (given schedule) and follow up closely -daily weights, salt and fluid restriction discussed -checking BNP, BMP -ECG sinus -follows with pulmonology for sleep apnea, pulmonary hypertension  CAD: denies angina at this time. Tolerating medications well -continue aspirin 81 mg, pravastatin 80 mg, ranolazine 500 mg BID -has SL NG -counseled on red flag warning signs requiring immediate medical attention -recent echo, stress test in 2019 -has PRN SL NG, has not required -if symptoms not improved with lasix, may need to consider stress test  Hypertension: near goal today, goal <130/80 -continue enalapril, furosemide, low dose amlodipine -had issues with beta blocker in the past  Secondary prevention -recommend heart healthy/Mediterranean diet, with whole grains, fruits, vegetable, fish, lean meats, nuts, and olive oil. Limit salt. -recommend avoidance of tobacco products. Avoid excess alcohol. -Additional risk factor control:  -Hyperlipidemia: LDL goal <70. On atorvastatin  -Blood pressure control: as above  -Weight: BMI 39, counseled on diet. Limited ability for exercise given mobility, encouraged to safely do what she can.  Plan for follow  up: 1 mos or sooner PRN  Copy of note will be sent to: [facility] [place], [place] [address] Fax [phone]  Medication Adjustments/Labs and Tests Ordered: Current medicines are reviewed at length with the patient today.  Concerns regarding medicines are outlined above.  Orders Placed This Encounter  Procedures  . Brain natriuretic peptide  . Basic metabolic panel  . EKG 12-Lead   Meds ordered this encounter  Medications  . furosemide (LASIX) 20 MG tablet    Sig: Take 1 tablet (20 mg total) by mouth 2 (two) times daily for 7 days, THEN 1 tablet (20 mg total) daily.    Dispense:  97 tablet    Refill:  0  . amLODipine (NORVASC) 2.5 MG tablet    Sig: Take 1 tablet (2.5 mg total) by mouth daily.    Dispense:  90 tablet    Refill:  3    Patient Instructions  Medication Instructions:  Take furosemide (lasix) 20 mg twice a day, once in the morning and once in the early afternoon, for one week. Then return to once daily dose. If the swelling returns, please call the office and we may keep the twice daily dosing.  *If you need a refill on your cardiac medications before your next appointment, please call your pharmacy*   Lab Work: Your physician recommends that you return for lab work today ( BNP, BMP)  If you have labs (blood work) drawn today and your tests are completely normal, you will receive your results only by: Marland Kitchen MyChart Message (if you have MyChart) OR . A paper copy in the mail If you have any lab test that is abnormal or we need to change your treatment, we will call you to review the results.   Testing/Procedures: None   Follow-Up: At [facility], you and your health needs are our priority.  As part of our continuing mission to provide you with exceptional heart care, we have created designated Provider Care Teams.  These Care Teams include your primary Cardiologist (physician) and Advanced Practice Providers (APPs -  Physician  Assistants and Nurse Practitioners) who all work together to provide you with the care you need, when you need it.  We recommend signing up for the patient portal called "MyChart".  Sign up information is provided on this After Visit Summary.  MyChart is used to connect with patients for Virtual Visits (Telemedicine).  Patients are able to view lab/test results, encounter notes, upcoming appointments, etc.  Non-urgent messages can be sent to your provider as well.   To learn more about what you can do with MyChart, go to [email or web address].    Your next appointment:   1 month(s)  The format for your next appointment:   In Person  Provider:   Buford Dresser, MD       Signed, Buford Dresser, MD PhD 08/10/2020   [place]

## 2020-08-10 NOTE — Patient Instructions (Addendum)
Medication Instructions:  Take furosemide (lasix) 20 mg twice a day, once in the morning and once in the early afternoon, for one week. Then return to once daily dose. If the swelling returns, please call the office and we may keep the twice daily dosing.  *If you need a refill on your cardiac medications before your next appointment, please call your pharmacy*   Lab Work: Your physician recommends that you return for lab work today ( BNP, BMP)  If you have labs (blood work) drawn today and your tests are completely normal, you will receive your results only by: Marland Kitchen MyChart Message (if you have MyChart) OR . A paper copy in the mail If you have any lab test that is abnormal or we need to change your treatment, we will call you to review the results.   Testing/Procedures: None   Follow-Up: At [facility], you and your health needs are our priority.  As part of our continuing mission to provide you with exceptional heart care, we have created designated Provider Care Teams.  These Care Teams include your primary Cardiologist (physician) and Advanced Practice Providers (APPs -  Physician Assistants and Nurse Practitioners) who all work together to provide you with the care you need, when you need it.  We recommend signing up for the patient portal called "MyChart".  Sign up information is provided on this After Visit Summary.  MyChart is used to connect with patients for Virtual Visits (Telemedicine).  Patients are able to view lab/test results, encounter notes, upcoming appointments, etc.  Non-urgent messages can be sent to your provider as well.   To learn more about what you can do with MyChart, go to [email or web address].    Your next appointment:   1 month(s)  The format for your next appointment:   In Person  Provider:   Jodelle Red, MD

## 2020-08-11 LAB — BASIC METABOLIC PANEL
BUN/Creatinine Ratio: 21 (ref 12–28)
BUN: 25 mg/dL (ref 8–27)
CO2: 28 mmol/L (ref 20–29)
Calcium: 9.3 mg/dL (ref 8.7–10.3)
Chloride: 99 mmol/L (ref 96–106)
Creatinine, Ser: 1.19 mg/dL — ABNORMAL HIGH (ref 0.57–1.00)
GFR calc Af Amer: 48 mL/min/{1.73_m2} — ABNORMAL LOW (ref >59)
GFR calc non Af Amer: 42 mL/min/{1.73_m2} — ABNORMAL LOW (ref >59)
Glucose: 89 mg/dL (ref 65–99)
Potassium: 5.5 mmol/L — ABNORMAL HIGH (ref 3.5–5.2)
Sodium: 140 mmol/L (ref 134–144)

## 2020-08-11 LAB — BRAIN NATRIURETIC PEPTIDE: BNP: 43.2 pg/mL (ref 0.0–100.0)

## 2020-08-17 ENCOUNTER — Ambulatory Visit (INDEPENDENT_AMBULATORY_CARE_PROVIDER_SITE_OTHER): Payer: Medicare (Managed Care) | Admitting: Pulmonary Disease

## 2020-08-17 ENCOUNTER — Encounter: Payer: Self-pay | Admitting: Pulmonary Disease

## 2020-08-17 ENCOUNTER — Other Ambulatory Visit: Payer: Self-pay

## 2020-08-17 VITALS — BP 118/76 | HR 71 | Temp 97.8°F | Ht 59.0 in | Wt 210.8 lb

## 2020-08-17 DIAGNOSIS — I2729 Other secondary pulmonary hypertension: Secondary | ICD-10-CM | POA: Diagnosis not present

## 2020-08-17 DIAGNOSIS — J9611 Chronic respiratory failure with hypoxia: Secondary | ICD-10-CM

## 2020-08-17 MED ORDER — HYDROCODONE-HOMATROPINE 5-1.5 MG/5ML PO SYRP: 5.0000 mL | ORAL_SOLUTION | Freq: Four times a day (QID) | ORAL | 0 refills | Status: DC | PRN

## 2020-08-17 NOTE — Patient Instructions (Signed)
Continue CPAP use -CPAP is working well -Very good compliance  Severe pulmonary hypertension -Referral to pulmonary hypertension clinic, to see Dr. Milas Kocher -Pulmonary hypertension is multifactorial due to diastolic heart failure,   Chronic cough -Prescription for Hycodan  Exercise desaturations -Oxygen supplementation at 2 L with activity  Follow-up in the office in 3 months

## 2020-08-17 NOTE — Progress Notes (Signed)
Jennifer [name]    [id number]    03-05-1934  Primary Care Physician:Fernald, Lyla Son, NP  Referring Physician: Izetta Dakin, NP [address] [place],  [place] [address]  Chief complaint:    Follow-up obstructive sleep apnea She does have pulmonary hypertension Complaining of a cough, shortness of breath  HPI: Continues to complain of a significant cough -Was at night -Likely related to large hiatal hernia -Sleeps with the head of the bed elevated  Denies any chest pains or chest discomfort Shortness of breath with activity -We did walk her today in the office and she did desaturate with exertion exercise -This is likely multifactorial secondary to musculoskeletal dysfunction and also pulmonary hypertension  Exercise tolerance only about a block Ambulates with a walker Occasional wheezing Uses oxygen periodically  Has been using CPAP on a regular basis with no significant problems tolerating CPAP  Otherwise, has been relatively stable She gets short of breath with activity  Previous history Patient recently moved here from [place], did speak with Dr. Avie Arenas regarding a CT Repeat CT significant for some atelectasis She has a history of moderate pulmonary hypertension-echo did confirm moderate pulmonary hypertension-2019   Outpatient Encounter Medications as of 08/17/2020  Medication Sig  . acetaminophen (TYLENOL) 650 MG CR tablet Take 650 mg by mouth 2 (two) times a day.  . albuterol (VENTOLIN HFA) 108 (90 Base) MCG/ACT inhaler Inhale 2 puffs into the lungs every 6 (six) hours as needed for wheezing or shortness of breath.  Marland Kitchen amLODipine (NORVASC) 2.5 MG tablet Take 1 tablet (2.5 mg total) by mouth daily.  Marland Kitchen aspirin EC 81 MG tablet Take 81 mg by mouth daily. CARDIAC EVENT PREVENTION  . benzonatate (TESSALON) 200 MG capsule Take 1 capsule (200 mg total) by mouth 3 (three) times daily as needed for cough.  . Cholecalciferol (VITAMIN D3) 5000 units TABS Take  5,000 Units by mouth daily with breakfast.   . enalapril (VASOTEC) 20 MG tablet Take 20 mg by mouth daily.  Marland Kitchen escitalopram (LEXAPRO) 5 MG tablet Take 5 mg by mouth daily.  . furosemide (LASIX) 20 MG tablet Take 1 tablet (20 mg total) by mouth 2 (two) times daily for 7 days, THEN 1 tablet (20 mg total) daily.  Marland Kitchen levothyroxine (SYNTHROID, LEVOTHROID) 50 MCG tablet Take 50 mcg by mouth daily before breakfast.  . mirtazapine (REMERON) 7.5 MG tablet Take 7.5 mg by mouth at bedtime.  . Multiple Vitamins-Minerals (PRESERVISION AREDS 2) CAPS Take 1 tablet by mouth daily.  . nitroGLYCERIN (NITROSTAT) 0.4 MG SL tablet Place 0.4 mg under the tongue every 5 (five) minutes as needed for chest pain.  . pantoprazole (PROTONIX) 20 MG tablet Take 20 mg by mouth daily.  . pravastatin (PRAVACHOL) 80 MG tablet Take 80 mg by mouth every evening.  . ranolazine (RANEXA) 500 MG 12 hr tablet Take 500 mg by mouth 2 (two) times daily.  Marland Kitchen Spacer/Aero-Holding Chambers (AEROCHAMBER MV) inhaler Use as instructed  . traMADol (ULTRAM) 50 MG tablet Take 50 mg by mouth daily as needed for moderate pain.  Marland Kitchen HYDROcodone-homatropine (HYCODAN) 5-1.5 MG/5ML syrup Take 5 mLs by mouth every 6 (six) hours as needed for cough.   No facility-administered encounter medications on file as of 08/17/2020.    Allergies as of 08/17/2020  . (No Known Allergies)    Past Medical History:  Diagnosis Date  . Ambulates with cane    straight  . At risk for falls   .  CAD (coronary artery disease)   . Cataracts, bilateral   . GERD (gastroesophageal reflux disease)   . Hypercholesterolemia   . Hypertension   . Hypothyroidism   . Major depression   . Musculoskeletal back pain    CHRONIC LEFT-SIDED DISCOMFORT  . Obstructive sleep apnea on CPAP    uses CPAP nightly  . Osteopenia   . PVD (peripheral vascular disease) ([facility])   . Stable angina ([facility])   . SVD (spontaneous vaginal delivery)    x 2  . Vitamin D deficiency   . Wears glasses   .  Wears partial dentures    upper    Past Surgical History:  Procedure Laterality Date  . ANKLE SURGERY Left   . BACK SURGERY    . CARDIAC CATHETERIZATION  06/2008   PCI to LAD with 3.0 x 12 mm DES  . COLONOSCOPY    . EYE SURGERY     remove cataracts - bilateral  . MULTIPLE TOOTH EXTRACTIONS     upper teeth - upper dentures  . OPEN REDUCTION INTERNAL FIXATION (ORIF) DISTAL RADIAL FRACTURE Left 05/16/2019   Procedure: OPEN REDUCTION INTERNAL FIXATION (ORIF) DISTAL RADIAL FRACTURE;  Surgeon: Dairl Ponder, MD;  Location: [facility];  Service: Orthopedics;  Laterality: Left;  . SPINAL FUSION    . UPPER GI ENDOSCOPY      Family History  Problem Relation Age of Onset  . Heart disease Mother   . Asthma Father   . Heart disease Brother 46       CAD    Social History   Socioeconomic History  . Marital status: Widowed    Spouse name: Not on file  . Number of children: 2  . Years of education: Not on file  . Highest education level: Not on file  Occupational History  . Not on file  Tobacco Use  . Smoking status: Never Smoker  . Smokeless tobacco: Never Used  Vaping Use  . Vaping Use: Never used  Substance and Sexual Activity  . Alcohol use: Never  . Drug use: Never  . Sexual activity: Not on file  Other Topics Concern  . Not on file  Social History Narrative  . Not on file   Social Determinants of Health   Financial Resource Strain:   . Difficulty of Paying Living Expenses: Not on file  Food Insecurity:   . Worried About Programme researcher, broadcasting/film/video in the Last Year: Not on file  . Ran Out of Food in the Last Year: Not on file  Transportation Needs:   . Lack of Transportation (Medical): Not on file  . Lack of Transportation (Non-Medical): Not on file  Physical Activity:   . Days of Exercise per Week: Not on file  . Minutes of Exercise per Session: Not on file  Stress:   . Feeling of Stress : Not on file  Social Connections:   . Frequency of Communication with Friends and  Family: Not on file  . Frequency of Social Gatherings with Friends and Family: Not on file  . Attends Religious Services: Not on file  . Active Member of Clubs or Organizations: Not on file  . Attends Banker Meetings: Not on file  . Marital Status: Not on file  Intimate Partner Violence:   . Fear of Current or Ex-Partner: Not on file  . Emotionally Abused: Not on file  . Physically Abused: Not on file  . Sexually Abused: Not on file    Review of Systems  Constitutional: Negative.   HENT: Negative.   Eyes: Negative.   Respiratory: Positive for apnea, cough and shortness of breath.   Cardiovascular: Negative for chest pain.       Chest wall discomfort  Gastrointestinal: Negative.   Endocrine: Negative.   Musculoskeletal: Positive for arthralgias.  Psychiatric/Behavioral: Positive for sleep disturbance.  All other systems reviewed and are negative.   Vitals:   08/17/20 0902  BP: 118/76  Pulse: 71  Temp: 97.8 F (36.6 C)  SpO2: 91%   Physical Exam Constitutional:      Appearance: She is well-developed. She is obese.     Comments: Overweight  HENT:     Head: Normocephalic and atraumatic.     Mouth/Throat:     Mouth: Mucous membranes are moist.  Neck:     Thyroid: No thyromegaly.     Trachea: No tracheal deviation.  Cardiovascular:     Rate and Rhythm: Normal rate and regular rhythm.  Pulmonary:     Effort: Pulmonary effort is normal. No respiratory distress.     Breath sounds: Normal breath sounds. No wheezing or rales.  Chest:     Chest wall: No tenderness.  Musculoskeletal:     Cervical back: Normal range of motion and neck supple.  Skin:    General: Skin is warm and dry.  Neurological:     Mental Status: She is alert.     Comments: Uses a walker    Data Reviewed: CT of the chest reviewed showing midlung atelectasis-unchanged from previous -Reviewed by myself  Echocardiogram with evidence of severe pulmonary hypertension, diastolic  dysfunction -This is likely related to obstructive sleep apnea, chronic hypoxemia, diastolic dysfunction  Ambulated in the office today, did desaturate to 88%, improved with 2 L oxygen supplementation  CPAP compliance shows 100% compliance average use of 6-1/2 hours  95 percentile pressure of 9.8 AHI 1.5  Assessment:  Obstructive sleep apnea -Encouraged to continue using CPAP on a regular basis -Continue to work on weight loss efforts  Nocturnal desaturations -Continue CPAP use  Patient did desaturate with activity -Oxygen supplementation at 2 L with activity  Pulmonary hypertension Multifactorial pulmonary hypertension May be related to nocturnal hypoxemia -Encouraged to continue using CPAP -Oxygen supplementation with activity -Referral to pulmonary hypertension clinic  Shortness of breath with wheezing -Empiric albuterol-encouraged to continue this  Abnormal CT scan of the chest revealing atelectasis -Repeat CT scan of the chest is stable from previous, reviewed by myself  Plan/Recommendations:  Continue using CPAP on a regular basis  Refer to pulmonary hypertension clinic  Hycodan for cough  Continue to sleep with the head of the bed propped up for large hiatal hernia Oxygen supplementation at 2 L with activity  Follow-up in 3 months  Encouraged to call with any significant concerns  Echo results discussed with patient, CT scan stability discussed with patient, encouraged about CPAP use on a regular basis with good compliance Virl Diamond MD [facility] 08/17/2020, 9:34 AM  CC: Izetta Dakin, NP

## 2020-09-10 ENCOUNTER — Ambulatory Visit (INDEPENDENT_AMBULATORY_CARE_PROVIDER_SITE_OTHER): Payer: Medicare (Managed Care) | Admitting: Cardiology

## 2020-09-10 ENCOUNTER — Encounter: Payer: Self-pay | Admitting: Cardiology

## 2020-09-10 ENCOUNTER — Other Ambulatory Visit: Payer: Self-pay

## 2020-09-10 VITALS — BP 138/88 | HR 81 | Ht 59.0 in | Wt 215.0 lb

## 2020-09-10 DIAGNOSIS — I10 Essential (primary) hypertension: Secondary | ICD-10-CM

## 2020-09-10 DIAGNOSIS — I2510 Atherosclerotic heart disease of native coronary artery without angina pectoris: Secondary | ICD-10-CM

## 2020-09-10 DIAGNOSIS — Z79899 Other long term (current) drug therapy: Secondary | ICD-10-CM

## 2020-09-10 DIAGNOSIS — R0602 Shortness of breath: Secondary | ICD-10-CM

## 2020-09-10 DIAGNOSIS — R600 Localized edema: Secondary | ICD-10-CM

## 2020-09-10 DIAGNOSIS — I272 Pulmonary hypertension, unspecified: Secondary | ICD-10-CM

## 2020-09-10 LAB — BASIC METABOLIC PANEL
BUN/Creatinine Ratio: 23 (ref 12–28)
BUN: 26 mg/dL (ref 8–27)
CO2: 29 mmol/L (ref 20–29)
Calcium: 9.6 mg/dL (ref 8.7–10.3)
Chloride: 100 mmol/L (ref 96–106)
Creatinine, Ser: 1.13 mg/dL — ABNORMAL HIGH (ref 0.57–1.00)
GFR calc Af Amer: 52 mL/min/{1.73_m2} — ABNORMAL LOW (ref >59)
GFR calc non Af Amer: 45 mL/min/{1.73_m2} — ABNORMAL LOW (ref >59)
Glucose: 91 mg/dL (ref 65–99)
Potassium: 5.4 mmol/L — ABNORMAL HIGH (ref 3.5–5.2)
Sodium: 140 mmol/L (ref 134–144)

## 2020-09-10 MED ORDER — TORSEMIDE 20 MG PO TABS: 20.0000 mg | ORAL_TABLET | Freq: Two times a day (BID) | ORAL | 3 refills | Status: DC

## 2020-09-10 NOTE — Progress Notes (Signed)
Cardiology Office Note:    Date:  09/10/2020   ID:  Jennifer [name], DOB 1934-04-17, MRN [id number]  PCP:  Jennifer Hedges, NP  Cardiologist:  Jennifer Dresser, MD PhD  Referring MD: Jennifer Hedges, NP   CC: follow up  History of Present Illness:    Jennifer [name] is a 86 y.o. female with a hx of CAD with stable angina, HTN, HLD, sleep apnea, GERD with hiatal hernia/esophageal stricture who is seen for follow up today. I initially met her 01/02/20 as a new patient to me/transfer from Dr. Saunders [name] at the request of Jennifer Hedges, NP for the evaluation and management of CAD.  Cardiac history: PCI to LAD in 2009 in [place]. Had repeat cath in 2014 due to accelerating angina, stent patent, jailed diagonal, no intervention. Nuc stress 2018 without ischemic, echo 2018 with normal LV function and RVSP 26 mmHg. She established with Dr. Saunders [name] in 05/2018 after moving from [place]. Nuclear stress done here in 06/2018 showed breast attenuation but no ischemia. Echo 06/2018 showed normal LV function with pHtn (PASP 55). She had her ranexa dose decreased due to dizziness 08/2018. She had palpitations in 09/2018 and had 30 day event monitor ordered, which showed no significant arrhythmias.   Today: Seen for close follow up given weight gain, swelling at last visit. We increased her lasix dose at that time and checked labs. BNP was 43.She reports no improvement in swelling with increased lasix. Urinates frequently on lasix. Weight now 215 lbs. Watching fluid intake, wearing compressions stockings. Feels that her whole body is swollen. Breathing is unchanged.   Denies chest pain, PND, orthopnea. No syncope or palpitations.  Past Medical History:  Diagnosis Date  . Ambulates with cane    straight  . At risk for falls   . CAD (coronary artery disease)   . Cataracts, bilateral   . GERD (gastroesophageal reflux disease)   . Hypercholesterolemia   . Hypertension   . Hypothyroidism   . Major depression   .  Musculoskeletal back pain    CHRONIC LEFT-SIDED DISCOMFORT  . Obstructive sleep apnea on CPAP    uses CPAP nightly  . Osteopenia   . PVD (peripheral vascular disease) ([place])   . Stable angina ([facility])   . SVD (spontaneous vaginal delivery)    x 2  . Vitamin D deficiency   . Wears glasses   . Wears partial dentures    upper    Past Surgical History:  Procedure Laterality Date  . ANKLE SURGERY Left   . BACK SURGERY    . CARDIAC CATHETERIZATION  06/2008   PCI to LAD with 3.0 x 12 mm DES  . COLONOSCOPY    . EYE SURGERY     remove cataracts - bilateral  . MULTIPLE TOOTH EXTRACTIONS     upper teeth - upper dentures  . OPEN REDUCTION INTERNAL FIXATION (ORIF) DISTAL RADIAL FRACTURE Left 05/16/2019   Procedure: OPEN REDUCTION INTERNAL FIXATION (ORIF) DISTAL RADIAL FRACTURE;  Surgeon: Charlotte Crumb, MD;  Location: [facility];  Service: Orthopedics;  Laterality: Left;  . SPINAL FUSION    . UPPER GI ENDOSCOPY      Current Medications: Current Outpatient Medications on File Prior to Visit  Medication Sig  . acetaminophen (TYLENOL) 650 MG CR tablet Take 650 mg by mouth 2 (two) times a day.  . albuterol (VENTOLIN HFA) 108 (90 Base) MCG/ACT inhaler Inhale 2 puffs into the lungs every 6 (six) hours as needed for wheezing or shortness of breath.  Marland Kitchen  amLODipine (NORVASC) 2.5 MG tablet Take 1 tablet (2.5 mg total) by mouth daily.  Marland Kitchen aspirin EC 81 MG tablet Take 81 mg by mouth daily. CARDIAC EVENT PREVENTION  . benzonatate (TESSALON) 200 MG capsule Take 1 capsule (200 mg total) by mouth 3 (three) times daily as needed for cough.  . Cholecalciferol (VITAMIN D3) 5000 units TABS Take 5,000 Units by mouth daily with breakfast.   . enalapril (VASOTEC) 20 MG tablet Take 20 mg by mouth daily.  Marland Kitchen escitalopram (LEXAPRO) 5 MG tablet Take 5 mg by mouth daily.  . furosemide (LASIX) 20 MG tablet Take 1 tablet (20 mg total) by mouth 2 (two) times daily for 7 days, THEN 1 tablet (20 mg total) daily.  Marland Kitchen  HYDROcodone-homatropine (HYCODAN) 5-1.5 MG/5ML syrup Take 5 mLs by mouth every 6 (six) hours as needed for cough.  . levothyroxine (SYNTHROID, LEVOTHROID) 50 MCG tablet Take 50 mcg by mouth daily before breakfast.  . mirtazapine (REMERON) 7.5 MG tablet Take 7.5 mg by mouth at bedtime.  . Multiple Vitamins-Minerals (PRESERVISION AREDS 2) CAPS Take 1 tablet by mouth daily.  . nitroGLYCERIN (NITROSTAT) 0.4 MG SL tablet Place 0.4 mg under the tongue every 5 (five) minutes as needed for chest pain.  . pantoprazole (PROTONIX) 20 MG tablet Take 20 mg by mouth daily.  . pravastatin (PRAVACHOL) 80 MG tablet Take 80 mg by mouth every evening.  . ranolazine (RANEXA) 500 MG 12 hr tablet Take 500 mg by mouth 2 (two) times daily.  Marland Kitchen Spacer/Aero-Holding Chambers (AEROCHAMBER MV) inhaler Use as instructed  . traMADol (ULTRAM) 50 MG tablet Take 50 mg by mouth daily as needed for moderate pain.   No current facility-administered medications on file prior to visit.     Allergies:   Patient has no known allergies.   Social History   Tobacco Use  . Smoking status: Never Smoker  . Smokeless tobacco: Never Used  Vaping Use  . Vaping Use: Never used  Substance Use Topics  . Alcohol use: Never  . Drug use: Never    Family History: The patient's family history includes Asthma in her father; Heart disease in her mother; Heart disease (age of onset: 95) in her brother.  ROS:   Please see the history of present illness.  Additional pertinent ROS negative except as documented.   EKGs/Labs/Other Studies Reviewed:    The following studies were reviewed today: Monitor 10/21/18  The patient was enrolled for 30 days. 60% of the monitoring period yielded diagnostic tracings.  The predominant rhythm was sinus with an average rate of 72 bpm (range 51 to 125 bpm).  Isolated PACs and PVCs were noted.  No sustained arrhythmia or prolonged pause was identified.   Predominantly sinus rhythm with isolated PACs  and PVCs.  No sustained arrhythmia.  lexiscan 7.11.19  The left ventricular ejection fraction is hyperdynamic (>65%).  Nuclear stress EF: 71%.  There was no ST segment deviation noted during stress.  There is a small defect of mild severity present in the basal anteroseptal, mid anteroseptal and apical septal location. The defect is non-reversible that is consistent with breast attenuation artifact. No ischemia noted.  This is a low risk study.   Echo 06/17/18 - Left ventricle: The cavity size was normal. Wall thickness was   normal. Systolic function was normal. The estimated ejection   fraction was in the range of 60% to 65%. Wall motion was normal;   there were no regional wall motion abnormalities. Left  ventricular diastolic function parameters were normal. - Mitral valve: Calcified annulus. There was mild regurgitation. - Pulmonary arteries: Systolic pressure was moderately increased.   PA peak pressure: 55 mm Hg (S).  Impressions: - Normal LV systolic function; mild MR; trace TR with moderate   pulmonary hypertension.  EKG:  EKG is personally reviewed.  The ekg ordered 08/10/20 demonstrates normal sinus rhythm at 74 bpm  Recent Labs: 08/10/2020: BNP 43.2; BUN 25; Creatinine, Ser 1.19; Potassium 5.5; Sodium 140  Recent Lipid Panel    Component Value Date/Time   CHOL 127 09/02/2019 0903   TRIG 57 09/02/2019 0903   HDL 77 09/02/2019 0903   CHOLHDL 1.6 09/02/2019 0903   LDLCALC 37 09/02/2019 0903    Physical Exam:    VS:  BP 138/88   Pulse 81   Ht 4' 11" (1.499 m)   Wt 215 lb (97.5 kg)   LMP  (LMP Unknown)   SpO2 97%   BMI 43.42 kg/m     Wt Readings from Last 3 Encounters:  09/10/20 215 lb (97.5 kg)  08/17/20 210 lb 12.8 oz (95.6 kg)  08/10/20 210 lb (95.3 kg)    GEN: Well nourished, well developed in no acute distress HEENT: Normal, moist mucous membranes NECK: No JVD CARDIAC: regular rhythm, normal S1 and S2, no rubs or gallops. No murmur. VASCULAR:  Radial and DP pulses 2+ bilaterally. No carotid bruits RESPIRATORY:  Clear but bilateral expiratory wheeze ABDOMEN: Soft, non-tender, non-distended MUSCULOSKELETAL:  Ambulates independently SKIN: Warm and dry, trace bilateral LE edema NEUROLOGIC:  No focal neuro deficits noted. PSYCHIATRIC:  Normal affect   ASSESSMENT:    1. Shortness of breath   2. Bilateral leg edema   3. Medication management   4. Essential hypertension   5. Coronary artery disease involving native coronary artery of native heart without angina pectoris   6. Pulmonary hypertension, unspecified ([place])    PLAN:    Dyspnea, LE edema, weight gain:  -prior cardiac evaluation without clear etiology -no improvement on lasix and has had weight gain -will check BMET, change to torsemide in case gut edema is preventing absorption of lasix -will recheck echocardiogram given progressive symptoms -follows with pulmonology for sleep apnea, pulmonary hypertension. Concern that this may be worsening pulmonary hypertension  CAD: denies angina at this time. Tolerating medications well -continue aspirin 81 mg, pravastatin 80 mg, ranolazine 500 mg BID -has SL NG -counseled on red flag warning signs requiring immediate medical attention -has PRN SL NG, has not required  Hypertension: slightly elevated today, goal <130/80 -continue enalapril, low dose amlodipine -changing to torsemide as above -had issues with beta blocker in the past  Secondary prevention -recommend heart healthy/Mediterranean diet, with whole grains, fruits, vegetable, fish, lean meats, nuts, and olive oil. Limit salt. -recommend avoidance of tobacco products. Avoid excess alcohol. -Additional risk factor control:  -Hyperlipidemia: LDL goal <70. On atorvastatin  -Blood pressure control: as above  -Weight: BMI 39, counseled on diet. Limited ability for exercise given mobility, encouraged to safely do what she can.  Plan for follow up: 3-4 weeks  Copy of  note will be sent to: [facility] [address] [place], [place] [address] Fax [phone]  Medication Adjustments/Labs and Tests Ordered: Current medicines are reviewed at length with the patient today.  Concerns regarding medicines are outlined above.  Orders Placed This Encounter  Procedures  . Basic metabolic panel  . ECHOCARDIOGRAM COMPLETE   Meds ordered this encounter  Medications  .  torsemide (DEMADEX) 20 MG tablet    Sig: Take 1 tablet (20 mg total) by mouth 2 (two) times daily.    Dispense:  180 tablet    Refill:  3    Patient Instructions  Medication Instructions:  Stop taking Lasix 20 mg Start: Torsemide 20 mg twice a day  *If you need a refill on your cardiac medications before your next appointment, please call your pharmacy*   Lab Work: Your physician recommends that you return for lab work today ( BMP).  If you have labs (blood work) drawn today and your tests are completely normal, you will receive your results only by: Marland Kitchen MyChart Message (if you have MyChart) OR . A paper copy in the mail If you have any lab test that is abnormal or we need to change your treatment, we will call you to review the results.   Testing/Procedures: Your physician has requested that you have an echocardiogram. Echocardiography is a painless test that uses sound waves to create images of your heart. It provides your doctor with information about the size and shape of your heart and how well your heart's chambers and valves are working. This procedure takes approximately one hour. There are no restrictions for this procedure. [address]    Follow-Up: At [facility], you and your health needs are our priority.  As part of our continuing mission to provide you with exceptional heart care, we have created designated Provider Care Teams.  These Care Teams include your primary Cardiologist (physician) and Advanced Practice Providers (APPs -  Physician  Assistants and Nurse Practitioners) who all work together to provide you with the care you need, when you need it.  We recommend signing up for the patient portal called "MyChart".  Sign up information is provided on this After Visit Summary.  MyChart is used to connect with patients for Virtual Visits (Telemedicine).  Patients are able to view lab/test results, encounter notes, upcoming appointments, etc.  Non-urgent messages can be sent to your provider as well.   To learn more about what you can do with MyChart, go to [email or web address].    Your next appointment:   3-4 week(s)   The format for your next appointment:   In Person  Provider:   Buford Dresser, MD        Signed, Jennifer Dresser, MD PhD 09/10/2020   [place]

## 2020-09-10 NOTE — Patient Instructions (Signed)
Medication Instructions:  Stop taking Lasix 20 mg Start: Torsemide 20 mg twice a day  *If you need a refill on your cardiac medications before your next appointment, please call your pharmacy*   Lab Work: Your physician recommends that you return for lab work today ( BMP).  If you have labs (blood work) drawn today and your tests are completely normal, you will receive your results only by: Marland Kitchen MyChart Message (if you have MyChart) OR . A paper copy in the mail If you have any lab test that is abnormal or we need to change your treatment, we will call you to review the results.   Testing/Procedures: Your physician has requested that you have an echocardiogram. Echocardiography is a painless test that uses sound waves to create images of your heart. It provides your doctor with information about the size and shape of your heart and how well your heart's chambers and valves are working. This procedure takes approximately one hour. There are no restrictions for this procedure. [address]    Follow-Up: At [facility], you and your health needs are our priority.  As part of our continuing mission to provide you with exceptional heart care, we have created designated Provider Care Teams.  These Care Teams include your primary Cardiologist (physician) and Advanced Practice Providers (APPs -  Physician Assistants and Nurse Practitioners) who all work together to provide you with the care you need, when you need it.  We recommend signing up for the patient portal called "MyChart".  Sign up information is provided on this After Visit Summary.  MyChart is used to connect with patients for Virtual Visits (Telemedicine).  Patients are able to view lab/test results, encounter notes, upcoming appointments, etc.  Non-urgent messages can be sent to your provider as well.   To learn more about what you can do with MyChart, go to [email or web address].    Your next appointment:    3-4 week(s)   The format for your next appointment:   In Person  Provider:   Jodelle Red, MD

## 2020-09-13 IMAGING — CR LEFT WRIST - COMPLETE 3+ VIEW
4 series · 4 of 4 positions shown · non-contrast
Comparison: None.

CLINICAL DATA: Fall, pain and deformity.

EXAM:
LEFT WRIST - COMPLETE 3+ VIEW

[x wrist pa left]
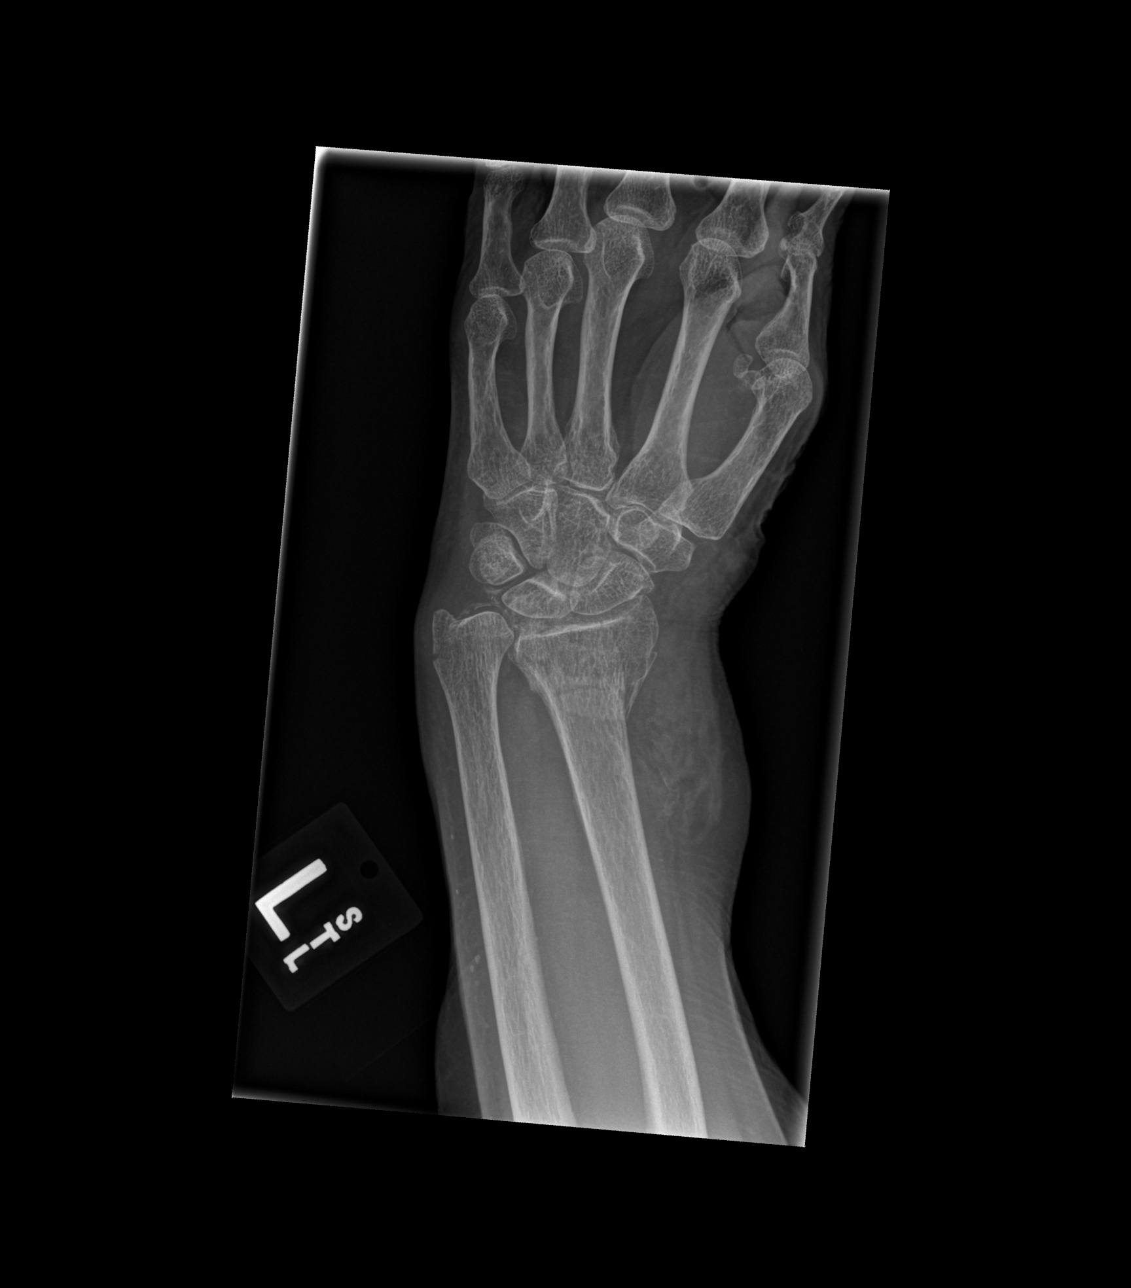

[x wrist obl left]
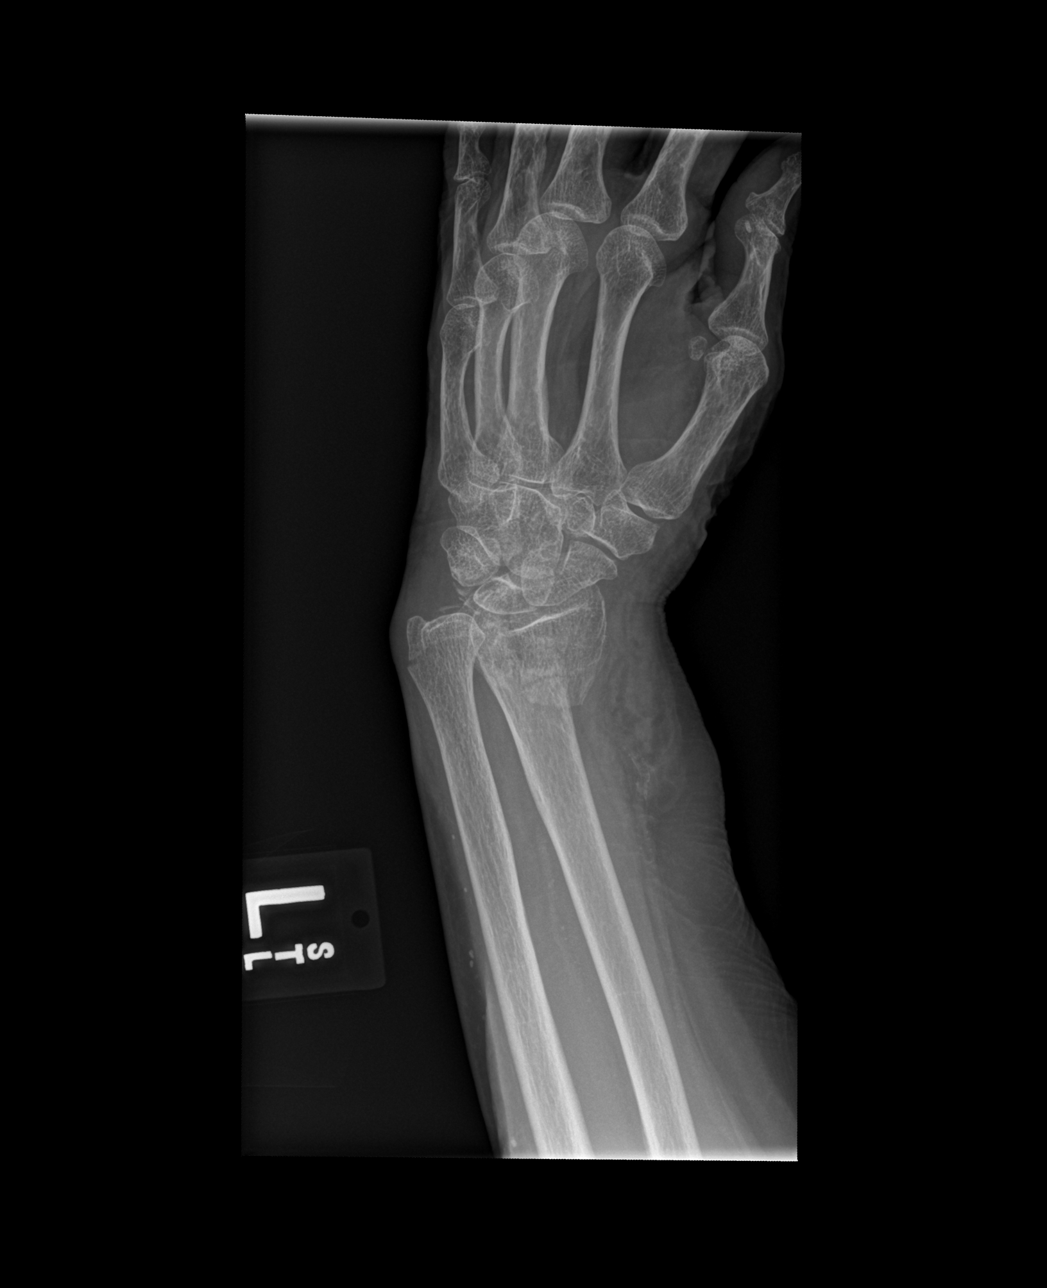

[x wrist lat left (1 of 2)]
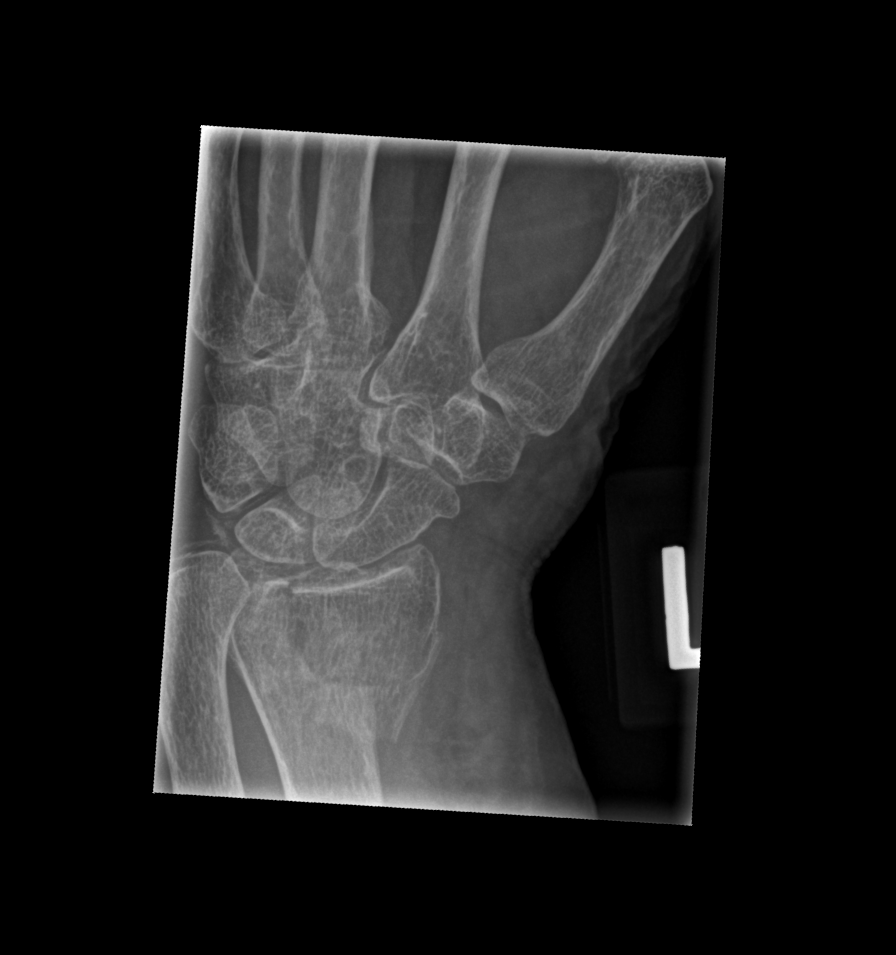

[x wrist lat left (2 of 2)]
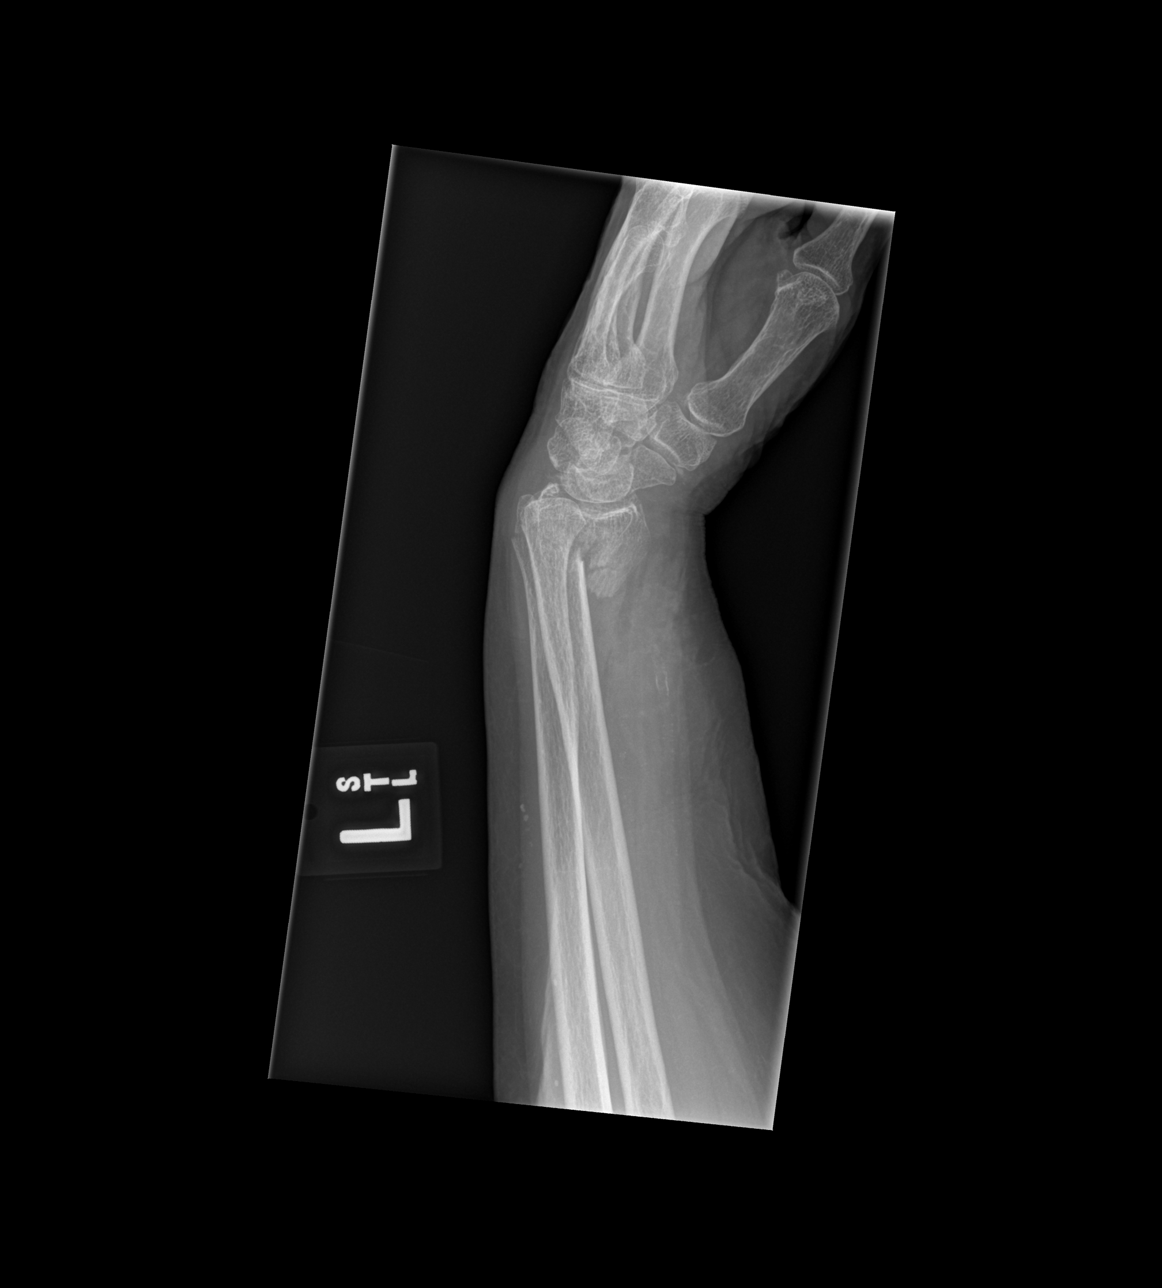

[4 of 4 positions shown; findings below may reference images not displayed]

FINDINGS: Displaced/comminuted fracture of the distal LEFT radius with
anterior displacement of the main component of the distal fracture
fragment and overriding of the main fracture fragments.

Slightly displaced fracture at the base of the ulnar styloid. Carpal
bones appear intact. Chondrocalcinosis at the TFCC indicating
underlying CPPD.
IMPRESSION: 1. Displaced/comminuted fracture of the distal LEFT radius, as
detailed above.
2. Slightly displaced fracture at the base of the ulnar styloid.

## 2020-09-23 ENCOUNTER — Encounter: Payer: Self-pay | Admitting: Cardiology

## 2020-09-28 ENCOUNTER — Ambulatory Visit (HOSPITAL_COMMUNITY): Payer: Medicare (Managed Care) | Attending: Cardiology

## 2020-09-28 ENCOUNTER — Other Ambulatory Visit: Payer: Self-pay

## 2020-09-28 DIAGNOSIS — R0602 Shortness of breath: Secondary | ICD-10-CM | POA: Insufficient documentation

## 2020-09-28 DIAGNOSIS — R600 Localized edema: Secondary | ICD-10-CM | POA: Insufficient documentation

## 2020-09-28 LAB — ECHOCARDIOGRAM COMPLETE
Area-P 1/2: 3.91 cm2
S' Lateral: 2.6 cm

## 2020-10-10 ENCOUNTER — Encounter: Payer: Self-pay | Admitting: Cardiology

## 2020-10-10 ENCOUNTER — Ambulatory Visit (INDEPENDENT_AMBULATORY_CARE_PROVIDER_SITE_OTHER): Payer: Medicare (Managed Care) | Admitting: Cardiology

## 2020-10-10 ENCOUNTER — Other Ambulatory Visit: Payer: Self-pay

## 2020-10-10 VITALS — BP 128/62 | HR 88 | Ht 59.0 in | Wt 207.0 lb

## 2020-10-10 DIAGNOSIS — R0602 Shortness of breath: Secondary | ICD-10-CM | POA: Diagnosis not present

## 2020-10-10 DIAGNOSIS — Z7189 Other specified counseling: Secondary | ICD-10-CM

## 2020-10-10 DIAGNOSIS — I2510 Atherosclerotic heart disease of native coronary artery without angina pectoris: Secondary | ICD-10-CM | POA: Diagnosis not present

## 2020-10-10 DIAGNOSIS — R600 Localized edema: Secondary | ICD-10-CM

## 2020-10-10 DIAGNOSIS — I272 Pulmonary hypertension, unspecified: Secondary | ICD-10-CM

## 2020-10-10 DIAGNOSIS — I10 Essential (primary) hypertension: Secondary | ICD-10-CM

## 2020-10-10 NOTE — Patient Instructions (Signed)

## 2020-10-10 NOTE — Progress Notes (Signed)
Cardiology Office Note:    Date:  10/10/2020   ID:  Jennifer [name], DOB [birth year], MRN [id number]  PCP:  Linda Hedges, NP  Cardiologist:  Buford Dresser, MD PhD  Referring MD: Linda Hedges, NP   CC: follow up  History of Present Illness:    Jennifer [name] is a 86 y.o. female with a hx of CAD with stable angina, HTN, HLD, sleep apnea, GERD with hiatal hernia/esophageal stricture, pulmonary hypertension with chronic hypoxemic respiratory failure who is seen for follow up today. I initially met her 01/02/20 as a new patient to me/transfer from Dr. Saunders Revel at the request of Linda Hedges, NP for the evaluation and management of CAD.  Cardiac history: PCI to LAD in 2009 in [place]. Had repeat cath in 2014 due to accelerating angina, stent patent, jailed diagonal, no intervention. Nuc stress 2018 without ischemic, echo 2018 with normal LV function and RVSP 26 mmHg. She established with Dr. Saunders Revel in 05/2018 after moving from [place]. Nuclear stress done here in 06/2018 showed breast attenuation but no ischemia. Echo 06/2018 showed normal LV function with pHtn (PASP 55). She had her ranexa dose decreased due to dizziness 08/2018. She had palpitations in 09/2018 and had 30 day event monitor ordered, which showed no significant arrhythmias.   Today: Feeling somewhat better with change to torsemide. Weight down about 8 lbs. Feels like leg swelling is improved in her legs. She thought she was making good urine before, but now having very brisk urine output on torsemide. Reviewed recent labs. Cr 1.33, up slightly from prior, but K stable.  Reviewed her echo report. LVEF hyperdynamic, only grade 1 diastolic dysfunction. RVSP elevated at 57 mmHg, consistent with her known pulmonary hypertension. This is suggestive that it is elevated right sided pressures leading to edema.  Now has oxygen concentrator, trying to do more walking. Wants to travel with her grandson to the [place]   Not  sleeping well, coughs a lot at night, CPAP is loud. Using inhaler helps her cough.  Denies chest pain, shortness of breath at res. No PND or unexpected weight gain. No syncope or palpitations. Orthopnea, LE edema improved slightly.  Past Medical History:  Diagnosis Date  . Ambulates with cane    straight  . At risk for falls   . CAD (coronary artery disease)   . Cataracts, bilateral   . GERD (gastroesophageal reflux disease)   . Hypercholesterolemia   . Hypertension   . Hypothyroidism   . Major depression   . Musculoskeletal back pain    CHRONIC LEFT-SIDED DISCOMFORT  . Obstructive sleep apnea on CPAP    uses CPAP nightly  . Osteopenia   . PVD (peripheral vascular disease) ([facility])   . Stable angina (HCC)   . SVD (spontaneous vaginal delivery)    x 2  . Vitamin D deficiency   . Wears glasses   . Wears partial dentures    upper    Past Surgical History:  Procedure Laterality Date  . ANKLE SURGERY Left   . BACK SURGERY    . CARDIAC CATHETERIZATION  06/2008   PCI to LAD with 3.0 x 12 mm DES  . COLONOSCOPY    . EYE SURGERY     remove cataracts - bilateral  . MULTIPLE TOOTH EXTRACTIONS     upper teeth - upper dentures  . OPEN REDUCTION INTERNAL FIXATION (ORIF) DISTAL RADIAL FRACTURE Left 05/16/2019   Procedure: OPEN REDUCTION INTERNAL FIXATION (ORIF) DISTAL RADIAL FRACTURE;  Surgeon: Charlotte Crumb, MD;  Location: [facility];  Service: Orthopedics;  Laterality: Left;  . SPINAL FUSION    . UPPER GI ENDOSCOPY      Current Medications: Current Outpatient Medications on File Prior to Visit  Medication Sig  . acetaminophen (TYLENOL) 650 MG CR tablet Take 650 mg by mouth 2 (two) times a day.  . albuterol (VENTOLIN HFA) 108 (90 Base) MCG/ACT inhaler Inhale 2 puffs into the lungs every 6 (six) hours as needed for wheezing or shortness of breath.  Marland Kitchen amLODipine (NORVASC) 2.5 MG tablet Take 1 tablet (2.5 mg total) by mouth daily.  Marland Kitchen aspirin EC 81 MG tablet Take 81 mg by mouth daily.  CARDIAC EVENT PREVENTION  . benzonatate (TESSALON) 200 MG capsule Take 1 capsule (200 mg total) by mouth 3 (three) times daily as needed for cough.  . Cholecalciferol (VITAMIN D3) 5000 units TABS Take 5,000 Units by mouth daily with breakfast.   . enalapril (VASOTEC) 20 MG tablet Take 20 mg by mouth daily.  Marland Kitchen escitalopram (LEXAPRO) 5 MG tablet Take 5 mg by mouth daily.  Marland Kitchen HYDROcodone-homatropine (HYCODAN) 5-1.5 MG/5ML syrup Take 5 mLs by mouth every 6 (six) hours as needed for cough.  . levothyroxine (SYNTHROID, LEVOTHROID) 50 MCG tablet Take 50 mcg by mouth daily before breakfast.  . mirtazapine (REMERON) 7.5 MG tablet Take 7.5 mg by mouth at bedtime.  . Multiple Vitamins-Minerals (PRESERVISION AREDS 2) CAPS Take 1 tablet by mouth daily.  . nitroGLYCERIN (NITROSTAT) 0.4 MG SL tablet Place 0.4 mg under the tongue every 5 (five) minutes as needed for chest pain.  . pantoprazole (PROTONIX) 20 MG tablet Take 20 mg by mouth daily.  . pravastatin (PRAVACHOL) 80 MG tablet Take 80 mg by mouth every evening.  . ranolazine (RANEXA) 500 MG 12 hr tablet Take 500 mg by mouth 2 (two) times daily.  Marland Kitchen Spacer/Aero-Holding Chambers (AEROCHAMBER MV) inhaler Use as instructed  . torsemide (DEMADEX) 20 MG tablet Take 1 tablet (20 mg total) by mouth 2 (two) times daily.  . traMADol (ULTRAM) 50 MG tablet Take 50 mg by mouth daily as needed for moderate pain.   No current facility-administered medications on file prior to visit.     Allergies:   Patient has no known allergies.   Social History   Tobacco Use  . Smoking status: Never Smoker  . Smokeless tobacco: Never Used  Vaping Use  . Vaping Use: Never used  Substance Use Topics  . Alcohol use: Never  . Drug use: Never    Family History: The patient's family history includes Asthma in her father; Heart disease in her mother; Heart disease (age of onset: 24) in her brother.  ROS:   Please see the history of present illness.  Additional pertinent ROS  negative except as documented.   EKGs/Labs/Other Studies Reviewed:    The following studies were reviewed today: Echo 09/28/20 1. Left ventricular ejection fraction, by estimation, is 65 to 70%. Left  ventricular ejection fraction by 3D volume is 67 %. The left ventricle has  normal function. The left ventricle has no regional wall motion  abnormalities. Left ventricular diastolic  parameters are consistent with Grade I diastolic dysfunction (impaired  relaxation).  2. Right ventricular systolic function is normal. The right ventricular  size is normal. There is moderately elevated pulmonary artery systolic  pressure. The estimated right ventricular systolic pressure is 96.2 mmHg.  3. The mitral valve is normal in structure. No evidence of mitral valve  regurgitation. No evidence of mitral stenosis.  4. The aortic valve is normal in structure. Aortic valve regurgitation is  not visualized. No aortic stenosis is present.  5. The inferior vena cava is normal in size with greater than 50%  respiratory variability, suggesting right atrial pressure of 3 mmHg.   Comparison(s): Prior images reviewed side by side. Changes from prior  study are noted. 02/23/20 EF 60-65%. GLS -25.3%. PA pressure 35mHg.  Monitor 10/21/18  The patient was enrolled for 30 days. 60% of the monitoring period yielded diagnostic tracings.  The predominant rhythm was sinus with an average rate of 72 bpm (range 51 to 125 bpm).  Isolated PACs and PVCs were noted.  No sustained arrhythmia or prolonged pause was identified.   Predominantly sinus rhythm with isolated PACs and PVCs.  No sustained arrhythmia.  lexiscan 7.11.19  The left ventricular ejection fraction is hyperdynamic (>65%).  Nuclear stress EF: 71%.  There was no ST segment deviation noted during stress.  There is a small defect of mild severity present in the basal anteroseptal, mid anteroseptal and apical septal location. The defect is  non-reversible that is consistent with breast attenuation artifact. No ischemia noted.  This is a low risk study.   Echo 06/17/18 - Left ventricle: The cavity size was normal. Wall thickness was   normal. Systolic function was normal. The estimated ejection   fraction was in the range of 60% to 65%. Wall motion was normal;   there were no regional wall motion abnormalities. Left   ventricular diastolic function parameters were normal. - Mitral valve: Calcified annulus. There was mild regurgitation. - Pulmonary arteries: Systolic pressure was moderately increased.   PA peak pressure: 55 mm Hg (S).  Impressions: - Normal LV systolic function; mild MR; trace TR with moderate   pulmonary hypertension.  EKG:  EKG is personally reviewed.  The ekg ordered 08/10/20 demonstrates normal sinus rhythm at 74 bpm  Recent Labs: 08/10/2020: BNP 43.2 09/10/2020: BUN 26; Creatinine, Ser 1.13; Potassium 5.4; Sodium 140  Recent Lipid Panel    Component Value Date/Time   CHOL 127 09/02/2019 0903   TRIG 57 09/02/2019 0903   HDL 77 09/02/2019 0903   CHOLHDL 1.6 09/02/2019 0903   LDLCALC 37 09/02/2019 0903    Physical Exam:    VS:  BP 128/62   Pulse 88   Ht _0  (1.499 m)   Wt 207 lb (93.9 kg)   LMP  (LMP Unknown)   SpO2 90%   BMI 41.81 kg/m     Wt Readings from Last 3 Encounters:  10/10/20 207 lb (93.9 kg)  09/10/20 215 lb (97.5 kg)  08/17/20 210 lb 12.8 oz (95.6 kg)    GEN: Well nourished, well developed in no acute distress. O2 by nasal cannula, with portable concentrator HEENT: Normal, moist mucous membranes NECK: JVD to low neck at 90 degrees CARDIAC: regular rhythm, normal S1 and S2, no rubs or gallops. No murmur. VASCULAR: Radial pulses 2+ bilaterally. DP pulses 1+ bilaterally. RESPIRATORY:  Distant but clear, no crackles, faint expiratory wheeze bilaterally ABDOMEN: Soft, non-tender, non-distended MUSCULOSKELETAL:  Ambulates independently with rollator SKIN: Warm and dry.  Bilateral compression stockings in place with trace bilateral LE edema. NEUROLOGIC:  Alert and oriented x 3. No focal neuro deficits noted. PSYCHIATRIC:  Normal affect   ASSESSMENT:    1. Shortness of breath   2. Bilateral leg edema   3. Pulmonary hypertension, unspecified ([facility]   4. Coronary artery disease involving native coronary artery of native heart without angina pectoris  5. Essential hypertension   6. Counseling on health promotion and disease prevention    PLAN:    Dyspnea, LE edema, weight gain:  -echo with hyperdynamic LVEF, only grade 1 diastolic dysfunction, and elevated right sided pressures -consistent with likely pulmonary hypertension as cause -improved symptoms with change to torsemide and use of portable oxygen concentrator -would continue BID torsemide as she is doing well on this, she has her labs monitored periodically, watch renal faunction -follows with pulmonology for sleep apnea, pulmonary hypertension  CAD: denies angina at this time. Tolerating medications well -continue aspirin 81 mg, pravastatin 80 mg, ranolazine 500 mg BID -counseled on red flag warning signs requiring immediate medical attention -has PRN SL NG, has not required  Hypertension: at goal <130/80 -continue enalapril, low dose amlodipine -continue torsemide as above -had issues with beta blocker in the past  Secondary prevention -recommend heart healthy/Mediterranean diet, with whole grains, fruits, vegetable, fish, lean meats, nuts, and olive oil. Limit salt. -recommend avoidance of tobacco products. Avoid excess alcohol.  Plan for follow up: 3 mos or sooner as needed  Copy of note will be sent to: [facility] [place], [place] [address] Fax [phone]  Medication Adjustments/Labs and Tests Ordered: Current medicines are reviewed at length with the patient today.  Concerns regarding medicines are outlined above.  No orders of the defined types were  placed in this encounter.  No orders of the defined types were placed in this encounter.   Patient Instructions  Medication Instructions:  Your Physician recommend you continue on your current medication as directed.    *If you need a refill on your cardiac medications before your next appointment, please call your pharmacy*   Lab Work: None   Testing/Procedures: None   Follow-Up: At [facility], you and your health needs are our priority.  As part of our continuing mission to provide you with exceptional heart care, we have created designated Provider Care Teams.  These Care Teams include your primary Cardiologist (physician) and Advanced Practice Providers (APPs -  Physician Assistants and Nurse Practitioners) who all work together to provide you with the care you need, when you need it.  We recommend signing up for the patient portal called "MyChart".  Sign up information is provided on this After Visit Summary.  MyChart is used to connect with patients for Virtual Visits (Telemedicine).  Patients are able to view lab/test results, encounter notes, upcoming appointments, etc.  Non-urgent messages can be sent to your provider as well.   To learn more about what you can do with MyChart, go to [email or web address].    Your next appointment:   3 month(s)  The format for your next appointment:   In Person  Provider:   Buford Dresser, MD       Signed, Buford Dresser, MD PhD 10/10/2020   [place]

## 2020-10-11 ENCOUNTER — Ambulatory Visit
Admission: RE | Admit: 2020-10-11 | Discharge: 2020-10-11 | Disposition: A | Discharge status: Home / Self Care | Payer: Medicare (Managed Care) | Source: Ambulatory Visit | Attending: Gerontology | Admitting: Gerontology

## 2020-10-11 ENCOUNTER — Other Ambulatory Visit: Payer: Self-pay | Admitting: Gerontology

## 2020-10-11 DIAGNOSIS — R52 Pain, unspecified: Secondary | ICD-10-CM

## 2020-10-17 ENCOUNTER — Encounter (HOSPITAL_COMMUNITY): Payer: Medicare (Managed Care) | Admitting: Internal Medicine

## 2020-11-16 ENCOUNTER — Encounter (HOSPITAL_COMMUNITY): Payer: Medicare (Managed Care) | Admitting: Internal Medicine

## 2021-01-04 ENCOUNTER — Ambulatory Visit (INDEPENDENT_AMBULATORY_CARE_PROVIDER_SITE_OTHER): Payer: Medicare (Managed Care) | Admitting: Pulmonary Disease

## 2021-01-04 ENCOUNTER — Other Ambulatory Visit: Payer: Self-pay

## 2021-01-04 ENCOUNTER — Encounter: Payer: Self-pay | Admitting: Pulmonary Disease

## 2021-01-04 VITALS — BP 110/70 | HR 65 | Temp 97.0°F | Ht <= 58 in | Wt 204.6 lb

## 2021-01-04 DIAGNOSIS — R0602 Shortness of breath: Secondary | ICD-10-CM

## 2021-01-04 DIAGNOSIS — G4733 Obstructive sleep apnea (adult) (pediatric): Secondary | ICD-10-CM | POA: Diagnosis not present

## 2021-01-04 DIAGNOSIS — Z9989 Dependence on other enabling machines and devices: Secondary | ICD-10-CM

## 2021-01-04 NOTE — Patient Instructions (Signed)
I will see you back in about 3 months  Let us know if you are still having any problems with your CPAP once the mask is changed on Monday  Continue use of albuterol as needed  Increase activity slowly as tolerated  Call with any significant concerns

## 2021-01-04 NOTE — Progress Notes (Signed)
Jennifer [name]    [id number]    [birth year]  Primary Care Physician:Fernald, Lyla Son, NP (Inactive)  Referring Physician: No referring provider defined for this encounter.  Chief complaint:    Follow-up obstructive sleep apnea She does have pulmonary hypertension Cough and shortness of breath is better compared to previous  HPI: Cough is improved from last visit -Usually coughs at night -Related to hiatal hernia which is a chronic problem, not having significant symptoms of reflux  Denies any significant chest pain or chest discomfort Shortness of breath with activity is persistent -Multifactorial reasons for shortness of breath, significant musculoskeletal dysfunction and pulmonary hypertension  Exercise tolerance is less than a block Ambulates with a walker Not wheezing as much Uses oxygen around-the-clock  Has not been using the CPAP regularly as she is having significant issues with the mask -Has an appointment to pick up a new mask in about 3 to 4 days -Has been using oxygen on a regular basis at night  Usually goes to bed about 11 PM, wakes up about 5:30 in the morning Cough and reflux is much better  Previous history Patient recently moved here from [place], did speak with Dr. Avie Arenas regarding a CT Repeat CT significant for some atelectasis She has a history of moderate pulmonary hypertension-echo did confirm moderate pulmonary hypertension-2019   Outpatient Encounter Medications as of 01/04/2021  Medication Sig  . acetaminophen (TYLENOL) 650 MG CR tablet Take 650 mg by mouth 2 (two) times a day.  . albuterol (VENTOLIN HFA) 108 (90 Base) MCG/ACT inhaler Inhale 2 puffs into the lungs every 6 (six) hours as needed for wheezing or shortness of breath.  Marland Kitchen amLODipine (NORVASC) 2.5 MG tablet Take 1 tablet (2.5 mg total) by mouth daily.  Marland Kitchen aspirin EC 81 MG tablet Take 81 mg by mouth daily. CARDIAC EVENT PREVENTION  . benzonatate (TESSALON) 200 MG capsule Take  1 capsule (200 mg total) by mouth 3 (three) times daily as needed for cough.  . Cholecalciferol (VITAMIN D3) 5000 units TABS Take 5,000 Units by mouth daily with breakfast.   . enalapril (VASOTEC) 20 MG tablet Take 20 mg by mouth daily.  Marland Kitchen escitalopram (LEXAPRO) 5 MG tablet Take 5 mg by mouth daily.  Marland Kitchen HYDROcodone-homatropine (HYCODAN) 5-1.5 MG/5ML syrup Take 5 mLs by mouth every 6 (six) hours as needed for cough.  . levothyroxine (SYNTHROID, LEVOTHROID) 50 MCG tablet Take 50 mcg by mouth daily before breakfast.  . mirtazapine (REMERON) 7.5 MG tablet Take 7.5 mg by mouth at bedtime.  . Multiple Vitamins-Minerals (PRESERVISION AREDS 2) CAPS Take 1 tablet by mouth daily.  . nitroGLYCERIN (NITROSTAT) 0.4 MG SL tablet Place 0.4 mg under the tongue every 5 (five) minutes as needed for chest pain.  . pantoprazole (PROTONIX) 20 MG tablet Take 20 mg by mouth daily.  . pravastatin (PRAVACHOL) 80 MG tablet Take 80 mg by mouth every evening.  . ranolazine (RANEXA) 500 MG 12 hr tablet Take 500 mg by mouth 2 (two) times daily.  Marland Kitchen Spacer/Aero-Holding Chambers (AEROCHAMBER MV) inhaler Use as instructed  . torsemide (DEMADEX) 20 MG tablet Take 1 tablet (20 mg total) by mouth 2 (two) times daily.  . traMADol (ULTRAM) 50 MG tablet Take 50 mg by mouth daily as needed for moderate pain.   No facility-administered encounter medications on file as of 01/04/2021.    Allergies as of 01/04/2021  . (No Known Allergies)    Past Medical History:  Diagnosis Date  .  Ambulates with cane    straight  . At risk for falls   . CAD (coronary artery disease)   . Cataracts, bilateral   . GERD (gastroesophageal reflux disease)   . Hypercholesterolemia   . Hypertension   . Hypothyroidism   . Major depression   . Musculoskeletal back pain    CHRONIC LEFT-SIDED DISCOMFORT  . Obstructive sleep apnea on CPAP    uses CPAP nightly  . Osteopenia   . PVD (peripheral vascular disease) ([facility])   . Stable angina ([facility])   . SVD  (spontaneous vaginal delivery)    x 2  . Vitamin D deficiency   . Wears glasses   . Wears partial dentures    upper    Past Surgical History:  Procedure Laterality Date  . ANKLE SURGERY Left   . BACK SURGERY    . CARDIAC CATHETERIZATION  06/2008   PCI to LAD with 3.0 x 12 mm DES  . COLONOSCOPY    . EYE SURGERY     remove cataracts - bilateral  . MULTIPLE TOOTH EXTRACTIONS     upper teeth - upper dentures  . OPEN REDUCTION INTERNAL FIXATION (ORIF) DISTAL RADIAL FRACTURE Left 05/16/2019   Procedure: OPEN REDUCTION INTERNAL FIXATION (ORIF) DISTAL RADIAL FRACTURE;  Surgeon: Dairl Ponder, MD;  Location: [facility];  Service: Orthopedics;  Laterality: Left;  . SPINAL FUSION    . UPPER GI ENDOSCOPY      Family History  Problem Relation Age of Onset  . Heart disease Mother   . Asthma Father   . Heart disease Brother 74       CAD    Social History   Socioeconomic History  . Marital status: Widowed    Spouse name: Not on file  . Number of children: 2  . Years of education: Not on file  . Highest education level: Not on file  Occupational History  . Not on file  Tobacco Use  . Smoking status: Never Smoker  . Smokeless tobacco: Never Used  Vaping Use  . Vaping Use: Never used  Substance and Sexual Activity  . Alcohol use: Never  . Drug use: Never  . Sexual activity: Not on file  Other Topics Concern  . Not on file  Social History Narrative  . Not on file   Social Determinants of Health   Financial Resource Strain: Not on file  Food Insecurity: Not on file  Transportation Needs: Not on file  Physical Activity: Not on file  Stress: Not on file  Social Connections: Not on file  Intimate Partner Violence: Not on file    Review of Systems  Constitutional: Positive for fatigue.  HENT: Negative.   Eyes: Negative.   Respiratory: Positive for apnea and shortness of breath. Negative for cough.   Cardiovascular: Negative for chest pain.       Chest wall discomfort   Gastrointestinal: Negative.   Endocrine: Negative.   Musculoskeletal: Positive for arthralgias.  Psychiatric/Behavioral: Positive for sleep disturbance.  All other systems reviewed and are negative.   Vitals:   01/04/21 0933  BP: 110/70  Pulse: 65  Temp: (!) 97 F (36.1 C)  SpO2: 100%   Physical Exam Constitutional:      Appearance: She is well-developed. She is obese.     Comments: Overweight  HENT:     Head: Normocephalic and atraumatic.     Mouth/Throat:     Mouth: Mucous membranes are moist.  Neck:     Thyroid: No thyromegaly.  Trachea: No tracheal deviation.  Cardiovascular:     Rate and Rhythm: Normal rate and regular rhythm.  Pulmonary:     Effort: Pulmonary effort is normal. No respiratory distress.     Breath sounds: Normal breath sounds. No wheezing or rales.  Chest:     Chest wall: No tenderness.  Musculoskeletal:     Cervical back: No rigidity or tenderness.  Skin:    General: Skin is warm and dry.  Neurological:     Mental Status: She is alert.     Comments: Uses a walker  Psychiatric:        Mood and Affect: Mood normal.    Data Reviewed: CT of the chest reviewed showing midlung atelectasis-unchanged from previous -Reviewed by myself  Echocardiogram with evidence of severe pulmonary hypertension, diastolic dysfunction -This is likely related to obstructive sleep apnea, chronic hypoxemia, diastolic dysfunction  CPAP compliance shows 70% compliance AHI of 2.2 Machine set between 5 and 15  Assessment:  Obstructive sleep apnea -Encouraged to continue using CPAP on a regular basis -Encouraged weight loss efforts -Hopefully compliance should improve with mask change  Nocturnal desaturations -Encouraged to continue CPAP use on a regular basis  Pulmonary hypertension Multifactorial pulmonary hypertension May be related to nocturnal hypoxemia -Continue CPAP -Oxygen with activity -Follow-up pulmonary hypertension clinic  Coronary artery  disease -Following up with cardiology  Shortness of breath with wheezing -Albuterol does help -Only using albuterol about once a day  Abnormal CT scan of the chest revealing atelectasis -Repeat CT scan of the chest is stable from previous, reviewed by myself  Plan/Recommendations:  Continue using CPAP on a regular basis  Continue follow-up with cardiology  Continue oxygen supplementation  Graded exercise as tolerated  Follow-up in 3 months   Virl Diamond MD [facility] 01/04/2021, 9:36 AM  CC: No ref. provider found

## 2021-01-11 ENCOUNTER — Ambulatory Visit (INDEPENDENT_AMBULATORY_CARE_PROVIDER_SITE_OTHER): Payer: Medicare (Managed Care) | Admitting: Cardiology

## 2021-01-11 ENCOUNTER — Other Ambulatory Visit: Payer: Self-pay

## 2021-01-11 ENCOUNTER — Encounter: Payer: Self-pay | Admitting: Cardiology

## 2021-01-11 VITALS — BP 116/70 | HR 84 | Ht <= 58 in

## 2021-01-11 DIAGNOSIS — I2510 Atherosclerotic heart disease of native coronary artery without angina pectoris: Secondary | ICD-10-CM | POA: Diagnosis not present

## 2021-01-11 DIAGNOSIS — I272 Pulmonary hypertension, unspecified: Secondary | ICD-10-CM

## 2021-01-11 DIAGNOSIS — R0602 Shortness of breath: Secondary | ICD-10-CM

## 2021-01-11 DIAGNOSIS — R600 Localized edema: Secondary | ICD-10-CM | POA: Diagnosis not present

## 2021-01-11 DIAGNOSIS — I10 Essential (primary) hypertension: Secondary | ICD-10-CM

## 2021-01-11 NOTE — Progress Notes (Signed)
Cardiology Office Note:    Date:  01/11/2021   ID:  Jennifer [name], DOB 09-28-1934, MRN [id number]  PCP:  Linda Hedges, NP (Inactive)  Cardiologist:  Buford Dresser, MD PhD  Referring MD: Linda Hedges, NP   CC: follow up  History of Present Illness:    Jennifer [name] is a 87 y.o. female with a hx of CAD with stable angina, HTN, HLD, sleep apnea, GERD with hiatal hernia/esophageal stricture, pulmonary hypertension with chronic hypoxemic respiratory failure who is seen for follow up today. I initially met her 01/02/20 as a new patient to me/transfer from Dr. Saunders Revel at the request of Linda Hedges, NP for the evaluation and management of CAD.  Cardiac history: PCI to LAD in 2009 in [place]. Had repeat cath in 2014 due to accelerating angina, stent patent, jailed diagonal, no intervention. Nuc stress 2018 without ischemic, echo 2018 with normal LV function and RVSP 26 mmHg. She established with Dr. Saunders Revel in 05/2018 after moving from [place]. Nuclear stress done here in 06/2018 showed breast attenuation but no ischemia. Echo 06/2018 showed normal LV function with pHtn (PASP 55). She had her ranexa dose decreased due to dizziness 08/2018. She had palpitations in 09/2018 and had 30 day event monitor ordered, which showed no significant arrhythmias.   Today: Recently seen by Dr. Ander Slade. Feeling a little better now that she is on oxygen. Coughing less. Continues to have shortness of breath with exertion. Has occasional PND. Uses CPAP.   Denies chest pain (rare tightness with coughing). No orthopnea (1 pillow), change in LE edema or unexpected weight gain. No syncope or palpitations.  Past Medical History:  Diagnosis Date  . Ambulates with cane    straight  . At risk for falls   . CAD (coronary artery disease)   . Cataracts, bilateral   . GERD (gastroesophageal reflux disease)   . Hypercholesterolemia   . Hypertension   . Hypothyroidism   . Major depression   . Musculoskeletal back  pain    CHRONIC LEFT-SIDED DISCOMFORT  . Obstructive sleep apnea on CPAP    uses CPAP nightly  . Osteopenia   . PVD (peripheral vascular disease) ([place])   . Stable angina ([facility])   . SVD (spontaneous vaginal delivery)    x 2  . Vitamin D deficiency   . Wears glasses   . Wears partial dentures    upper    Past Surgical History:  Procedure Laterality Date  . ANKLE SURGERY Left   . BACK SURGERY    . CARDIAC CATHETERIZATION  06/2008   PCI to LAD with 3.0 x 12 mm DES  . COLONOSCOPY    . EYE SURGERY     remove cataracts - bilateral  . MULTIPLE TOOTH EXTRACTIONS     upper teeth - upper dentures  . OPEN REDUCTION INTERNAL FIXATION (ORIF) DISTAL RADIAL FRACTURE Left 05/16/2019   Procedure: OPEN REDUCTION INTERNAL FIXATION (ORIF) DISTAL RADIAL FRACTURE;  Surgeon: Charlotte Crumb, MD;  Location: [facility];  Service: Orthopedics;  Laterality: Left;  . SPINAL FUSION    . UPPER GI ENDOSCOPY      Current Medications: Current Outpatient Medications on File Prior to Visit  Medication Sig  . acetaminophen (TYLENOL) 650 MG CR tablet Take 650 mg by mouth 2 (two) times a day.  . albuterol (VENTOLIN HFA) 108 (90 Base) MCG/ACT inhaler Inhale 2 puffs into the lungs every 6 (six) hours as needed for wheezing or shortness of breath.  Marland Kitchen aspirin EC 81 MG tablet Take  81 mg by mouth daily. CARDIAC EVENT PREVENTION  . benzonatate (TESSALON) 200 MG capsule Take 1 capsule (200 mg total) by mouth 3 (three) times daily as needed for cough.  . Cholecalciferol (VITAMIN D3) 5000 units TABS Take 5,000 Units by mouth daily with breakfast.   . enalapril (VASOTEC) 20 MG tablet Take 20 mg by mouth daily.  Marland Kitchen escitalopram (LEXAPRO) 5 MG tablet Take 5 mg by mouth daily.  Marland Kitchen HYDROcodone-homatropine (HYCODAN) 5-1.5 MG/5ML syrup Take 5 mLs by mouth every 6 (six) hours as needed for cough.  . levothyroxine (SYNTHROID, LEVOTHROID) 50 MCG tablet Take 50 mcg by mouth daily before breakfast.  . mirtazapine (REMERON) 7.5 MG tablet  Take 7.5 mg by mouth at bedtime.  . Multiple Vitamins-Minerals (PRESERVISION AREDS 2) CAPS Take 1 tablet by mouth daily.  . nitroGLYCERIN (NITROSTAT) 0.4 MG SL tablet Place 0.4 mg under the tongue every 5 (five) minutes as needed for chest pain.  . pantoprazole (PROTONIX) 20 MG tablet Take 20 mg by mouth daily.  . pravastatin (PRAVACHOL) 80 MG tablet Take 80 mg by mouth every evening.  . ranolazine (RANEXA) 500 MG 12 hr tablet Take 500 mg by mouth 2 (two) times daily.  Marland Kitchen Spacer/Aero-Holding Chambers (AEROCHAMBER MV) inhaler Use as instructed  . traMADol (ULTRAM) 50 MG tablet Take 50 mg by mouth daily as needed for moderate pain.  Marland Kitchen amLODipine (NORVASC) 2.5 MG tablet Take 1 tablet (2.5 mg total) by mouth daily.  Marland Kitchen torsemide (DEMADEX) 20 MG tablet Take 1 tablet (20 mg total) by mouth 2 (two) times daily.   No current facility-administered medications on file prior to visit.     Allergies:   Patient has no known allergies.   Social History   Tobacco Use  . Smoking status: Never Smoker  . Smokeless tobacco: Never Used  Vaping Use  . Vaping Use: Never used  Substance Use Topics  . Alcohol use: Never  . Drug use: Never    Family History: The patient's family history includes Asthma in her father; Heart disease in her mother; Heart disease (age of onset: 80) in her brother.  ROS:   Please see the history of present illness.  Additional pertinent ROS negative except as documented.   EKGs/Labs/Other Studies Reviewed:    The following studies were reviewed today: Echo 09/28/20 1. Left ventricular ejection fraction, by estimation, is 65 to 70%. Left  ventricular ejection fraction by 3D volume is 67 %. The left ventricle has  normal function. The left ventricle has no regional wall motion  abnormalities. Left ventricular diastolic  parameters are consistent with Grade I diastolic dysfunction (impaired  relaxation).  2. Right ventricular systolic function is normal. The right  ventricular  size is normal. There is moderately elevated pulmonary artery systolic  pressure. The estimated right ventricular systolic pressure is 84.1 mmHg.  3. The mitral valve is normal in structure. No evidence of mitral valve  regurgitation. No evidence of mitral stenosis.  4. The aortic valve is normal in structure. Aortic valve regurgitation is  not visualized. No aortic stenosis is present.  5. The inferior vena cava is normal in size with greater than 50%  respiratory variability, suggesting right atrial pressure of 3 mmHg.   Comparison(s): Prior images reviewed side by side. Changes from prior  study are noted. 02/23/20 EF 60-65%. GLS -25.3%. PA pressure 14mmHg.  Monitor 10/21/18  The patient was enrolled for 30 days. 60% of the monitoring period yielded diagnostic tracings.  The predominant rhythm  was sinus with an average rate of 72 bpm (range 51 to 125 bpm).  Isolated PACs and PVCs were noted.  No sustained arrhythmia or prolonged pause was identified.   Predominantly sinus rhythm with isolated PACs and PVCs.  No sustained arrhythmia.  lexiscan 7.11.19  The left ventricular ejection fraction is hyperdynamic (>65%).  Nuclear stress EF: 71%.  There was no ST segment deviation noted during stress.  There is a small defect of mild severity present in the basal anteroseptal, mid anteroseptal and apical septal location. The defect is non-reversible that is consistent with breast attenuation artifact. No ischemia noted.  This is a low risk study.   Echo 06/17/18 - Left ventricle: The cavity size was normal. Wall thickness was   normal. Systolic function was normal. The estimated ejection   fraction was in the range of 60% to 65%. Wall motion was normal;   there were no regional wall motion abnormalities. Left   ventricular diastolic function parameters were normal. - Mitral valve: Calcified annulus. There was mild regurgitation. - Pulmonary arteries: Systolic  pressure was moderately increased.   PA peak pressure: 55 mm Hg (S).  Impressions: - Normal LV systolic function; mild MR; trace TR with moderate   pulmonary hypertension.  EKG:  EKG is personally reviewed.  The ekg ordered 08/10/20 demonstrates normal sinus rhythm at 74 bpm  Recent Labs: 08/10/2020: BNP 43.2 09/10/2020: BUN 26; Creatinine, Ser 1.13; Potassium 5.4; Sodium 140  Recent Lipid Panel    Component Value Date/Time   CHOL 127 09/02/2019 0903   TRIG 57 09/02/2019 0903   HDL 77 09/02/2019 0903   CHOLHDL 1.6 09/02/2019 0903   LDLCALC 37 09/02/2019 0903    Physical Exam:    VS:  BP 116/70 (BP Location: Left Arm, Patient Position: Sitting)   Pulse 84   Ht 4' (1.219 m)   LMP  (LMP Unknown)   SpO2 97%   BMI 62.43 kg/m     Wt Readings from Last 3 Encounters:  01/04/21 204 lb 9.6 oz (92.8 kg)  10/10/20 207 lb (93.9 kg)  09/10/20 215 lb (97.5 kg)    GEN: Well nourished, well developed in no acute distress. On room air today but has O2 nearby. HEENT: Normal, moist mucous membranes NECK: No JVD at 90 degrees CARDIAC: regular rhythm, normal S1 and S2, no rubs or gallops. No murmur. VASCULAR: Radial and DP pulses 2+ bilaterally. No carotid bruits RESPIRATORY:  Distant but clear, fine crackles throughout more R>L and not dependent ABDOMEN: Soft, non-tender, non-distended MUSCULOSKELETAL:  Ambulates independently SKIN: Warm and dry, only trace bilateral LE edema with compression stockings in place NEUROLOGIC:  Alert and oriented x 3. No focal neuro deficits noted. PSYCHIATRIC:  Normal affect   ASSESSMENT:    1. Shortness of breath   2. Pulmonary hypertension, unspecified ([place])   3. Bilateral leg edema   4. Coronary artery disease involving native coronary artery of native heart without angina pectoris   5. Essential hypertension    PLAN:    Dyspnea, LE edema, weight gain:  -echo with hyperdynamic LVEF, only grade 1 diastolic dysfunction, and elevated right sided  pressures -consistent with pulmonary hypertension as cause -improved symptoms with change to torsemide and use of oxygen -continue torsemide BID -follows with pulmonology for sleep apnea, pulmonary hypertension  CAD: denies angina at this time. Tolerating medications well -continue aspirin 81 mg, pravastatin 80 mg, ranolazine 500 mg BID -counseled on red flag warning signs requiring immediate medical attention -has PRN  SL NG, has not required  Hypertension: at goal <130/80 -continue enalapril, low dose amlodipine -continue torsemide as above -had issues with beta blocker in the past  Secondary prevention -recommend heart healthy/Mediterranean diet, with whole grains, fruits, vegetable, fish, lean meats, nuts, and olive oil. Limit salt. -recommend avoidance of tobacco products. Avoid excess alcohol.  Plan for follow up: 6 mos or sooner as needed  Copy of note will be sent to: [facility] [place], [place] [address] Fax [phone]  Medication Adjustments/Labs and Tests Ordered: Current medicines are reviewed at length with the patient today.  Concerns regarding medicines are outlined above.  No orders of the defined types were placed in this encounter.  No orders of the defined types were placed in this encounter.   Patient Instructions  Medication Instructions:  Your Physician recommend you continue on your current medication as directed.    *If you need a refill on your cardiac medications before your next appointment, please call your pharmacy*   Lab Work: None   Testing/Procedures: None   Follow-Up: At [facility], you and your health needs are our priority.  As part of our continuing mission to provide you with exceptional heart care, we have created designated Provider Care Teams.  These Care Teams include your primary Cardiologist (physician) and Advanced Practice Providers (APPs -  Physician Assistants and Nurse Practitioners) who all  work together to provide you with the care you need, when you need it.  We recommend signing up for the patient portal called "MyChart".  Sign up information is provided on this After Visit Summary.  MyChart is used to connect with patients for Virtual Visits (Telemedicine).  Patients are able to view lab/test results, encounter notes, upcoming appointments, etc.  Non-urgent messages can be sent to your provider as well.   To learn more about what you can do with MyChart, go to [email or web address].    Your next appointment:   6 month(s)  The format for your next appointment:   In Person  Provider:   Buford Dresser, MD       Signed, Buford Dresser, MD PhD 01/11/2021   [place]

## 2021-01-11 NOTE — Patient Instructions (Signed)

## 2021-04-11 ENCOUNTER — Encounter: Payer: Self-pay | Admitting: Pulmonary Disease

## 2021-04-11 ENCOUNTER — Other Ambulatory Visit: Payer: Self-pay

## 2021-04-11 ENCOUNTER — Ambulatory Visit (INDEPENDENT_AMBULATORY_CARE_PROVIDER_SITE_OTHER): Payer: Medicare (Managed Care)

## 2021-04-11 ENCOUNTER — Ambulatory Visit (INDEPENDENT_AMBULATORY_CARE_PROVIDER_SITE_OTHER): Payer: Medicare (Managed Care) | Admitting: Pulmonary Disease

## 2021-04-11 VITALS — BP 120/74 | HR 72 | Temp 98.3°F | Ht <= 58 in | Wt 206.6 lb

## 2021-04-11 DIAGNOSIS — R0600 Dyspnea, unspecified: Secondary | ICD-10-CM

## 2021-04-11 DIAGNOSIS — I272 Pulmonary hypertension, unspecified: Secondary | ICD-10-CM

## 2021-04-11 DIAGNOSIS — R0609 Other forms of dyspnea: Secondary | ICD-10-CM

## 2021-04-11 NOTE — Patient Instructions (Addendum)
Obstructive sleep apnea -Compliant with CPAP therapy -Continue using CPAP on a regular basis  Pulmonary hypertension -Continue oxygen -Continue adequate management of obstructive sleep apnea  History of heart disease -continue follow-up with cardiology  Shortness of breath is multifactorial -Continue albuterol as needed  We will get a repeat chest x-ray today  I will see you back in 3 months

## 2021-04-11 NOTE — Progress Notes (Signed)
Jennifer [name]    [id number]    [birth year]  Primary Care Physician:Fernald, Lyla Son, NP (Inactive)  Referring Physician: No referring provider defined for this encounter.  Chief complaint:    Follow-up obstructive sleep apnea She has pulmonary hypertension Multifactorial shortness of breath  HPI:  Cough is improved -Has a history of hiatal hernia, does not appear to have significant reflux at present  Denies any significant chest pain or discomfort at present  She does have multifactorial shortness of breath  She does use a walker for ambulation  History of coronary artery disease History of pulmonary hypertension  Exercise tolerance is less than a block Ambulates with a walker Not wheezing as much Uses oxygen around-the-clock  Has been using CPAP more regularly since the last visit Tolerating CPAP well -Feels CPAP is helping  Usually goes to bed about 11 PM, wakes up about 5:30 in the morning Cough and reflux is much better  Previous history Patient recently moved here from [place], did speak with Dr. Avie Arenas regarding a CT Repeat CT significant for some atelectasis She has a history of moderate pulmonary hypertension-echo did confirm moderate pulmonary hypertension-2019   Outpatient Encounter Medications as of 04/11/2021  Medication Sig  . acetaminophen (TYLENOL) 650 MG CR tablet Take 650 mg by mouth 2 (two) times a day.  . albuterol (VENTOLIN HFA) 108 (90 Base) MCG/ACT inhaler Inhale 2 puffs into the lungs every 6 (six) hours as needed for wheezing or shortness of breath.  Marland Kitchen aspirin EC 81 MG tablet Take 81 mg by mouth daily. CARDIAC EVENT PREVENTION  . benzonatate (TESSALON) 200 MG capsule Take 1 capsule (200 mg total) by mouth 3 (three) times daily as needed for cough.  . Cholecalciferol (VITAMIN D3) 5000 units TABS Take 5,000 Units by mouth daily with breakfast.   . enalapril (VASOTEC) 20 MG tablet Take 20 mg by mouth daily.  Marland Kitchen escitalopram  (LEXAPRO) 5 MG tablet Take 5 mg by mouth daily.  Marland Kitchen HYDROcodone-homatropine (HYCODAN) 5-1.5 MG/5ML syrup Take 5 mLs by mouth every 6 (six) hours as needed for cough.  . levothyroxine (SYNTHROID, LEVOTHROID) 50 MCG tablet Take 50 mcg by mouth daily before breakfast.  . mirtazapine (REMERON) 7.5 MG tablet Take 7.5 mg by mouth at bedtime.  . Multiple Vitamins-Minerals (PRESERVISION AREDS 2) CAPS Take 1 tablet by mouth daily.  . nitroGLYCERIN (NITROSTAT) 0.4 MG SL tablet Place 0.4 mg under the tongue every 5 (five) minutes as needed for chest pain.  . pantoprazole (PROTONIX) 20 MG tablet Take 20 mg by mouth daily.  . pravastatin (PRAVACHOL) 80 MG tablet Take 80 mg by mouth every evening.  . ranolazine (RANEXA) 500 MG 12 hr tablet Take 500 mg by mouth 2 (two) times daily.  Marland Kitchen Spacer/Aero-Holding Chambers (AEROCHAMBER MV) inhaler Use as instructed  . traMADol (ULTRAM) 50 MG tablet Take 50 mg by mouth daily as needed for moderate pain.  Marland Kitchen torsemide (DEMADEX) 20 MG tablet Take 1 tablet (20 mg total) by mouth 2 (two) times daily.  . [DISCONTINUED] amLODipine (NORVASC) 2.5 MG tablet Take 1 tablet (2.5 mg total) by mouth daily.   No facility-administered encounter medications on file as of 04/11/2021.    Allergies as of 04/11/2021  . (No Known Allergies)    Past Medical History:  Diagnosis Date  . Ambulates with cane    straight  . At risk for falls   . CAD (coronary artery disease)   . Cataracts, bilateral   .  GERD (gastroesophageal reflux disease)   . Hypercholesterolemia   . Hypertension   . Hypothyroidism   . Major depression   . Musculoskeletal back pain    CHRONIC LEFT-SIDED DISCOMFORT  . Obstructive sleep apnea on CPAP    uses CPAP nightly  . Osteopenia   . PVD (peripheral vascular disease) ([facility])   . Stable angina ([facility])   . SVD (spontaneous vaginal delivery)    x 2  . Vitamin D deficiency   . Wears glasses   . Wears partial dentures    upper    Past Surgical History:   Procedure Laterality Date  . ANKLE SURGERY Left   . BACK SURGERY    . CARDIAC CATHETERIZATION  06/2008   PCI to LAD with 3.0 x 12 mm DES  . COLONOSCOPY    . EYE SURGERY     remove cataracts - bilateral  . MULTIPLE TOOTH EXTRACTIONS     upper teeth - upper dentures  . OPEN REDUCTION INTERNAL FIXATION (ORIF) DISTAL RADIAL FRACTURE Left 05/16/2019   Procedure: OPEN REDUCTION INTERNAL FIXATION (ORIF) DISTAL RADIAL FRACTURE;  Surgeon: Dairl Ponder, MD;  Location: [facility];  Service: Orthopedics;  Laterality: Left;  . SPINAL FUSION    . UPPER GI ENDOSCOPY      Family History  Problem Relation Age of Onset  . Heart disease Mother   . Asthma Father   . Heart disease Brother 74       CAD    Social History   Socioeconomic History  . Marital status: Widowed    Spouse name: Not on file  . Number of children: 2  . Years of education: Not on file  . Highest education level: Not on file  Occupational History  . Not on file  Tobacco Use  . Smoking status: Never Smoker  . Smokeless tobacco: Never Used  Vaping Use  . Vaping Use: Never used  Substance and Sexual Activity  . Alcohol use: Never  . Drug use: Never  . Sexual activity: Not on file  Other Topics Concern  . Not on file  Social History Narrative  . Not on file   Social Determinants of Health   Financial Resource Strain: Not on file  Food Insecurity: Not on file  Transportation Needs: Not on file  Physical Activity: Not on file  Stress: Not on file  Social Connections: Not on file  Intimate Partner Violence: Not on file    Review of Systems  Constitutional: Positive for fatigue.  HENT: Negative.   Eyes: Negative.   Respiratory: Positive for apnea and shortness of breath. Negative for cough.   Cardiovascular: Negative for chest pain.       Chest wall discomfort  Gastrointestinal: Negative.   Endocrine: Negative.   Musculoskeletal: Positive for arthralgias.  Psychiatric/Behavioral: Positive for sleep  disturbance.  All other systems reviewed and are negative.   Vitals:   04/11/21 1208  BP: 120/74  Pulse: 72  Temp: 98.3 F (36.8 C)  SpO2: 99%   Physical Exam Constitutional:      Appearance: She is well-developed. She is obese.     Comments: Overweight  HENT:     Head: Normocephalic and atraumatic.  Neck:     Thyroid: No thyromegaly.     Trachea: No tracheal deviation.  Cardiovascular:     Rate and Rhythm: Normal rate and regular rhythm.  Pulmonary:     Effort: Pulmonary effort is normal. No respiratory distress.     Breath sounds: Normal  breath sounds. No wheezing or rales.  Chest:     Chest wall: No tenderness.  Musculoskeletal:     Cervical back: No rigidity or tenderness.  Neurological:     Mental Status: She is alert.     Comments: Uses a walker  Psychiatric:        Mood and Affect: Mood normal.    Data Reviewed: CT of the chest reviewed showing midlung atelectasis-unchanged from previous -Reviewed by myself  Echocardiogram with evidence of severe pulmonary hypertension, diastolic dysfunction -This is likely related to obstructive sleep apnea, chronic hypoxemia, diastolic dysfunction  CPAP compliance shows 70% compliance AHI of 2.2 Machine set between 5 and 15  Assessment:  Obstructive sleep apnea -Encouraged to continue using CPAP on a regular basis -Encouraged weight loss efforts  Nocturnal desaturations -Continue CPAP nightly  Pulmonary hypertension Multifactorial pulmonary hypertension -Continue CPAP nightly -Oxygen use with activity  Coronary artery disease -Following up with cardiology  Shortness of breath with wheezing -Albuterol as needed  Abnormal CT scan of the chest revealing atelectasis -Last chest x-ray was a year ago -We will repeat chest x-ray today  Plan/Recommendations:  Encouraged to continue using CPAP on a regular basis  Continue oxygen segmentation  Graded exercise as tolerated  I will follow-up with her in  about 3 months  I spent 30 minutes dedicated to the care of this patient on the date of this encounter to include previsit review of records, face-to-face time with the patient discussing conditions above, post visit ordering of testing, clinical documentation with electronic health record and communicated necessary findings to members of the patient's care team    Virl Diamond MD  Pulmonary and Critical Care 04/11/2021, 12:13 PM  CC: No ref. provider found

## 2021-07-05 ENCOUNTER — Encounter: Payer: Self-pay | Admitting: Pulmonary Disease

## 2021-07-05 ENCOUNTER — Ambulatory Visit (INDEPENDENT_AMBULATORY_CARE_PROVIDER_SITE_OTHER): Payer: Medicare (Managed Care) | Admitting: Pulmonary Disease

## 2021-07-05 ENCOUNTER — Other Ambulatory Visit: Payer: Self-pay

## 2021-07-05 VITALS — BP 126/64 | HR 74 | Temp 97.5°F | Ht <= 58 in | Wt 207.0 lb

## 2021-07-05 DIAGNOSIS — G4733 Obstructive sleep apnea (adult) (pediatric): Secondary | ICD-10-CM

## 2021-07-05 DIAGNOSIS — Z9989 Dependence on other enabling machines and devices: Secondary | ICD-10-CM | POA: Diagnosis not present

## 2021-07-05 MED ORDER — FLUTICASONE FUROATE-VILANTEROL 100-25 MCG/INH IN AEPB: 1.0000 | INHALATION_SPRAY | Freq: Every day | RESPIRATORY_TRACT | 1 refills | Status: DC

## 2021-07-05 NOTE — Progress Notes (Signed)
Jennifer [name]    [id number]    [birth year]  Primary Care Physician:Fernald, Lyla Son, NP (Inactive)  Referring Physician: No referring provider defined for this encounter.  Chief complaint:    Follow-up obstructive sleep apnea She has pulmonary hypertension Multifactorial shortness of breath  HPI:  Accompanied by 2 of her daughters today  Cough is improved -This is related to hiatal hernia, significant history of the reflux for which she is on Protonix  Limited with activities of daily living Does require a walker for stability, exercise tolerance less than about a block -Limited with fatigue and stability issues  Continues to be compliant with CPAP use for obstructive sleep apnea Denies any chest pains or chest discomfort Has morning wheezes  History of coronary artery disease History of pulmonary hypertension  Exercise tolerance is less than a block Ambulates with a walker Not wheezing as much Uses oxygen around-the-clock  Has been using CPAP more regularly since the last visit -Continues to feel CPAP is helping Usually goes to bed about 11 PM, wakes up about 5:30 in the morning Cough and reflux is much better  Previous history Patient recently moved here from [place], did speak with Dr. Avie Arenas regarding a CT Repeat CT significant for some atelectasis She has a history of moderate pulmonary hypertension-echo did confirm moderate pulmonary hypertension-2019   Outpatient Encounter Medications as of 07/05/2021  Medication Sig   acetaminophen (TYLENOL) 650 MG CR tablet Take 650 mg by mouth 2 (two) times a day.   albuterol (VENTOLIN HFA) 108 (90 Base) MCG/ACT inhaler Inhale 2 puffs into the lungs every 6 (six) hours as needed for wheezing or shortness of breath.   aspirin EC 81 MG tablet Take 81 mg by mouth daily. CARDIAC EVENT PREVENTION   benzonatate (TESSALON) 200 MG capsule Take 1 capsule (200 mg total) by mouth 3 (three) times daily as needed for  cough.   Cholecalciferol (VITAMIN D3) 5000 units TABS Take 5,000 Units by mouth daily with breakfast.    enalapril (VASOTEC) 20 MG tablet Take 20 mg by mouth daily.   escitalopram (LEXAPRO) 5 MG tablet Take 5 mg by mouth daily.   ibuprofen (ADVIL) 200 MG tablet Take 600 mg by mouth daily as needed.   levothyroxine (SYNTHROID, LEVOTHROID) 50 MCG tablet Take 50 mcg by mouth daily before breakfast.   mirtazapine (REMERON) 7.5 MG tablet Take 7.5 mg by mouth at bedtime.   Multiple Vitamins-Minerals (PRESERVISION AREDS 2) CAPS Take 1 tablet by mouth daily.   nitroGLYCERIN (NITROSTAT) 0.4 MG SL tablet Place 0.4 mg under the tongue every 5 (five) minutes as needed for chest pain.   pantoprazole (PROTONIX) 20 MG tablet Take 20 mg by mouth daily.   pravastatin (PRAVACHOL) 80 MG tablet Take 80 mg by mouth every evening.   ranolazine (RANEXA) 500 MG 12 hr tablet Take 500 mg by mouth 2 (two) times daily.   traMADol (ULTRAM) 50 MG tablet Take 50 mg by mouth daily as needed for moderate pain.   Spacer/Aero-Holding Chambers (AEROCHAMBER MV) inhaler Use as instructed (Patient not taking: Reported on 07/05/2021)   torsemide (DEMADEX) 20 MG tablet Take 1 tablet (20 mg total) by mouth 2 (two) times daily.   [DISCONTINUED] HYDROcodone-homatropine (HYCODAN) 5-1.5 MG/5ML syrup Take 5 mLs by mouth every 6 (six) hours as needed for cough. (Patient not taking: Reported on 07/05/2021)   No facility-administered encounter medications on file as of 07/05/2021.    Allergies as of 07/05/2021   (  No Known Allergies)    Past Medical History:  Diagnosis Date   Ambulates with cane    straight   At risk for falls    CAD (coronary artery disease)    Cataracts, bilateral    GERD (gastroesophageal reflux disease)    Hypercholesterolemia    Hypertension    Hypothyroidism    Major depression    Musculoskeletal back pain    CHRONIC LEFT-SIDED DISCOMFORT   Obstructive sleep apnea on CPAP    uses CPAP nightly   Osteopenia     PVD (peripheral vascular disease) (HCC)    Stable angina (HCC)    SVD (spontaneous vaginal delivery)    x 2   Vitamin D deficiency    Wears glasses    Wears partial dentures    upper    Past Surgical History:  Procedure Laterality Date   ANKLE SURGERY Left    BACK SURGERY     CARDIAC CATHETERIZATION  06/2008   PCI to LAD with 3.0 x 12 mm DES   COLONOSCOPY     EYE SURGERY     remove cataracts - bilateral   MULTIPLE TOOTH EXTRACTIONS     upper teeth - upper dentures   OPEN REDUCTION INTERNAL FIXATION (ORIF) DISTAL RADIAL FRACTURE Left 05/16/2019   Procedure: OPEN REDUCTION INTERNAL FIXATION (ORIF) DISTAL RADIAL FRACTURE;  Surgeon: Dairl Ponder, MD;  Location: [facility];  Service: Orthopedics;  Laterality: Left;   SPINAL FUSION     UPPER GI ENDOSCOPY      Family History  Problem Relation Age of Onset   Heart disease Mother    Asthma Father    Heart disease Brother 89       CAD    Social History   Socioeconomic History   Marital status: Widowed    Spouse name: Not on file   Number of children: 2   Years of education: Not on file   Highest education level: Not on file  Occupational History   Not on file  Tobacco Use   Smoking status: Never   Smokeless tobacco: Never  Vaping Use   Vaping Use: Never used  Substance and Sexual Activity   Alcohol use: Never   Drug use: Never   Sexual activity: Not on file  Other Topics Concern   Not on file  Social History Narrative   Not on file   Social Determinants of Health   Financial Resource Strain: Not on file  Food Insecurity: Not on file  Transportation Needs: Not on file  Physical Activity: Not on file  Stress: Not on file  Social Connections: Not on file  Intimate Partner Violence: Not on file    Review of Systems  Constitutional:  Positive for fatigue.  HENT: Negative.    Eyes: Negative.   Respiratory:  Positive for apnea, shortness of breath and wheezing. Negative for cough.   Cardiovascular:  Negative  for chest pain.       Chest wall discomfort  Gastrointestinal: Negative.   Endocrine: Negative.   Musculoskeletal:  Positive for arthralgias.  Psychiatric/Behavioral:  Positive for sleep disturbance.   All other systems reviewed and are negative.  Vitals:   07/05/21 1125  BP: 126/64  Pulse: 74  Temp: (!) 97.5 F (36.4 C)  SpO2: 99%   Physical Exam Constitutional:      Appearance: She is well-developed. She is obese.     Comments: Overweight  HENT:     Head: Normocephalic and atraumatic.  Mouth/Throat:     Mouth: Mucous membranes are moist.  Neck:     Thyroid: No thyromegaly.     Trachea: No tracheal deviation.  Cardiovascular:     Rate and Rhythm: Normal rate and regular rhythm.  Pulmonary:     Effort: Pulmonary effort is normal. No respiratory distress.     Breath sounds: Normal breath sounds. No wheezing or rales.  Chest:     Chest wall: No tenderness.  Musculoskeletal:     Cervical back: No rigidity or tenderness.  Neurological:     Mental Status: She is alert.     Comments: Uses a walker  Psychiatric:        Mood and Affect: Mood normal.   Data Reviewed: CT of the chest reviewed showing midlung atelectasis-unchanged from previous -Reviewed by myself  Echocardiogram with evidence of severe pulmonary hypertension, diastolic dysfunction -This is likely related to obstructive sleep apnea, chronic hypoxemia, diastolic dysfunction  CPAP compliance is improving Machine set between 5 and 15  Assessment:  Obstructive sleep apnea .  Encouraged to continue using CPAP on a regular basis .  With her symptoms as tolerated  Nocturnal desaturations -Continue CPAP nightly  Pulmonary hypertension .  Multifactorial .  Related to chronic hypoxemia  .  related to obstructive sleep apnea .  Coronary artery disease -Following up with cardiology  Shortness of breath with wheezing -Albuterol as needed -Will add Breo  Abnormal CT scan of the chest revealing  atelectasis -Last chest x-ray was a year ago -We will repeat chest x-ray today  Plan/Recommendations:  Encouraged to continue using CPAP on a regular basis  Continue oxygen segmentation  Breo to be used daily  Albuterol to be used as needed -Graded exercise as tolerated   Virl Diamond MD [facility] 07/05/2021, 11:35 AM  CC: No ref. provider found

## 2021-07-05 NOTE — Patient Instructions (Signed)
Regular exercises as tolerated  Will add Breo-this should help with wheezing -Breo to be used once daily  Your CPAP seems to be working well -Continue using CPAP on a nightly basis  Continue efforts to reduce reflux  Call with significant concerns  I will see you in 3 months

## 2021-07-12 ENCOUNTER — Other Ambulatory Visit: Payer: Self-pay

## 2021-07-12 ENCOUNTER — Ambulatory Visit (INDEPENDENT_AMBULATORY_CARE_PROVIDER_SITE_OTHER): Payer: Medicare (Managed Care) | Admitting: Cardiology

## 2021-07-12 ENCOUNTER — Encounter (HOSPITAL_BASED_OUTPATIENT_CLINIC_OR_DEPARTMENT_OTHER): Payer: Self-pay | Admitting: Cardiology

## 2021-07-12 VITALS — BP 126/55 | HR 62 | Ht <= 58 in | Wt 206.4 lb

## 2021-07-12 DIAGNOSIS — I2510 Atherosclerotic heart disease of native coronary artery without angina pectoris: Secondary | ICD-10-CM

## 2021-07-12 DIAGNOSIS — I10 Essential (primary) hypertension: Secondary | ICD-10-CM | POA: Diagnosis not present

## 2021-07-12 DIAGNOSIS — I272 Pulmonary hypertension, unspecified: Secondary | ICD-10-CM | POA: Diagnosis not present

## 2021-07-12 DIAGNOSIS — Z9981 Dependence on supplemental oxygen: Secondary | ICD-10-CM

## 2021-07-12 DIAGNOSIS — E782 Mixed hyperlipidemia: Secondary | ICD-10-CM

## 2021-07-12 DIAGNOSIS — J9611 Chronic respiratory failure with hypoxia: Secondary | ICD-10-CM | POA: Insufficient documentation

## 2021-07-12 NOTE — Progress Notes (Signed)
Cardiology Office Note:    Date:  07/12/2021   ID:  Jennifer [name], DOB 07-Apr-1934, MRN [id number]  [facility]P:  [facility]  Cardiologist:  Buford Dresser, MD PhD  Referring MD: No ref. provider found   CC: follow up  History of Present Illness:    Jennifer [name] is a 87 y.o. female with a hx of CAD with stable angina, HTN, HLD, sleep apnea, GERD with hiatal hernia/esophageal stricture, pulmonary hypertension with chronic hypoxemic respiratory failure who is seen for follow up today. I initially met her 01/02/20 as a new patient to me/transfer from Dr. Saunders Revel.  Cardiac history: PCI to LAD in 2009 in [place]. Had repeat cath in 2014 due to accelerating angina, stent patent, jailed diagonal, no intervention. Nuc stress 2018 without ischemic, echo 2018 with normal LV function and RVSP 26 mmHg. She established with Dr. Saunders Revel in 05/2018 after moving from [place]. Nuclear stress done here in 06/2018 showed breast attenuation but no ischemia. Echo 06/2018 showed normal LV function with pHtn (PASP 55). She had her ranexa dose decreased due to dizziness 08/2018. She had palpitations in 09/2018 and had 30 day event monitor ordered, which showed no significant arrhythmias.   Today: Has seen Dr. Ander Slade, most recent 07/05/21. Doing well on O2, CPAP, and inhalers. Continues to have shortness of breath, worse with exertion, but largely unchanged. LE edema unchanged. In good spirits today.  Denies chest pain. No PND, orthopnea, change in LE edema or unexpected weight gain. No syncope or palpitations.   Past Medical History:  Diagnosis Date   Ambulates with cane    straight   At risk for falls    CAD (coronary artery disease)    Cataracts, bilateral    GERD (gastroesophageal reflux disease)    Hypercholesterolemia    Hypertension    Hypothyroidism    Major depression    Musculoskeletal back pain    CHRONIC LEFT-SIDED DISCOMFORT   Obstructive sleep apnea on CPAP    uses  CPAP nightly   Osteopenia    PVD (peripheral vascular disease) (HCC)    Stable angina (HCC)    SVD (spontaneous vaginal delivery)    x 2   Vitamin D deficiency    Wears glasses    Wears partial dentures    upper    Past Surgical History:  Procedure Laterality Date   ANKLE SURGERY Left    BACK SURGERY     CARDIAC CATHETERIZATION  06/2008   PCI to LAD with 3.0 x 12 mm DES   COLONOSCOPY     EYE SURGERY     remove cataracts - bilateral   MULTIPLE TOOTH EXTRACTIONS     upper teeth - upper dentures   OPEN REDUCTION INTERNAL FIXATION (ORIF) DISTAL RADIAL FRACTURE Left 05/16/2019   Procedure: OPEN REDUCTION INTERNAL FIXATION (ORIF) DISTAL RADIAL FRACTURE;  Surgeon: Charlotte Crumb, MD;  Location: [facility];  Service: Orthopedics;  Laterality: Left;   SPINAL FUSION     UPPER GI ENDOSCOPY      Current Medications: Current Outpatient Medications on File Prior to Visit  Medication Sig   acetaminophen (TYLENOL) 650 MG CR tablet Take 650 mg by mouth 2 (two) times a day.   albuterol (VENTOLIN HFA) 108 (90 Base) MCG/ACT inhaler Inhale 2 puffs into the lungs every 6 (six) hours as needed for wheezing or shortness of breath.   aspirin EC 81 MG tablet Take 81 mg by mouth daily. CARDIAC EVENT PREVENTION   benzonatate (TESSALON)  200 MG capsule Take 1 capsule (200 mg total) by mouth 3 (three) times daily as needed for cough.   Cholecalciferol (VITAMIN D3) 5000 units TABS Take 5,000 Units by mouth daily with breakfast.    enalapril (VASOTEC) 20 MG tablet Take 20 mg by mouth daily.   escitalopram (LEXAPRO) 5 MG tablet Take 5 mg by mouth daily.   fluticasone furoate-vilanterol (BREO ELLIPTA) 100-25 MCG/INH AEPB Inhale 1 puff into the lungs daily.   ibuprofen (ADVIL) 200 MG tablet Take 600 mg by mouth daily as needed.   levothyroxine (SYNTHROID, LEVOTHROID) 50 MCG tablet Take 50 mcg by mouth daily before breakfast.   mirtazapine (REMERON) 7.5 MG tablet Take 7.5 mg by mouth at bedtime.   Multiple  Vitamins-Minerals (PRESERVISION AREDS 2) CAPS Take 1 tablet by mouth daily.   nitroGLYCERIN (NITROSTAT) 0.4 MG SL tablet Place 0.4 mg under the tongue every 5 (five) minutes as needed for chest pain.   pantoprazole (PROTONIX) 20 MG tablet Take 20 mg by mouth daily.   pravastatin (PRAVACHOL) 80 MG tablet Take 80 mg by mouth every evening.   ranolazine (RANEXA) 500 MG 12 hr tablet Take 500 mg by mouth 2 (two) times daily.   Spacer/Aero-Holding Chambers (AEROCHAMBER MV) inhaler Use as instructed   traMADol (ULTRAM) 50 MG tablet Take 50 mg by mouth daily as needed for moderate pain.   torsemide (DEMADEX) 20 MG tablet Take 1 tablet (20 mg total) by mouth 2 (two) times daily.   No current facility-administered medications on file prior to visit.     Allergies:   Patient has no known allergies.   Social History   Tobacco Use   Smoking status: Never   Smokeless tobacco: Never  Vaping Use   Vaping Use: Never used  Substance Use Topics   Alcohol use: Never   Drug use: Never    Family History: The patient's family history includes Asthma in her father; Heart disease in her mother; Heart disease (age of onset: 74) in her brother.  ROS:   Please see the history of present illness.  Additional pertinent ROS negative except as documented.   EKGs/Labs/Other Studies Reviewed:    The following studies were reviewed today: Echo 09/28/20  1. Left ventricular ejection fraction, by estimation, is 65 to 70%. Left  ventricular ejection fraction by 3D volume is 67 %. The left ventricle has  normal function. The left ventricle has no regional wall motion  abnormalities. Left ventricular diastolic   parameters are consistent with Grade I diastolic dysfunction (impaired  relaxation).   2. Right ventricular systolic function is normal. The right ventricular  size is normal. There is moderately elevated pulmonary artery systolic  pressure. The estimated right ventricular systolic pressure is 86.7  mmHg.   3. The mitral valve is normal in structure. No evidence of mitral valve  regurgitation. No evidence of mitral stenosis.   4. The aortic valve is normal in structure. Aortic valve regurgitation is  not visualized. No aortic stenosis is present.   5. The inferior vena cava is normal in size with greater than 50%  respiratory variability, suggesting right atrial pressure of 3 mmHg.   Comparison(s): Prior images reviewed side by side. Changes from prior  study are noted. 02/23/20 EF 60-65%. GLS -25.3%. PA pressure 44mmHg.  Monitor 10/21/18 The patient was enrolled for 30 days. 60% of the monitoring period yielded diagnostic tracings. The predominant rhythm was sinus with an average rate of 72 bpm (range 51 to 125 bpm). Isolated  PACs and PVCs were noted. No sustained arrhythmia or prolonged pause was identified.   Predominantly sinus rhythm with isolated PACs and PVCs.  No sustained arrhythmia.  lexiscan 7.11.19 The left ventricular ejection fraction is hyperdynamic (>65%). Nuclear stress EF: 71%. There was no ST segment deviation noted during stress. There is a small defect of mild severity present in the basal anteroseptal, mid anteroseptal and apical septal location. The defect is non-reversible that is consistent with breast attenuation artifact. No ischemia noted. This is a low risk study.   Echo 06/17/18 - Left ventricle: The cavity size was normal. Wall thickness was   normal. Systolic function was normal. The estimated ejection   fraction was in the range of 60% to 65%. Wall motion was normal;   there were no regional wall motion abnormalities. Left   ventricular diastolic function parameters were normal. - Mitral valve: Calcified annulus. There was mild regurgitation. - Pulmonary arteries: Systolic pressure was moderately increased.   PA peak pressure: 55 mm Hg (S).   Impressions:  - Normal LV systolic function; mild MR; trace TR with moderate   pulmonary  hypertension.  EKG:  EKG is personally reviewed. 07/12/21: NSR at 62 bpm 08/10/20: normal sinus rhythm at 74 bpm  Recent Labs: 08/10/2020: BNP 43.2 09/10/2020: BUN 26; Creatinine, Ser 1.13; Potassium 5.4; Sodium 140  Recent Lipid Panel    Component Value Date/Time   CHOL 127 09/02/2019 0903   TRIG 57 09/02/2019 0903   HDL 77 09/02/2019 0903   CHOLHDL 1.6 09/02/2019 0903   LDLCALC 37 09/02/2019 0903    Physical Exam:    VS:  BP (!) 126/55   Pulse 62   Ht $R'4\' 10"'WO$  (1.473 m)   Wt 206 lb 6.4 oz (93.6 kg)   LMP  (LMP Unknown)   SpO2 96%   BMI 43.14 kg/m     Wt Readings from Last 3 Encounters:  07/12/21 206 lb 6.4 oz (93.6 kg)  07/05/21 207 lb (93.9 kg)  04/11/21 206 lb 9.6 oz (93.7 kg)    GEN: Well nourished, well developed in no acute distress. On O2 by nasal cannula HEENT: Normal, moist mucous membranes NECK: No JVD at 90 degrees CARDIAC: regular rhythm, normal S1 and S2, no rubs or gallops. No murmur. VASCULAR: Radial and DP pulses 2+ bilaterally. No carotid bruits RESPIRATORY:  Distant but largely clear, fine crackles at bilateral bases R>L ABDOMEN: Soft, non-tender, non-distended MUSCULOSKELETAL:  Ambulates independently with rollator SKIN: Warm and dry, trace bilateral LE edema with compression stockings in place NEUROLOGIC:  Alert and oriented x 3. No focal neuro deficits noted. PSYCHIATRIC:  Normal affect    ASSESSMENT:    1. Pulmonary hypertension, unspecified ([place])   2. Coronary artery disease involving native coronary artery of native heart without angina pectoris   3. Essential hypertension   4. Mixed hyperlipidemia   5. Chronic respiratory failure with hypoxia, on home O2 therapy (HCC)     PLAN:    Pulmonary hypertension Chronic hypoxemic respiratory failure Chronic dyspnea on exertion, LE edema. -echo with hyperdynamic LVEF, only grade 1 diastolic dysfunction, and elevated right sided pressures -consistent with pulmonary hypertension as cause -on chronic  home O2, follows with Dr. Ander Slade for sleep apnea and pulmonary hypertension -continue torsemide BID  CAD: denies angina at this time. Tolerating medications well -continue aspirin 81 mg, pravastatin 80 mg, ranolazine 500 mg BID -counseled on red flag warning signs requiring immediate medical attention -has PRN SL NG, has not required  Hypertension:  at goal <130/80 -continue enalapril -tolerated low dose amlodipine in the past if needed in the future -continue torsemide as above -had issues with beta blocker in the past  Secondary prevention -recommend heart healthy/Mediterranean diet, with whole grains, fruits, vegetable, fish, lean meats, nuts, and olive oil. Limit salt. -recommend avoidance of tobacco products. Avoid excess alcohol.  Plan for follow up: 6 mos or sooner as needed  Copy of note will be sent to: [facility] [place], [place] [phone] Fax [phone]  Medication Adjustments/Labs and Tests Ordered: Current medicines are reviewed at length with the patient today.  Concerns regarding medicines are outlined above.  Orders Placed This Encounter  Procedures   EKG 12-Lead    No orders of the defined types were placed in this encounter.   Patient Instructions  Medication Instructions:  Your Physician recommend you continue on your current medication as directed.    *If you need a refill on your cardiac medications before your next appointment, please call your pharmacy*   Lab Work: None ordered today   Testing/Procedures: None ordered today   Follow-Up: At [facility], you and your health needs are our priority.  As part of our continuing mission to provide you with exceptional heart care, we have created designated Provider Care Teams.  These Care Teams include your primary Cardiologist (physician) and Advanced Practice Providers (APPs -  Physician Assistants and Nurse Practitioners) who all work together to provide you with the  care you need, when you need it.  We recommend signing up for the patient portal called "MyChart".  Sign up information is provided on this After Visit Summary.  MyChart is used to connect with patients for Virtual Visits (Telemedicine).  Patients are able to view lab/test results, encounter notes, upcoming appointments, etc.  Non-urgent messages can be sent to your provider as well.   To learn more about what you can do with MyChart, go to [email or web address].    Your next appointment:   6 month(s)  The format for your next appointment:   In Person  Provider:   Buford Dresser, MD      Signed, Buford Dresser, MD PhD 07/12/2021   [facility]

## 2021-07-12 NOTE — Patient Instructions (Signed)

## 2021-09-09 ENCOUNTER — Ambulatory Visit: Payer: Medicare (Managed Care) | Admitting: Pulmonary Disease

## 2021-12-31 ENCOUNTER — Other Ambulatory Visit: Payer: Self-pay | Admitting: Vascular Surgery

## 2021-12-31 ENCOUNTER — Ambulatory Visit
Admission: RE | Admit: 2021-12-31 | Discharge: 2021-12-31 | Disposition: A | Discharge status: Home / Self Care | Payer: Medicare (Managed Care) | Source: Ambulatory Visit | Attending: Vascular Surgery | Admitting: Vascular Surgery

## 2021-12-31 DIAGNOSIS — M79671 Pain in right foot: Secondary | ICD-10-CM

## 2022-01-14 ENCOUNTER — Ambulatory Visit (HOSPITAL_BASED_OUTPATIENT_CLINIC_OR_DEPARTMENT_OTHER): Payer: Medicare (Managed Care) | Admitting: Cardiology

## 2022-04-10 ENCOUNTER — Encounter: Payer: Self-pay | Admitting: Pulmonary Disease

## 2022-04-10 ENCOUNTER — Ambulatory Visit (INDEPENDENT_AMBULATORY_CARE_PROVIDER_SITE_OTHER): Payer: Medicare (Managed Care) | Admitting: Pulmonary Disease

## 2022-04-10 VITALS — BP 122/74 | HR 61 | Ht <= 58 in | Wt 201.4 lb

## 2022-04-10 DIAGNOSIS — R0609 Other forms of dyspnea: Secondary | ICD-10-CM | POA: Diagnosis not present

## 2022-04-10 NOTE — Progress Notes (Signed)
? ?      Jennifer [name]    [id number]    07-Sep-1934 ? ?Primary Care Physician:[facility] ? ?Referring Physician: [facility], [place] ?[place],  [place] [address] ? ?Chief complaint:    ?Follow-up obstructive sleep apnea ?Has underlying pulmonary hypertension, multifactorial shortness of breath ? ?HPI: ? ?She has been doing relatively well ? ?Occasional cough ?Has a history of large hiatal hernia ? ?Has been trying to use CPAP on a nightly basis ?Cough is improved ? ?She is limited with activities of daily living does require a walker for stability ?Cough is improved ?- ? ?Does require a walker for stability, exercise tolerance less than about a block ?-Limited with fatigue and stability issues ? ?Continues to be compliant with CPAP use for obstructive sleep apnea ?Denies any chest pains or chest discomfort ?Has morning wheezes ? ?History of coronary artery disease ?History of pulmonary hypertension ? ?Exercise tolerance is less than a block ?Ambulates with a walker ?Not wheezing as much ?Uses oxygen around-the-clock ? ?Has been using CPAP more regularly since the last visit ?-Continues to feel CPAP is helping ?Usually goes to bed about 11 PM, wakes up about 5:30 in the morning ?Cough and reflux is much better ? ?Previous history ?Patient recently moved here from [place], did speak with Dr. Gennette Pac regarding a CT ?Repeat CT significant for some atelectasis ?She has a history of moderate pulmonary hypertension-echo did confirm moderate pulmonary hypertension-2019 ? ? ?Outpatient Encounter Medications as of 04/10/2022  ?Medication Sig  ? acetaminophen (TYLENOL) 650 MG CR tablet Take 650 mg by mouth 2 (two) times a day.  ? albuterol (VENTOLIN HFA) 108 (90 Base) MCG/ACT inhaler Inhale 2 puffs into the lungs every 6 (six) hours as needed for wheezing or shortness of breath.  ? aspirin EC 81 MG tablet Take 81 mg by mouth daily. CARDIAC EVENT  PREVENTION  ? benzonatate (TESSALON) 200 MG capsule Take 1 capsule (200 mg total) by mouth 3 (three) times daily as needed for cough.  ? Cholecalciferol (VITAMIN D3) 5000 units TABS Take 5,000 Units by mouth daily with breakfast.   ? enalapril (VASOTEC) 20 MG tablet Take 20 mg by mouth daily.  ? escitalopram (LEXAPRO) 5 MG tablet Take 5 mg by mouth daily.  ? fluticasone furoate-vilanterol (BREO ELLIPTA) 100-25 MCG/INH AEPB Inhale 1 puff into the lungs daily.  ? ibuprofen (ADVIL) 200 MG tablet Take 600 mg by mouth daily as needed.  ? levothyroxine (SYNTHROID, LEVOTHROID) 50 MCG tablet Take 50 mcg by mouth daily before breakfast.  ? mirtazapine (REMERON) 7.5 MG tablet Take 7.5 mg by mouth at bedtime.  ? Multiple Vitamins-Minerals (PRESERVISION AREDS 2) CAPS Take 1 tablet by mouth daily.  ? nitroGLYCERIN (NITROSTAT) 0.4 MG SL tablet Place 0.4 mg under the tongue every 5 (five) minutes as needed for chest pain.  ? pantoprazole (PROTONIX) 20 MG tablet Take 20 mg by mouth daily.  ? pravastatin (PRAVACHOL) 80 MG tablet Take 80 mg by mouth every evening.  ? ranolazine (RANEXA) 500 MG 12 hr tablet Take 500 mg by mouth 2 (two) times daily.  ? Spacer/Aero-Holding Chambers (AEROCHAMBER MV) inhaler Use as instructed  ? traMADol (ULTRAM) 50 MG tablet Take 50 mg by mouth daily as needed for moderate pain.  ? torsemide (DEMADEX) 20 MG tablet Take 1 tablet (20 mg total) by mouth 2 (two) times daily.  ? ?No facility-administered encounter medications on file as of 04/10/2022.  ? ? ?  Allergies as of 04/10/2022  ? (No Known Allergies)  ? ? ?Past Medical History:  ?Diagnosis Date  ? Ambulates with cane   ? straight  ? At risk for falls   ? CAD (coronary artery disease)   ? Cataracts, bilateral   ? GERD (gastroesophageal reflux disease)   ? Hypercholesterolemia   ? Hypertension   ? Hypothyroidism   ? Major depression   ? Musculoskeletal back pain   ? CHRONIC LEFT-SIDED DISCOMFORT  ? Obstructive sleep apnea on CPAP   ? uses CPAP nightly  ?  Osteopenia   ? PVD (peripheral vascular disease) ([place])   ? Stable angina (HCC)   ? SVD (spontaneous vaginal delivery)   ? x 2  ? Vitamin D deficiency   ? Wears glasses   ? Wears partial dentures   ? upper  ? ? ?Past Surgical History:  ?Procedure Laterality Date  ? ANKLE SURGERY Left   ? BACK SURGERY    ? CARDIAC CATHETERIZATION  06/2008  ? PCI to LAD with 3.0 x 12 mm DES  ? COLONOSCOPY    ? EYE SURGERY    ? remove cataracts - bilateral  ? MULTIPLE TOOTH EXTRACTIONS    ? upper teeth - upper dentures  ? OPEN REDUCTION INTERNAL FIXATION (ORIF) DISTAL RADIAL FRACTURE Left 05/16/2019  ? Procedure: OPEN REDUCTION INTERNAL FIXATION (ORIF) DISTAL RADIAL FRACTURE;  Surgeon: Charlotte Crumb, MD;  Location: [facility];  Service: Orthopedics;  Laterality: Left;  ? SPINAL FUSION    ? UPPER GI ENDOSCOPY    ? ? ?Family History  ?Problem Relation Age of Onset  ? Heart disease Mother   ? Asthma Father   ? Heart disease Brother 27  ?     CAD  ? ? ?Social History  ? ?Socioeconomic History  ? Marital status: Widowed  ?  Spouse name: Not on file  ? Number of children: 2  ? Years of education: Not on file  ? Highest education level: Not on file  ?Occupational History  ? Not on file  ?Tobacco Use  ? Smoking status: Never  ? Smokeless tobacco: Never  ?Vaping Use  ? Vaping Use: Never used  ?Substance and Sexual Activity  ? Alcohol use: Never  ? Drug use: Never  ? Sexual activity: Not on file  ?Other Topics Concern  ? Not on file  ?Social History Narrative  ? Not on file  ? ?Social Determinants of Health  ? ?Financial Resource Strain: Not on file  ?Food Insecurity: Not on file  ?Transportation Needs: Not on file  ?Physical Activity: Not on file  ?Stress: Not on file  ?Social Connections: Not on file  ?Intimate Partner Violence: Not on file  ? ? ?Review of Systems  ?Constitutional:  Positive for fatigue.  ?HENT: Negative.    ?Eyes: Negative.   ?Respiratory:  Positive for apnea, shortness of breath and wheezing. Negative for cough.    ?Cardiovascular:  Negative for chest pain.  ?     Chest wall discomfort  ?Gastrointestinal: Negative.   ?Endocrine: Negative.   ?Musculoskeletal:  Positive for arthralgias.  ?Psychiatric/Behavioral:  Positive for sleep disturbance.   ?All other systems reviewed and are negative. ? ?Vitals:  ? 04/10/22 1200  ?BP: 122/74  ?Pulse: 61  ?SpO2: 100%  ? ?Physical Exam ?Constitutional:   ?   Appearance: She is well-developed. She is obese.  ?   Comments: Overweight  ?HENT:  ?   Head: Normocephalic and atraumatic.  ?   Mouth/Throat:  ?  Mouth: Mucous membranes are moist.  ?Neck:  ?   Thyroid: No thyromegaly.  ?   Trachea: No tracheal deviation.  ?Cardiovascular:  ?   Rate and Rhythm: Normal rate and regular rhythm.  ?Pulmonary:  ?   Effort: Pulmonary effort is normal. No respiratory distress.  ?   Breath sounds: Normal breath sounds. No wheezing or rales.  ?Chest:  ?   Chest wall: No tenderness.  ?Musculoskeletal:  ?   Cervical back: No rigidity or tenderness.  ?Neurological:  ?   Mental Status: She is alert.  ?   Comments: Uses a walker  ?Psychiatric:     ?   Mood and Affect: Mood normal.  ? ?Data Reviewed: ?CT of the chest reviewed showing midlung atelectasis-unchanged from previous ?-Reviewed by myself ? ?Echocardiogram with evidence of severe pulmonary hypertension, diastolic dysfunction ?-This is likely related to obstructive sleep apnea, chronic hypoxemia, diastolic dysfunction ? ?CPAP compliance is excellent with 100% use ?Settings of 5-15 ?Residual AHI of 4.6 ?95 percentile pressure of 10.6 ? ?Assessment:  ? ?.  Obstructive sleep apnea ?-Encouraged to continue using CPAP on a nightly basis ?-6 to 8 hours of sleep encouraged ? ?.  Nocturnal desaturations ?-Continue CPAP nightly ? ?.  History of pulmonary hypertension ?-Multifactorial ?-Likely related to chronic hypoxemia ?-Related to obstructive sleep apnea, compliant with CPAP ? ?Marland Kitchen  Coronary artery disease ?-Continues to follow-up with cardiology ? ?Recent  pneumonia ?-Completed course of antibiotics ? ?Shortness of breath with activity ?-Likely multifactorial ?-Continue bronchodilators ? ?Atelectasis on previous CT ?-Most recent chest x-ray was a year ago and was stable at the tim

## 2022-04-10 NOTE — Patient Instructions (Addendum)
Continue using CPAP on a nightly basis ?-Your CPAP appears to be working well ? ?Call us with significant concerns ? ?I will see you back in about 6 months ? ? ?

## 2022-04-11 ENCOUNTER — Encounter: Payer: Self-pay | Admitting: Pulmonary Disease

## 2022-04-22 ENCOUNTER — Ambulatory Visit (INDEPENDENT_AMBULATORY_CARE_PROVIDER_SITE_OTHER): Payer: Medicare (Managed Care) | Admitting: Cardiology

## 2022-04-22 ENCOUNTER — Encounter (HOSPITAL_BASED_OUTPATIENT_CLINIC_OR_DEPARTMENT_OTHER): Payer: Self-pay | Admitting: Cardiology

## 2022-04-22 VITALS — BP 126/72 | HR 86 | Ht <= 58 in | Wt 205.7 lb

## 2022-04-22 DIAGNOSIS — I10 Essential (primary) hypertension: Secondary | ICD-10-CM

## 2022-04-22 DIAGNOSIS — J9611 Chronic respiratory failure with hypoxia: Secondary | ICD-10-CM

## 2022-04-22 DIAGNOSIS — I2510 Atherosclerotic heart disease of native coronary artery without angina pectoris: Secondary | ICD-10-CM

## 2022-04-22 DIAGNOSIS — Z9981 Dependence on supplemental oxygen: Secondary | ICD-10-CM

## 2022-04-22 DIAGNOSIS — I272 Pulmonary hypertension, unspecified: Secondary | ICD-10-CM

## 2022-04-22 NOTE — Patient Instructions (Signed)
Medication Instructions:  ?Your Physician recommend you continue on your current medication as directed.   ? ?*If you need a refill on your cardiac medications before your next appointment, please call your pharmacy* ? ? ?Lab Work: ?None ordered today ? ? ?Testing/Procedures: ?None ordered today ? ? ?Follow-Up: ?At [facility], you and your health needs are our priority.  As part of our continuing mission to provide you with exceptional heart care, we have created designated Provider Care Teams.  These Care Teams include your primary Cardiologist (physician) and Advanced Practice Providers (APPs -  Physician Assistants and Nurse Practitioners) who all work together to provide you with the care you need, when you need it. ? ?We recommend signing up for the patient portal called "MyChart".  Sign up information is provided on this After Visit Summary.  MyChart is used to connect with patients for Virtual Visits (Telemedicine).  Patients are able to view lab/test results, encounter notes, upcoming appointments, etc.  Non-urgent messages can be sent to your provider as well.   ?To learn more about what you can do with MyChart, go to [email or web address].   ? ?Your next appointment:   ?6 month(s) ? ?The format for your next appointment:   ?In Person ? ?Provider:   ?Bridgette Christopher, MD{ ? ? ?Important Information About Sugar ? ? ? ? ? ? ?

## 2022-04-22 NOTE — Progress Notes (Signed)
?Cardiology Office Note:   ? ?Date:  04/22/2022  ? ?IDRoseanna [name], DOB December 11, 1933, MRN [id number] ? ?PCP:  [facility]  ?Cardiologist:  Buford Dresser, MD PhD ? ?Referring MD: [facility], [place] ?CC: follow up ? ?History of Present Illness:   ? ?Jennifer [name] is a 88 y.o. female with a hx of CAD with stable angina, HTN, HLD, sleep apnea, GERD with hiatal hernia/esophageal stricture, pulmonary hypertension with chronic hypoxemic respiratory failure who is seen for follow up today. I initially met her 01/02/20 as a new patient to me/transfer from Dr. Saunders Revel. ? ?Cardiac history: PCI to LAD in 2009 in [place]. Had repeat cath in 2014 due to accelerating angina, stent patent, jailed diagonal, no intervention. Nuc stress 2018 without ischemic, echo 2018 with normal LV function and RVSP 26 mmHg. She established with Dr. Saunders Revel in 05/2018 after moving from [place]. Nuclear stress done here in 06/2018 showed breast attenuation but no ischemia. Echo 06/2018 showed normal LV function with pHtn (PASP 55). She had her ranexa dose decreased due to dizziness 08/2018. She had palpitations in 09/2018 and had 30 day event monitor ordered, which showed no significant arrhythmias.  ? ?Today: ?Saw Dr. Ander Slade on 04/10/22. Using CPAP 100% of the time. No changes to regimen at that visit.  ? ?Overall feels so-so. Breathing is rough at night, but stable. Swelling in the legs is also unchanged. Sleeps with her bed inclined chronically. Can walk 1-2 blocks on a good day. ? ?Two weeks ago had sudden sharp chest pain, tender to touch. Daughter put a topical cream on it and it got better. ? ?Denies chest pain. No PND or unexpected weight gain. No syncope or palpitations.  ? ?Past Medical History:  ?Diagnosis Date  ? Ambulates with cane   ? straight  ? At risk for falls   ? CAD (coronary artery disease)   ? Cataracts, bilateral   ? GERD (gastroesophageal reflux disease)   ? Hypercholesterolemia   ?  Hypertension   ? Hypothyroidism   ? Major depression   ? Musculoskeletal back pain   ? CHRONIC LEFT-SIDED DISCOMFORT  ? Obstructive sleep apnea on CPAP   ? uses CPAP nightly  ? Osteopenia   ? PVD (peripheral vascular disease) ([facility])   ? Stable angina (HCC)   ? SVD (spontaneous vaginal delivery)   ? x 2  ? Vitamin D deficiency   ? Wears glasses   ? Wears partial dentures   ? upper  ? ? ?Past Surgical History:  ?Procedure Laterality Date  ? ANKLE SURGERY Left   ? BACK SURGERY    ? CARDIAC CATHETERIZATION  06/2008  ? PCI to LAD with 3.0 x 12 mm DES  ? COLONOSCOPY    ? EYE SURGERY    ? remove cataracts - bilateral  ? MULTIPLE TOOTH EXTRACTIONS    ? upper teeth - upper dentures  ? OPEN REDUCTION INTERNAL FIXATION (ORIF) DISTAL RADIAL FRACTURE Left 05/16/2019  ? Procedure: OPEN REDUCTION INTERNAL FIXATION (ORIF) DISTAL RADIAL FRACTURE;  Surgeon: Charlotte Crumb, MD;  Location: [facility];  Service: Orthopedics;  Laterality: Left;  ? SPINAL FUSION    ? UPPER GI ENDOSCOPY    ? ? ?Current Medications: ?Current Outpatient Medications on File Prior to Visit  ?Medication Sig  ? acetaminophen (TYLENOL) 650 MG CR tablet Take 650 mg by mouth 2 (two) times a day.  ? albuterol (VENTOLIN HFA) 108 (90 Base) MCG/ACT inhaler Inhale  2 puffs into the lungs every 6 (six) hours as needed for wheezing or shortness of breath.  ? aspirin EC 81 MG tablet Take 81 mg by mouth daily. CARDIAC EVENT PREVENTION  ? Cholecalciferol (VITAMIN D3) 5000 units TABS Take 5,000 Units by mouth daily with breakfast.   ? enalapril (VASOTEC) 20 MG tablet Take 20 mg by mouth daily.  ? escitalopram (LEXAPRO) 5 MG tablet Take 5 mg by mouth daily.  ? fluticasone furoate-vilanterol (BREO ELLIPTA) 100-25 MCG/INH AEPB Inhale 1 puff into the lungs daily.  ? ibuprofen (ADVIL) 200 MG tablet Take 600 mg by mouth daily as needed.  ? levothyroxine (SYNTHROID, LEVOTHROID) 50 MCG tablet Take 50 mcg by mouth daily before breakfast.  ? mirtazapine (REMERON) 7.5 MG tablet Take 7.5 mg by  mouth at bedtime.  ? Multiple Vitamins-Minerals (PRESERVISION AREDS 2) CAPS Take 1 tablet by mouth daily.  ? nitroGLYCERIN (NITROSTAT) 0.4 MG SL tablet Place 0.4 mg under the tongue every 5 (five) minutes as needed for chest pain.  ? pantoprazole (PROTONIX) 20 MG tablet Take 20 mg by mouth daily.  ? pravastatin (PRAVACHOL) 80 MG tablet Take 80 mg by mouth every evening.  ? ranolazine (RANEXA) 500 MG 12 hr tablet Take 500 mg by mouth 2 (two) times daily.  ? Spacer/Aero-Holding Chambers (AEROCHAMBER MV) inhaler Use as instructed  ? torsemide (DEMADEX) 20 MG tablet Take 1 tablet (20 mg total) by mouth 2 (two) times daily.  ? traMADol (ULTRAM) 50 MG tablet Take 50 mg by mouth daily as needed for moderate pain.  ? ?No current facility-administered medications on file prior to visit.  ?  ? ?Allergies:   Patient has no known allergies.  ? ?Social History  ? ?Tobacco Use  ? Smoking status: Never  ? Smokeless tobacco: Never  ?Vaping Use  ? Vaping Use: Never used  ?Substance Use Topics  ? Alcohol use: Never  ? Drug use: Never  ? ? ?Family History: ?The patient's family history includes Asthma in her father; Heart disease in her mother; Heart disease (age of onset: 81) in her brother. ? ?ROS:   ?Please see the history of present illness.  Additional pertinent ROS negative except as documented. ? ? ?EKGs/Labs/Other Studies Reviewed:   ? ?The following studies were reviewed today: ?Echo 09/28/20 ? 1. Left ventricular ejection fraction, by estimation, is 65 to 70%. Left  ?ventricular ejection fraction by 3D volume is 67 %. The left ventricle has  ?normal function. The left ventricle has no regional wall motion  ?abnormalities. Left ventricular diastolic  ? parameters are consistent with Grade I diastolic dysfunction (impaired  ?relaxation).  ? 2. Right ventricular systolic function is normal. The right ventricular  ?size is normal. There is moderately elevated pulmonary artery systolic  ?pressure. The estimated right ventricular  systolic pressure is 32.1 mmHg.  ? 3. The mitral valve is normal in structure. No evidence of mitral valve  ?regurgitation. No evidence of mitral stenosis.  ? 4. The aortic valve is normal in structure. Aortic valve regurgitation is  ?not visualized. No aortic stenosis is present.  ? 5. The inferior vena cava is normal in size with greater than 50%  ?respiratory variability, suggesting right atrial pressure of 3 mmHg.  ? ?Comparison(s): Prior images reviewed side by side. Changes from prior  ?study are noted. 02/23/20 EF 60-65%. GLS -25.3%. PA pressure 30mHg. ? ?Monitor 10/21/18 ?The patient was enrolled for 30 days. 60% of the monitoring period yielded diagnostic tracings. ?The predominant rhythm was  sinus with an average rate of 72 bpm (range 51 to 125 bpm). ?Isolated PACs and PVCs were noted. ?No sustained arrhythmia or prolonged pause was identified. ?  ?Predominantly sinus rhythm with isolated PACs and PVCs.  No sustained arrhythmia. ? ?lexiscan 7.11.19 ?The left ventricular ejection fraction is hyperdynamic (>65%). ?Nuclear stress EF: 71%. ?There was no ST segment deviation noted during stress. ?There is a small defect of mild severity present in the basal anteroseptal, mid anteroseptal and apical septal location. The defect is non-reversible that is consistent with breast attenuation artifact. No ischemia noted. ?This is a low risk study. ?  ?Echo 06/17/18 ?- Left ventricle: The cavity size was normal. Wall thickness was ?  normal. Systolic function was normal. The estimated ejection ?  fraction was in the range of 60% to 65%. Wall motion was normal; ?  there were no regional wall motion abnormalities. Left ?  ventricular diastolic function parameters were normal. ?- Mitral valve: Calcified annulus. There was mild regurgitation. ?- Pulmonary arteries: Systolic pressure was moderately increased. ?  PA peak pressure: 55 mm Hg (S). ?  ?Impressions:  ?- Normal LV systolic function; mild MR; trace TR with  moderate ?  pulmonary hypertension. ? ?EKG:  EKG is personally reviewed. ?04/22/22: SR at 86 bpm with PAC and PVC ?07/12/21: NSR at 62 bpm ?08/10/20: normal sinus rhythm at 74 bpm ? ?Recent Labs: ?No results found fo

## 2022-04-22 NOTE — Addendum Note (Signed)
Addended by: Parke Poisson on: 04/22/2022 04:40 PM ? ? Modules accepted: Orders ? ?

## 2022-07-03 ENCOUNTER — Ambulatory Visit
Admission: RE | Admit: 2022-07-03 | Discharge: 2022-07-03 | Disposition: A | Discharge status: Home / Self Care | Payer: Medicare (Managed Care) | Source: Ambulatory Visit | Attending: Vascular Surgery | Admitting: Vascular Surgery

## 2022-07-03 ENCOUNTER — Other Ambulatory Visit: Payer: Self-pay | Admitting: Vascular Surgery

## 2022-07-03 DIAGNOSIS — W19XXXA Unspecified fall, initial encounter: Secondary | ICD-10-CM

## 2022-09-22 ENCOUNTER — Ambulatory Visit (INDEPENDENT_AMBULATORY_CARE_PROVIDER_SITE_OTHER): Payer: Medicare (Managed Care) | Admitting: Pulmonary Disease

## 2022-09-22 ENCOUNTER — Encounter: Payer: Self-pay | Admitting: Pulmonary Disease

## 2022-09-22 VITALS — BP 122/82 | HR 62 | Temp 98.0°F | Ht <= 58 in | Wt 192.8 lb

## 2022-09-22 DIAGNOSIS — R0602 Shortness of breath: Secondary | ICD-10-CM | POA: Diagnosis not present

## 2022-09-22 DIAGNOSIS — G4733 Obstructive sleep apnea (adult) (pediatric): Secondary | ICD-10-CM | POA: Diagnosis not present

## 2022-09-22 MED ORDER — BENZONATATE 200 MG PO CAPS: 200.0000 mg | ORAL_CAPSULE | Freq: Three times a day (TID) | ORAL | 1 refills | Status: DC | PRN

## 2022-09-22 NOTE — Progress Notes (Signed)
Jennifer [name]    [id number]    03-12-1934  [facility], Jennifer [name]  Referring Physician: Camargo, Jennifer [name] Jennifer [name],  Jennifer [name] [id number]  Chief complaint:    Follow-up obstructive sleep apnea Has underlying pulmonary hypertension, multifactorial shortness of breath  HPI:  Continues to do relatively well  She does have cough and some nights to keep her from sleeping Has a history of large hiatal hernia that has been stable  She has obstructive sleep apnea for which she uses CPAP on a nightly basis  Not having any significant difficulty with tolerating CPAP at present  Uses a walker for ambulation Exercise tolerance limited for about a block to a block and a half  She is on oxygen supplementation around-the-clock  History of coronary artery disease History of pulmonary hypertension  Previous history Patient recently moved here from [place], did speak with Dr. Gennette Pac regarding a CT Repeat CT significant for some atelectasis She has a history of moderate pulmonary hypertension-echo did confirm moderate pulmonary hypertension-2019   Outpatient Encounter Medications as of 09/22/2022  Medication Sig   acetaminophen (TYLENOL) 650 MG CR tablet Take 650 mg by mouth 2 (two) times a day.   albuterol (VENTOLIN HFA) 108 (90 Base) MCG/ACT inhaler Inhale 2 puffs into the lungs every 6 (six) hours as needed for wheezing or shortness of breath.   aspirin EC 81 MG tablet Take 81 mg by mouth daily. CARDIAC EVENT PREVENTION   Cholecalciferol (VITAMIN D3) 5000 units TABS Take 5,000 Units by mouth daily with breakfast.    enalapril (VASOTEC) 20 MG tablet Take 20 mg by mouth daily.   escitalopram (LEXAPRO) 5 MG tablet Take 5 mg by mouth daily.   fluticasone furoate-vilanterol (BREO ELLIPTA) 100-25 MCG/INH AEPB Inhale 1 puff into the lungs daily.   ibuprofen (ADVIL) 200 MG tablet Take 600 mg by  mouth daily as needed.   levothyroxine (SYNTHROID, LEVOTHROID) 50 MCG tablet Take 50 mcg by mouth daily before breakfast.   mirtazapine (REMERON) 7.5 MG tablet Take 7.5 mg by mouth at bedtime.   Multiple Vitamins-Minerals (PRESERVISION AREDS 2) CAPS Take 1 tablet by mouth daily.   nitroGLYCERIN (NITROSTAT) 0.4 MG SL tablet Place 0.4 mg under the tongue every 5 (five) minutes as needed for chest pain.   pantoprazole (PROTONIX) 20 MG tablet Take 20 mg by mouth daily.   pravastatin (PRAVACHOL) 80 MG tablet Take 80 mg by mouth every evening.   ranolazine (RANEXA) 500 MG 12 hr tablet Take 500 mg by mouth 2 (two) times daily.   Spacer/Aero-Holding Chambers (AEROCHAMBER MV) inhaler Use as instructed   traMADol (ULTRAM) 50 MG tablet Take 50 mg by mouth daily as needed for moderate pain.   torsemide (DEMADEX) 20 MG tablet Take 1 tablet (20 mg total) by mouth 2 (two) times daily.   No facility-administered encounter medications on file as of 09/22/2022.    Allergies as of 09/22/2022   (No Known Allergies)    Past Medical History:  Diagnosis Date   Ambulates with cane    straight   At risk for falls    CAD (coronary artery disease)    Cataracts, bilateral    GERD (gastroesophageal reflux disease)    Hypercholesterolemia    Hypertension    Hypothyroidism    Major depression    Musculoskeletal back pain    CHRONIC LEFT-SIDED DISCOMFORT   Obstructive sleep apnea  on CPAP    uses CPAP nightly   Osteopenia    PVD (peripheral vascular disease) (HCC)    Stable angina    SVD (spontaneous vaginal delivery)    x 2   Vitamin D deficiency    Wears glasses    Wears partial dentures    upper    Past Surgical History:  Procedure Laterality Date   ANKLE SURGERY Left    BACK SURGERY     CARDIAC CATHETERIZATION  06/2008   PCI to LAD with 3.0 x 12 mm DES   COLONOSCOPY     EYE SURGERY     remove cataracts - bilateral   MULTIPLE TOOTH EXTRACTIONS     upper teeth - upper dentures   OPEN  REDUCTION INTERNAL FIXATION (ORIF) DISTAL RADIAL FRACTURE Left 05/16/2019   Procedure: OPEN REDUCTION INTERNAL FIXATION (ORIF) DISTAL RADIAL FRACTURE;  Surgeon: Dairl Ponder, MD;  Location: [facility];  Service: Orthopedics;  Laterality: Left;   SPINAL FUSION     UPPER GI ENDOSCOPY      Family History  Problem Relation Age of Onset   Heart disease Mother    Asthma Father    Heart disease Brother 67       CAD    Social History   Socioeconomic History   Marital status: Widowed    Spouse name: Not on file   Number of children: 2   Years of education: Not on file   Highest education level: Not on file  Occupational History   Not on file  Tobacco Use   Smoking status: Never   Smokeless tobacco: Never  Vaping Use   Vaping Use: Never used  Substance and Sexual Activity   Alcohol use: Never   Drug use: Never   Sexual activity: Not on file  Other Topics Concern   Not on file  Social History Narrative   Not on file   Social Determinants of Health   Financial Resource Strain: Not on file  Food Insecurity: Not on file  Transportation Needs: Not on file  Physical Activity: Not on file  Stress: Not on file  Social Connections: Not on file  Intimate Partner Violence: Not on file    Review of Systems  Constitutional:  Positive for fatigue.  HENT: Negative.    Eyes: Negative.   Respiratory:  Positive for apnea, shortness of breath and wheezing. Negative for cough.   Cardiovascular:  Negative for chest pain.       Chest wall discomfort  Gastrointestinal: Negative.   Endocrine: Negative.   Musculoskeletal:  Positive for arthralgias.  Psychiatric/Behavioral:  Positive for sleep disturbance.   All other systems reviewed and are negative.   Vitals:   09/22/22 1041  BP: 122/82  Pulse: 62  Temp: 98 F (36.7 C)  SpO2: 98%   Physical Exam Constitutional:      Appearance: She is well-developed. She is obese.     Comments: Overweight  HENT:     Head: Normocephalic and  atraumatic.     Mouth/Throat:     Mouth: Mucous membranes are moist.  Neck:     Thyroid: No thyromegaly.     Trachea: No tracheal deviation.  Cardiovascular:     Rate and Rhythm: Normal rate and regular rhythm.  Pulmonary:     Effort: Pulmonary effort is normal. No respiratory distress.     Breath sounds: No wheezing or rales.     Comments: Decreased AE bilaterally Chest:     Chest wall: No  tenderness.  Musculoskeletal:     Cervical back: No rigidity or tenderness.  Neurological:     Mental Status: She is alert.     Comments: Uses a walker  Psychiatric:        Mood and Affect: Mood normal.    Data Reviewed: CT of the chest reviewed showing midlung atelectasis-unchanged from previous -Reviewed by myself  Echocardiogram with evidence of severe pulmonary hypertension, diastolic dysfunction -This is likely related to obstructive sleep apnea, chronic hypoxemia, diastolic dysfunction  Most recent CPAP compliance shows 97% compliance Average use of 5 hours 50 minutes Set between 5 and 15 AHI of 5.2  Assessment:   .  Obstructive sleep apnea -Adequately treated with CPAP therapy -Encouraged to get about 6 to 8 hours of sleep  .  Nocturnal desaturations -Encouraged to continue CPAP nightly  Chronic cough -We will give her a prescription for Tessalon Perles to be used as needed  .  History of pulmonary hypertension -Multifactorial -Likely related to chronic hypoxemia -Continue using CPAP on a nightly basis  .  Coronary artery disease -Encouraged to continue follow-up with cardiology on a regular basis  Shortness of breath with activity -Multifactorial -Continue bronchodilators  Atelectasis on previous CT -Stable on previous reviews   Plan/Recommendations:  Continue CPAP nightly  Continue oxygen supplementation  Continue inhalers  Albuterol as needed  Prescription for Jerilynn Som sent to pharmacy  Follow-up in 6 months   Virl Diamond  MD  Pulmonary and Critical Care 09/22/2022, 10:44 AM  CC: [facility] [employer] [facility]*

## 2022-09-22 NOTE — Patient Instructions (Signed)
I will see you 6 months from here  Prescription for Tessalon Perles-cough medicine sent to pharmacy for you-only need to use it as needed, if you are coughing a lot  No other changes  Continue using your CPAP every night  Call me with any significant concerns

## 2022-10-23 ENCOUNTER — Ambulatory Visit (INDEPENDENT_AMBULATORY_CARE_PROVIDER_SITE_OTHER): Payer: Medicare (Managed Care) | Admitting: Cardiology

## 2022-10-23 ENCOUNTER — Encounter (HOSPITAL_BASED_OUTPATIENT_CLINIC_OR_DEPARTMENT_OTHER): Payer: Self-pay | Admitting: Cardiology

## 2022-10-23 VITALS — BP 136/78 | HR 75 | Ht <= 58 in | Wt 194.0 lb

## 2022-10-23 DIAGNOSIS — I2510 Atherosclerotic heart disease of native coronary artery without angina pectoris: Secondary | ICD-10-CM | POA: Diagnosis not present

## 2022-10-23 DIAGNOSIS — Z9981 Dependence on supplemental oxygen: Secondary | ICD-10-CM

## 2022-10-23 DIAGNOSIS — I272 Pulmonary hypertension, unspecified: Secondary | ICD-10-CM

## 2022-10-23 DIAGNOSIS — I10 Essential (primary) hypertension: Secondary | ICD-10-CM

## 2022-10-23 DIAGNOSIS — R600 Localized edema: Secondary | ICD-10-CM | POA: Diagnosis not present

## 2022-10-23 DIAGNOSIS — J9611 Chronic respiratory failure with hypoxia: Secondary | ICD-10-CM | POA: Diagnosis not present

## 2022-10-23 NOTE — Patient Instructions (Signed)
Medication Instructions:  YOUR PROVIDER HAS RECOMMENDED YOU CONTINUE YOUR CURRENT MEDICATIONS  *If you need a refill on your cardiac medications before your next appointment, please call your pharmacy*  Follow-Up: At [facility], you and your health needs are our priority.  As part of our continuing mission to provide you with exceptional heart care, we have created designated Provider Care Teams.  These Care Teams include your primary Cardiologist (physician) and Advanced Practice Providers (APPs -  Physician Assistants and Nurse Practitioners) who all work together to provide you with the care you need, when you need it.  We recommend signing up for the patient portal called "MyChart".  Sign up information is provided on this After Visit Summary.  MyChart is used to connect with patients for Virtual Visits (Telemedicine).  Patients are able to view lab/test results, encounter notes, upcoming appointments, etc.  Non-urgent messages can be sent to your provider as well.   To learn more about what you can do with MyChart, go to [email or web address].    Your next appointment:   6 month(s)  The format for your next appointment:   In Person  Provider:   Jodelle Red, MD

## 2022-10-23 NOTE — Progress Notes (Signed)
Cardiology Office Note:    Date:  10/26/2022   ID:  Yianna Tersigni, DOB [birth year], MRN [id number]  PCP:  [facility] [place]  Cardiologist:  Buford Dresser, MD PhD  Referring MD: [facility]*   CC: follow up  History of Present Illness:    Jennifer [name] is a 88 y.o. female with a hx of CAD with stable angina, HTN, HLD, sleep apnea, GERD with hiatal hernia/esophageal stricture, pulmonary hypertension with chronic hypoxemic respiratory failure who is seen for follow up today. I initially met her 01/02/20 as a new patient to me/transfer from Dr. Saunders Revel.  Cardiac history: PCI to LAD in 2009 in [place]. Had repeat cath in 2014 due to accelerating angina, stent patent, jailed diagonal, no intervention. Nuc stress 2018 without ischemic, echo 2018 with normal LV function and RVSP 26 mmHg. She established with Dr. Saunders Revel in 05/2018 after moving from [place]. Nuclear stress done here in 06/2018 showed breast attenuation but no ischemia. Echo 06/2018 showed normal LV function with pHtn (PASP 55). She had her ranexa dose decreased due to dizziness 08/2018. She had palpitations in 09/2018 and had 30 day event monitor ordered, which showed no significant arrhythmias.   Saw Dr. Ander Slade on 04/10/22. Was using CPAP 100% of the time. No changes to regimen at that visit.   At her last visit she complained of rough but stable breathing at night. Swelling in the legs unchanged. Slept with her bed inclined chronically. Two weeks prior she developed sudden sharp chest pain, tender to touch. Daughter put a topical cream on it with improvement.  Today, she is feeling better. Her chest pain has not been as prominent and her swelling has improved.  She remains on oxygen. In the middle of the night she wakes up with shortness of breath, but she attributes this to her CPAP mask which is loose.  She is tolerating all of her medications.  She denies any palpitations,  lightheadedness, headaches, or syncope.   Past Medical History:  Diagnosis Date   Ambulates with cane    straight   At risk for falls    CAD (coronary artery disease)    Cataracts, bilateral    GERD (gastroesophageal reflux disease)    Hypercholesterolemia    Hypertension    Hypothyroidism    Major depression    Musculoskeletal back pain    CHRONIC LEFT-SIDED DISCOMFORT   Obstructive sleep apnea on CPAP    uses CPAP nightly   Osteopenia    PVD (peripheral vascular disease) (HCC)    Stable angina    SVD (spontaneous vaginal delivery)    x 2   Vitamin D deficiency    Wears glasses    Wears partial dentures    upper    Past Surgical History:  Procedure Laterality Date   ANKLE SURGERY Left    BACK SURGERY     CARDIAC CATHETERIZATION  06/2008   PCI to LAD with 3.0 x 12 mm DES   COLONOSCOPY     EYE SURGERY     remove cataracts - bilateral   MULTIPLE TOOTH EXTRACTIONS     upper teeth - upper dentures   OPEN REDUCTION INTERNAL FIXATION (ORIF) DISTAL RADIAL FRACTURE Left 05/16/2019   Procedure: OPEN REDUCTION INTERNAL FIXATION (ORIF) DISTAL RADIAL FRACTURE;  Surgeon: Charlotte Crumb, MD;  Location: [facility];  Service: Orthopedics;  Laterality: Left;   SPINAL FUSION     UPPER GI ENDOSCOPY      Current  Medications: Current Outpatient Medications on File Prior to Visit  Medication Sig   acetaminophen (TYLENOL) 650 MG CR tablet Take 650 mg by mouth 2 (two) times a day.   albuterol (VENTOLIN HFA) 108 (90 Base) MCG/ACT inhaler Inhale 2 puffs into the lungs every 6 (six) hours as needed for wheezing or shortness of breath.   aspirin EC 81 MG tablet Take 81 mg by mouth daily. CARDIAC EVENT PREVENTION   benzonatate (TESSALON) 200 MG capsule Take 1 capsule (200 mg total) by mouth 3 (three) times daily as needed for cough.   Cholecalciferol (VITAMIN D3) 5000 units TABS Take 5,000 Units by mouth daily with breakfast.    enalapril (VASOTEC) 20 MG tablet Take 20 mg by mouth daily.    escitalopram (LEXAPRO) 5 MG tablet Take 5 mg by mouth daily.   fluticasone furoate-vilanterol (BREO ELLIPTA) 100-25 MCG/INH AEPB Inhale 1 puff into the lungs daily.   ibuprofen (ADVIL) 200 MG tablet Take 600 mg by mouth daily as needed.   levothyroxine (SYNTHROID, LEVOTHROID) 50 MCG tablet Take 50 mcg by mouth daily before breakfast.   mirtazapine (REMERON) 7.5 MG tablet Take 7.5 mg by mouth at bedtime.   Multiple Vitamins-Minerals (PRESERVISION AREDS 2) CAPS Take 1 tablet by mouth daily.   nitroGLYCERIN (NITROSTAT) 0.4 MG SL tablet Place 0.4 mg under the tongue every 5 (five) minutes as needed for chest pain.   pantoprazole (PROTONIX) 20 MG tablet Take 20 mg by mouth daily.   pravastatin (PRAVACHOL) 80 MG tablet Take 80 mg by mouth every evening.   ranolazine (RANEXA) 500 MG 12 hr tablet Take 500 mg by mouth 2 (two) times daily.   Spacer/Aero-Holding Chambers (AEROCHAMBER MV) inhaler Use as instructed   traMADol (ULTRAM) 50 MG tablet Take 50 mg by mouth daily as needed for moderate pain.   torsemide (DEMADEX) 20 MG tablet Take 1 tablet (20 mg total) by mouth 2 (two) times daily.   No current facility-administered medications on file prior to visit.     Allergies:   Patient has no known allergies.   Social History   Tobacco Use   Smoking status: Never   Smokeless tobacco: Never  Vaping Use   Vaping Use: Never used  Substance Use Topics   Alcohol use: Never   Drug use: Never    Family History: The patient's family history includes Asthma in her father; Heart disease in her mother; Heart disease (age of onset: 83) in her brother.  ROS:   Please see the history of present illness.   (+) Shortness of breath (+) Bilateral LE edema Additional pertinent ROS negative except as documented.   EKGs/Labs/Other Studies Reviewed:    The following studies were reviewed today:  Echo 09/28/20  1. Left ventricular ejection fraction, by estimation, is 65 to 70%. Left  ventricular ejection  fraction by 3D volume is 67 %. The left ventricle has  normal function. The left ventricle has no regional wall motion  abnormalities. Left ventricular diastolic   parameters are consistent with Grade I diastolic dysfunction (impaired  relaxation).   2. Right ventricular systolic function is normal. The right ventricular  size is normal. There is moderately elevated pulmonary artery systolic  pressure. The estimated right ventricular systolic pressure is 56.3 mmHg.   3. The mitral valve is normal in structure. No evidence of mitral valve  regurgitation. No evidence of mitral stenosis.   4. The aortic valve is normal in structure. Aortic valve regurgitation is  not visualized. No aortic  stenosis is present.   5. The inferior vena cava is normal in size with greater than 50%  respiratory variability, suggesting right atrial pressure of 3 mmHg.   Comparison(s): Prior images reviewed side by side. Changes from prior  study are noted. 02/23/20 EF 60-65%. GLS -25.3%. PA pressure 75mHg.  Monitor 10/21/18 The patient was enrolled for 30 days. 60% of the monitoring period yielded diagnostic tracings. The predominant rhythm was sinus with an average rate of 72 bpm (range 51 to 125 bpm). Isolated PACs and PVCs were noted. No sustained arrhythmia or prolonged pause was identified.   Predominantly sinus rhythm with isolated PACs and PVCs.  No sustained arrhythmia.  lexiscan 7.11.19 The left ventricular ejection fraction is hyperdynamic (>65%). Nuclear stress EF: 71%. There was no ST segment deviation noted during stress. There is a small defect of mild severity present in the basal anteroseptal, mid anteroseptal and apical septal location. The defect is non-reversible that is consistent with breast attenuation artifact. No ischemia noted. This is a low risk study.   Echo 06/17/18 - Left ventricle: The cavity size was normal. Wall thickness was   normal. Systolic function was normal. The  estimated ejection   fraction was in the range of 60% to 65%. Wall motion was normal;   there were no regional wall motion abnormalities. Left   ventricular diastolic function parameters were normal. - Mitral valve: Calcified annulus. There was mild regurgitation. - Pulmonary arteries: Systolic pressure was moderately increased.   PA peak pressure: 55 mm Hg (S).   Impressions:  - Normal LV systolic function; mild MR; trace TR with moderate   pulmonary hypertension.  EKG:  EKG is personally reviewed. 10/23/2022:  not ordered today 04/22/22: SR at 86 bpm with PAC and PVC 07/12/21: NSR at 62 bpm 08/10/20: normal sinus rhythm at 74 bpm  Recent Labs: No results found for requested labs within last 365 days.   Recent Lipid Panel    Component Value Date/Time   CHOL 127 09/02/2019 0903   TRIG 57 09/02/2019 0903   HDL 77 09/02/2019 0903   CHOLHDL 1.6 09/02/2019 0903   LDLCALC 37 09/02/2019 0903    Physical Exam:    VS:  BP 136/78   Pulse 75   Ht _0  (1.473 m)   Wt 194 lb (88 kg)   LMP  (LMP Unknown)   BMI 40.55 kg/m     Wt Readings from Last 3 Encounters:  10/23/22 194 lb (88 kg)  09/22/22 192 lb 12.8 oz (87.5 kg)  04/22/22 205 lb 11.2 oz (93.3 kg)    GEN: Well nourished, well developed in no acute distress HEENT: Normal, moist mucous membranes NECK: No JVD CARDIAC: regular rhythm with occasional premature beat, normal S1 and S2, no rubs or gallops. 1/6 systolic murmur. VASCULAR: Radial and DP pulses 2+ bilaterally. No carotid bruits RESPIRATORY:  distant but clear, O2 in place ABDOMEN: Soft, non-tender, non-distended MUSCULOSKELETAL:  Ambulates independently with rollator SKIN: Warm and dry, no significant pitting LE edema, compression stockings in place NEUROLOGIC:  Alert and oriented x 3. No focal neuro deficits noted. PSYCHIATRIC:  Normal affect    ASSESSMENT:    1. Pulmonary hypertension, unspecified ([place]   2. Chronic respiratory failure with hypoxia, on home  O2 therapy ([place]   3. Coronary artery disease involving native coronary artery of native heart without angina pectoris   4. Bilateral leg edema   5. Essential hypertension     PLAN:    Pulmonary hypertension  Chronic hypoxemic respiratory failure Chronic dyspnea on exertion, LE edema. -echo with hyperdynamic LVEF, only grade 1 diastolic dysfunction, and elevated right sided pressures -consistent with pulmonary hypertension as cause -on chronic home O2, follows with Dr. Ander Slade for sleep apnea and pulmonary hypertension -continue torsemide BID  CAD:  -no angina, did have atypical chest pain (tender) once -continue aspirin 81 mg, pravastatin 80 mg, ranolazine 500 mg BID -counseled on red flag warning signs requiring immediate medical attention -has PRN SL NG, has not required  Hypertension: near goal <130/80 -continue enalapril -tolerated low dose amlodipine in the past if needed in the future -continue torsemide as above -had issues with beta blocker in the past  Secondary prevention -recommend heart healthy/Mediterranean diet, with whole grains, fruits, vegetable, fish, lean meats, nuts, and olive oil. Limit salt. -recommend avoidance of tobacco products. Avoid excess alcohol.  Plan for follow up: 6 mos or sooner as needed  Copy of note will be sent to: [facility] [place], [place] [address] Fax [phone]  Medication Adjustments/Labs and Tests Ordered: Current medicines are reviewed at length with the patient today.  Concerns regarding medicines are outlined above.   No orders of the defined types were placed in this encounter.  No orders of the defined types were placed in this encounter.  Patient Instructions  Medication Instructions:  YOUR PROVIDER HAS RECOMMENDED YOU CONTINUE YOUR CURRENT MEDICATIONS  *If you need a refill on your cardiac medications before your next appointment, please call your pharmacy*  Follow-Up: At [facility], you and your health needs are our priority.  As part of our continuing mission to provide you with exceptional heart care, we have created designated Provider Care Teams.  These Care Teams include your primary Cardiologist (physician) and Advanced Practice Providers (APPs -  Physician Assistants and Nurse Practitioners) who all work together to provide you with the care you need, when you need it.  We recommend signing up for the patient portal called "MyChart".  Sign up information is provided on this After Visit Summary.  MyChart is used to connect with patients for Virtual Visits (Telemedicine).  Patients are able to view lab/test results, encounter notes, upcoming appointments, etc.  Non-urgent messages can be sent to your provider as well.   To learn more about what you can do with MyChart, go to [email or web address].    Your next appointment:   6 month(s)  The format for your next appointment:   In Person  Provider:   Buford Dresser, MD            [facility],acting as a scribe for Buford Dresser, MD.,have documented all relevant documentation on the behalf of Buford Dresser, MD,as directed by  Buford Dresser, MD while in the presence of Buford Dresser, MD.  I, Buford Dresser, MD, have reviewed all documentation for this visit. The documentation on 10/26/22 for the exam, diagnosis, procedures, and orders are all accurate and complete.   Signed, Buford Dresser, MD PhD 10/26/2022   [place]

## 2022-10-26 ENCOUNTER — Encounter (HOSPITAL_BASED_OUTPATIENT_CLINIC_OR_DEPARTMENT_OTHER): Payer: Self-pay | Admitting: Cardiology

## 2022-12-18 ENCOUNTER — Other Ambulatory Visit: Payer: Self-pay | Admitting: Vascular Surgery

## 2022-12-18 DIAGNOSIS — R531 Weakness: Secondary | ICD-10-CM

## 2022-12-19 ENCOUNTER — Other Ambulatory Visit: Payer: Medicare (Managed Care)

## 2023-02-04 ENCOUNTER — Ambulatory Visit
Admission: RE | Admit: 2023-02-04 | Discharge: 2023-02-04 | Disposition: A | Discharge status: Home / Self Care | Payer: Medicare (Managed Care) | Source: Ambulatory Visit | Attending: Vascular Surgery | Admitting: Vascular Surgery

## 2023-02-04 DIAGNOSIS — R531 Weakness: Secondary | ICD-10-CM

## 2023-02-05 ENCOUNTER — Other Ambulatory Visit (HOSPITAL_COMMUNITY): Payer: Self-pay | Admitting: Vascular Surgery

## 2023-02-05 DIAGNOSIS — I272 Pulmonary hypertension, unspecified: Secondary | ICD-10-CM

## 2023-02-25 ENCOUNTER — Ambulatory Visit (HOSPITAL_BASED_OUTPATIENT_CLINIC_OR_DEPARTMENT_OTHER): Payer: Medicare (Managed Care)

## 2023-02-25 DIAGNOSIS — I272 Pulmonary hypertension, unspecified: Secondary | ICD-10-CM | POA: Diagnosis not present

## 2023-02-25 LAB — ECHOCARDIOGRAM COMPLETE
AR max vel: 1.9 cm2
AV Area VTI: 2.05 cm2
AV Area mean vel: 1.92 cm2
AV Mean grad: 8 mmHg
AV Peak grad: 14.4 mmHg
Ao pk vel: 1.9 m/s
Area-P 1/2: 3.85 cm2
MV M vel: 3.6 m/s
MV Peak grad: 51.8 mmHg
S' Lateral: 2.05 cm

## 2023-03-19 ENCOUNTER — Encounter: Payer: Self-pay | Admitting: Pulmonary Disease

## 2023-03-19 ENCOUNTER — Ambulatory Visit: Payer: Medicare (Managed Care)

## 2023-03-19 ENCOUNTER — Ambulatory Visit: Payer: Medicare (Managed Care) | Admitting: Pulmonary Disease

## 2023-03-19 VITALS — BP 118/68 | HR 65 | Ht 59.0 in | Wt 183.0 lb

## 2023-03-19 DIAGNOSIS — R0602 Shortness of breath: Secondary | ICD-10-CM | POA: Diagnosis not present

## 2023-03-19 MED ORDER — TRELEGY ELLIPTA 100-62.5-25 MCG/ACT IN AEPB: 1.0000 | INHALATION_SPRAY | Freq: Every day | RESPIRATORY_TRACT | 3 refills | Status: DC

## 2023-03-19 NOTE — Progress Notes (Signed)
Jennifer [name]    [id number]    [birth year]  Primary Care Physician:Inc, [facility]  Referring Physician: Inc, [facility] [address] [place],  [place] [address]  Chief complaint:    Follow-up obstructive sleep apnea Has underlying pulmonary hypertension, multifactorial shortness of breath  HPI:  Continues to do relatively well  According to daughter who sent a note with the patient, she has been using albuterol more frequently than usual  Cough is a little bit better Shortness of breath is stable, still concerning  History of large hiatal hernia, history of atelectasis, history of pulmonary hypertension  Continues to use her CPAP on a nightly basis and benefiting from it  She does use a walker for ambulation Exercise tolerance still limited but she does try to stay active  She is on oxygen supplementation around-the-clock -Compliant with oxygen use  History of coronary artery disease History of pulmonary hypertension  Previous history Patient recently moved here from [place], did speak with Dr. Avie Arenas regarding a CT Repeat CT significant for some atelectasis She has a history of moderate pulmonary hypertension-echo did confirm moderate pulmonary hypertension-2019  Her daughter states she uses albuterol very frequently and wonders whether she may benefit from a trial with other inhalers  Was exposed to a lot of secondhand smoke in the past , Has had a PFT in the past but none recently   Outpatient Encounter Medications as of 03/19/2023  Medication Sig   acetaminophen (TYLENOL) 650 MG CR tablet Take 650 mg by mouth 2 (two) times a day.   albuterol (VENTOLIN HFA) 108 (90 Base) MCG/ACT inhaler Inhale 2 puffs into the lungs every 6 (six) hours as needed for wheezing or shortness of breath.   aspirin EC 81 MG tablet Take 81 mg by mouth daily. CARDIAC EVENT PREVENTION   benzonatate (TESSALON) 200 MG  capsule Take 1 capsule (200 mg total) by mouth 3 (three) times daily as needed for cough.   Cholecalciferol (VITAMIN D3) 5000 units TABS Take 5,000 Units by mouth daily with breakfast.    enalapril (VASOTEC) 20 MG tablet Take 20 mg by mouth daily.   escitalopram (LEXAPRO) 5 MG tablet Take 5 mg by mouth daily.   fluticasone furoate-vilanterol (BREO ELLIPTA) 100-25 MCG/INH AEPB Inhale 1 puff into the lungs daily.   ibuprofen (ADVIL) 200 MG tablet Take 600 mg by mouth daily as needed.   levothyroxine (SYNTHROID, LEVOTHROID) 50 MCG tablet Take 50 mcg by mouth daily before breakfast.   mirtazapine (REMERON) 7.5 MG tablet Take 7.5 mg by mouth at bedtime.   Multiple Vitamins-Minerals (PRESERVISION AREDS 2) CAPS Take 1 tablet by mouth daily.   nitroGLYCERIN (NITROSTAT) 0.4 MG SL tablet Place 0.4 mg under the tongue every 5 (five) minutes as needed for chest pain.   pantoprazole (PROTONIX) 20 MG tablet Take 20 mg by mouth daily.   pravastatin (PRAVACHOL) 80 MG tablet Take 80 mg by mouth every evening.   ranolazine (RANEXA) 500 MG 12 hr tablet Take 500 mg by mouth 2 (two) times daily.   Spacer/Aero-Holding Chambers (AEROCHAMBER MV) inhaler Use as instructed   traMADol (ULTRAM) 50 MG tablet Take 50 mg by mouth daily as needed for moderate pain.   torsemide (DEMADEX) 20 MG tablet Take 1 tablet (20 mg total) by mouth 2 (two) times daily.   No facility-administered encounter medications on file as of 03/19/2023.    Allergies as of 03/19/2023   (  No Known Allergies)    Past Medical History:  Diagnosis Date   Ambulates with cane    straight   At risk for falls    CAD (coronary artery disease)    Cataracts, bilateral    GERD (gastroesophageal reflux disease)    Hypercholesterolemia    Hypertension    Hypothyroidism    Major depression    Musculoskeletal back pain    CHRONIC LEFT-SIDED DISCOMFORT   Obstructive sleep apnea on CPAP    uses CPAP nightly   Osteopenia    PVD (peripheral vascular  disease)    Stable angina    SVD (spontaneous vaginal delivery)    x 2   Vitamin D deficiency    Wears glasses    Wears partial dentures    upper    Past Surgical History:  Procedure Laterality Date   ANKLE SURGERY Left    BACK SURGERY     CARDIAC CATHETERIZATION  06/2008   PCI to LAD with 3.0 x 12 mm DES   COLONOSCOPY     EYE SURGERY     remove cataracts - bilateral   MULTIPLE TOOTH EXTRACTIONS     upper teeth - upper dentures   OPEN REDUCTION INTERNAL FIXATION (ORIF) DISTAL RADIAL FRACTURE Left 05/16/2019   Procedure: OPEN REDUCTION INTERNAL FIXATION (ORIF) DISTAL RADIAL FRACTURE;  Surgeon: Dairl Ponder, MD;  Location: [facility] OR;  Service: Orthopedics;  Laterality: Left;   SPINAL FUSION     UPPER GI ENDOSCOPY      Family History  Problem Relation Age of Onset   Heart disease Mother    Asthma Father    Heart disease Brother 104       CAD    Social History   Socioeconomic History   Marital status: Widowed    Spouse name: Not on file   Number of children: 2   Years of education: Not on file   Highest education level: Not on file  Occupational History   Not on file  Tobacco Use   Smoking status: Never   Smokeless tobacco: Never  Vaping Use   Vaping Use: Never used  Substance and Sexual Activity   Alcohol use: Never   Drug use: Never   Sexual activity: Not on file  Other Topics Concern   Not on file  Social History Narrative   Not on file   Social Determinants of Health   Financial Resource Strain: Not on file  Food Insecurity: Not on file  Transportation Needs: Not on file  Physical Activity: Not on file  Stress: Not on file  Social Connections: Not on file  Intimate Partner Violence: Not on file    Review of Systems  Constitutional:  Positive for fatigue.  HENT: Negative.    Eyes: Negative.   Respiratory:  Positive for apnea, shortness of breath and wheezing. Negative for cough.   Cardiovascular:  Negative for chest pain.       Chest wall  discomfort  Gastrointestinal: Negative.   Endocrine: Negative.   Musculoskeletal:  Positive for arthralgias.  Psychiatric/Behavioral:  Positive for sleep disturbance.   All other systems reviewed and are negative.   Vitals:   03/19/23 1109  BP: 118/68  Pulse: 65  SpO2: 98%   Physical Exam Constitutional:      Appearance: She is well-developed. She is obese.     Comments: Overweight  HENT:     Head: Normocephalic and atraumatic.     Mouth/Throat:     Mouth: Mucous membranes  are moist.  Eyes:     General: No scleral icterus. Neck:     Thyroid: No thyromegaly.     Trachea: No tracheal deviation.  Cardiovascular:     Rate and Rhythm: Normal rate and regular rhythm.  Pulmonary:     Effort: Pulmonary effort is normal. No respiratory distress.     Breath sounds: No wheezing or rales.     Comments: Decreased AE bilaterally Chest:     Chest wall: No tenderness.  Musculoskeletal:     Cervical back: No rigidity or tenderness.  Neurological:     Mental Status: She is alert.     Comments: Uses a walker  Psychiatric:        Mood and Affect: Mood normal.    Data Reviewed: CT of the chest reviewed showing midlung atelectasis-unchanged from previous -Reviewed by myself  Echocardiogram with evidence of severe pulmonary hypertension, diastolic dysfunction -This is likely related to obstructive sleep apnea, chronic hypoxemia, diastolic dysfunction  CPAP compliance reviewed showing excellent compliance with CPAP of 93% Residual AHI of 2.5 Auto CPAP of 5-15  Assessment:   .  Obstructive sleep apnea -Appears adequately treated with CPAP therapy -Encouraged to continue to use CPAP on a nightly basis trying to get about 6 to 8 hours of sleep every night  .  Nocturnal desaturations -Continue to use CPAP nightly  .  Chronic cough -Continue Tessalon Perles as needed  History of pulmonary hypertension -Multifactorial -Likely related to chronic hypoxemia -Continue CPAP  nightly  .  Coronary artery disease -Does follow-up with cardiology  Shortness of breath -This is multifactorial -With significant smoking exposure in the past -Obtain chest x-ray today, schedule for pulmonary function test  -Will trial Trelegy as patient has been using albuterol very frequently according to daughter  Atelectasis -Stable on previous CT follow-ups   Plan/Recommendations:  Continue CPAP nightly  Continue oxygen supplementation  Will change from Breo to Trelegy 100-prescription for Trelegy sent into pharmacy  Continue albuterol as needed  Will obtain a chest x-ray today  Schedule for pulmonary function test  Follow-up in 3 months  Virl DiamondAdewale Erin Uecker MD [facility] 03/19/2023, 11:24 AM  CC: [facility]

## 2023-03-19 NOTE — Patient Instructions (Addendum)
We will schedule you for chest x-ray today  Schedule for PFT to be done at the time of next visit  Follow-up in 3 months  Continue using current inhalers  I will call your daughter to discuss the inhalers

## 2023-04-27 ENCOUNTER — Ambulatory Visit (HOSPITAL_BASED_OUTPATIENT_CLINIC_OR_DEPARTMENT_OTHER): Payer: Medicare (Managed Care) | Admitting: Cardiology

## 2023-06-15 ENCOUNTER — Ambulatory Visit (HOSPITAL_BASED_OUTPATIENT_CLINIC_OR_DEPARTMENT_OTHER): Payer: Medicare (Managed Care) | Admitting: Family

## 2023-06-15 ENCOUNTER — Encounter (HOSPITAL_BASED_OUTPATIENT_CLINIC_OR_DEPARTMENT_OTHER): Payer: Self-pay | Admitting: Family

## 2023-06-15 VITALS — BP 104/78 | HR 62 | Ht 59.0 in | Wt 179.0 lb

## 2023-06-15 DIAGNOSIS — I10 Essential (primary) hypertension: Secondary | ICD-10-CM

## 2023-06-15 DIAGNOSIS — I25118 Atherosclerotic heart disease of native coronary artery with other forms of angina pectoris: Secondary | ICD-10-CM | POA: Diagnosis not present

## 2023-06-15 DIAGNOSIS — I272 Pulmonary hypertension, unspecified: Secondary | ICD-10-CM

## 2023-06-15 DIAGNOSIS — E785 Hyperlipidemia, unspecified: Secondary | ICD-10-CM | POA: Diagnosis not present

## 2023-06-15 NOTE — Patient Instructions (Signed)
Medication Instructions:  Your physician recommends that you continue on your current medications as directed. Please refer to the Current Medication list given to you today.  *If you need a refill on your cardiac medications before your next appointment, please call your pharmacy*  Follow-Up: At [facility], you and your health needs are our priority.  As part of our continuing mission to provide you with exceptional heart care, we have created designated Provider Care Teams.  These Care Teams include your primary Cardiologist (physician) and Advanced Practice Providers (APPs -  Physician Assistants and Nurse Practitioners) who all work together to provide you with the care you need, when you need it.  We recommend signing up for the patient portal called "MyChart".  Sign up information is provided on this After Visit Summary.  MyChart is used to connect with patients for Virtual Visits (Telemedicine).  Patients are able to view lab/test results, encounter notes, upcoming appointments, etc.  Non-urgent messages can be sent to your provider as well.   To learn more about what you can do with MyChart, go to [email or web address].    Your next appointment:   6 month(s)  Provider:   Bridgette Christopher, MD    

## 2023-06-15 NOTE — Progress Notes (Signed)
Cardiology Office Note:  .   Date:  06/15/2023  ID:  Jennifer [name], DOB [birth year], MRN [id number] PCP: Inc, [facility] Providers Cardiologist:  Jodelle Red, MD    History of Present Illness: Jennifer [name] is a 89 y.o. female CAD (2009 PCI-LAD), hypertension, hyperlipidemia, sleep apnea, GERD with hiatal hernia/esophageal stricture, pulmonary hypertension with chronic hypoxemic respiratory failure.  She was last in 10/23/2022 by Dr. Cristal Deer.  In 2019 she had PCI-LAD in [place].  Repeat cath in 2014 with patent stent, jailed diagonal without intervention.  Echo 2018 normal LVEF, RVSP PG 26 mmHg.  Myoview after moving to [facility] 06/2018 breast attenuation but no ischemia.  Echo 06/2018 normal LVEF with pulmonary hypertension (PASP 55).  08/2018 Ranexa dose reduced due to dizziness.  She noted palpitations 09/2018 with 30-day event monitor with no significant arrhythmias.  Noted prior difficulties with beta-blocker.  At last visit 10/23/2022 she was doing well and no changes were made. She saw pulmonology 03/19/23 and was started on Trelegy.   Presents today for follow-up with her daughter.  Dyspnea much improved with Trelegy. Has not needed PRN Albuterol.  She does note exertional dyspnea but it is overall improved.  Occasional orthopnea without PND.  No lower extremity edema.  She has had a weight loss of 15 pounds since last clinic visit through following a healthier diet.  She is participating in PACE program twice per week.  Reports no chest pain, pressure, tightness.  Occasional sharp left arm pain and reassurance provided this was atypical for angina.  ROS: Please see the history of present illness.    All other systems reviewed and are negative.   Studies Reviewed: Marland Kitchen   EKG Interpretation Date/Time:  Monday June 15 2023 08:57:43 EDT Ventricular Rate:  62 PR Interval:  158 QRS Duration:  82 QT Interval:  424 QTC  Calculation: 430 R Axis:   32  Text Interpretation: Normal sinus rhythm Normal ECG Confirmed by Gillian Shields ([id number]) on 06/15/2023 9:13:27 AM    Cardiac Studies & Procedures   CARDIAC CATHETERIZATION  CARDIAC CATHETERIZATION 08/23/2018   CARDIAC CATHETERIZATION  CARDIAC CATHETERIZATION 08/02/2018   STRESS TESTS  MYOCARDIAL PERFUSION IMAGING 06/17/2018  Narrative  The left ventricular ejection fraction is hyperdynamic (>65%).  Nuclear stress EF: 71%.  There was no ST segment deviation noted during stress.  There is a small defect of mild severity present in the basal anteroseptal, mid anteroseptal and apical septal location. The defect is non-reversible that is consistent with breast attenuation artifact. No ischemia noted.  This is a low risk study.   ECHOCARDIOGRAM  ECHOCARDIOGRAM COMPLETE 02/25/2023  Narrative ECHOCARDIOGRAM REPORT    Patient Name:   Jennifer [name] Date of Exam: 02/25/2023 Medical Rec #:  [id number]       Height:       58.0 in Accession #:    [id number]      Weight:       194.0 lb Date of Birth:  Aug 16, 1934       BSA:          1.798 m Patient Age:    88 years        BP:           136/78 mmHg Patient Gender: F               HR:           76 bpm. Exam Location:  Outpatient  Procedure: 2D Echo, 3D Echo, Color Doppler, Cardiac Doppler and Strain Analysis  Indications:    Pulmonary Hypertension  History:        Patient has prior history of Echocardiogram examinations, most recent 09/28/2020. CAD, Signs/Symptoms:Dyspnea; Risk Factors:Hypertension, Dyslipidemia and Non-Smoker.  Sonographer:    Jeryl Columbia RDCS Referring Phys: [phone] Lennart Pall MILLER  IMPRESSIONS   1. Left ventricular ejection fraction, by estimation, is 60 to 65%. The left ventricle has normal function. The left ventricle has no regional wall motion abnormalities. Left ventricular diastolic parameters were normal. The average left ventricular global longitudinal strain is  -19.6 %. The global longitudinal strain is normal. 2. Right ventricular systolic function is normal. The right ventricular size is normal. 3. The mitral valve is abnormal. Trivial mitral valve regurgitation. No evidence of mitral stenosis. Moderate mitral annular calcification. 4. Mean gradient 8 mmHg calculated AVA 2.1 cm2. The aortic valve is tricuspid. There is moderate calcification of the aortic valve. There is moderate thickening of the aortic valve. Aortic valve regurgitation is not visualized. Aortic valve sclerosis/calcification is present, without any evidence of aortic stenosis. 5. The inferior vena cava is dilated in size with >50% respiratory variability, suggesting right atrial pressure of 8 mmHg.  FINDINGS Left Ventricle: Left ventricular ejection fraction, by estimation, is 60 to 65%. The left ventricle has normal function. The left ventricle has no regional wall motion abnormalities. The average left ventricular global longitudinal strain is -19.6 %. The global longitudinal strain is normal. The left ventricular internal cavity size was normal in size. There is no left ventricular hypertrophy. Left ventricular diastolic parameters were normal.  Right Ventricle: The right ventricular size is normal. No increase in right ventricular wall thickness. Right ventricular systolic function is normal.  Left Atrium: Left atrial size was normal in size.  Right Atrium: Right atrial size was normal in size.  Pericardium: There is no evidence of pericardial effusion.  Mitral Valve: The mitral valve is abnormal. There is mild thickening of the mitral valve leaflet(s). There is mild calcification of the mitral valve leaflet(s). Moderate mitral annular calcification. Trivial mitral valve regurgitation. No evidence of mitral valve stenosis.  Tricuspid Valve: The tricuspid valve is normal in structure. Tricuspid valve regurgitation is not demonstrated. No evidence of tricuspid stenosis.  Aortic  Valve: Mean gradient 8 mmHg calculated AVA 2.1 cm2. The aortic valve is tricuspid. There is moderate calcification of the aortic valve. There is moderate thickening of the aortic valve. Aortic valve regurgitation is not visualized. Aortic valve sclerosis/calcification is present, without any evidence of aortic stenosis. Aortic valve mean gradient measures 8.0 mmHg. Aortic valve peak gradient measures 14.4 mmHg. Aortic valve area, by VTI measures 2.05 cm.  Pulmonic Valve: The pulmonic valve was normal in structure. Pulmonic valve regurgitation is not visualized. No evidence of pulmonic stenosis.  Aorta: The aortic root is normal in size and structure.  Venous: The inferior vena cava is dilated in size with greater than 50% respiratory variability, suggesting right atrial pressure of 8 mmHg.  IAS/Shunts: The interatrial septum appears to be lipomatous. No atrial level shunt detected by color flow Doppler.   LEFT VENTRICLE PLAX 2D LVIDd:         3.85 cm   Diastology LVIDs:         2.05 cm   LV e' medial:    6.31 cm/s LV PW:         1.18 cm   LV E/e' medial:  14.5 LV IVS:  1.07 cm   LV e' lateral:   9.03 cm/s LVOT diam:     2.00 cm   LV E/e' lateral: 10.1 LV SV:         80 LV SV Index:   45        2D Longitudinal Strain LVOT Area:     3.14 cm  2D Strain GLS Avg:     -19.6 %  3D Volume EF: 3D EF:        63 % LV EDV:       83 ml LV ESV:       31 ml LV SV:        53 ml  RIGHT VENTRICLE RV Basal diam:  3.67 cm RV Mid diam:    2.20 cm RV S prime:     12.30 cm/s TAPSE (M-mode): 2.7 cm  LEFT ATRIUM             Index        RIGHT ATRIUM           Index LA diam:        3.10 cm 1.72 cm/m   RA Area:     17.00 cm LA Vol (A2C):   36.8 ml 20.47 ml/m  RA Volume:   49.30 ml  27.42 ml/m LA Vol (A4C):   44.3 ml 24.64 ml/m LA Biplane Vol: 40.6 ml 22.58 ml/m AORTIC VALVE AV Area (Vmax):    1.90 cm AV Area (Vmean):   1.92 cm AV Area (VTI):     2.05 cm AV Vmax:           190.00  cm/s AV Vmean:          128.000 cm/s AV VTI:            0.391 m AV Peak Grad:      14.4 mmHg AV Mean Grad:      8.0 mmHg LVOT Vmax:         115.00 cm/s LVOT Vmean:        78.100 cm/s LVOT VTI:          0.255 m LVOT/AV VTI ratio: 0.65  AORTA Ao Root diam: 2.90 cm Ao Asc diam:  3.50 cm  MITRAL VALVE               TRICUSPID VALVE MV Area (PHT): 3.85 cm    TR Peak grad:   27.2 mmHg MV Decel Time: 197 msec    TR Vmax:        261.00 cm/s MR Peak grad: 51.8 mmHg MR Vmax:      360.00 cm/s  SHUNTS MV E velocity: 91.20 cm/s  Systemic VTI:  0.26 m MV A velocity: 77.60 cm/s  Systemic Diam: 2.00 cm MV E/A ratio:  1.18  Charlton Haws MD Electronically signed by Charlton Haws MD Signature Date/Time: 02/25/2023/2:09:17 PM    Final    MONITORS  CARDIAC EVENT MONITOR 10/21/2018  Narrative  The patient was enrolled for 30 days. 60% of the monitoring period yielded diagnostic tracings.  The predominant rhythm was sinus with an average rate of 72 bpm (range 51 to 125 bpm).  Isolated PACs and PVCs were noted.  No sustained arrhythmia or prolonged pause was identified.  Predominantly sinus rhythm with isolated PACs and PVCs.  No sustained arrhythmia.           Risk Assessment/Calculations:             Physical Exam:   VS:  BP 104/78   Pulse 62   Ht 4\' 11"  (1.499 m)   Wt 179 lb (81.2 kg)   LMP  (LMP Unknown)   BMI 36.15 kg/m    Wt Readings from Last 3 Encounters:  06/15/23 179 lb (81.2 kg)  03/19/23 183 lb (83 kg)  10/23/22 194 lb (88 kg)    GEN: Well nourished, well developed in no acute distress NECK: No JVD; No carotid bruits CARDIAC: RRR, no murmurs, rubs, gallops RESPIRATORY:  Clear to auscultation without rales, wheezing or rhonchi  ABDOMEN: Soft, non-tender, non-distended EXTREMITIES:  No edema; No deformity   ASSESSMENT AND PLAN: .    CAD-2009 PCI-LAD. Stable with no anginal symptoms. No indication for ischemic evaluation.  GDMT includes aspirin, Ranexa,  pravastatin.  Previous intolerance to beta-blocker.  Of note Ranexa dose previously reduced due to dizziness. Heart healthy diet and regular cardiovascular exercise encouraged.    HLD, LDL goal less than 70- Continue pravastatin. Labs monitored by PCP.   Pulmonary hypertension/chronic hypoxemic respiratory failure/chronic dyspnea on exertion- Pulmonary hypertension likely related to chronic hypoxemia. Follows with Dr. Wynona Neat. Dyspnea improved since Trelegy and no longer requiring PRN Albuterol.   HTN- BP well controlled. Continue current antihypertensive regimen.  Relatively hypotensive but asymptomatic with no lightheadedness, dizziness.   LE edema - Resolved on Torsemide 20mg  BID.   OSA - CPAP compliance encouraged. Follows with Dr. Wynona Neat of pulmonology.        Dispo: follow up in 6 months with Dr. Cristal Deer  Signed, Alver Sorrow, NP

## 2023-07-29 ENCOUNTER — Ambulatory Visit: Payer: Medicare (Managed Care) | Admitting: Pulmonary Disease

## 2023-08-04 ENCOUNTER — Ambulatory Visit: Payer: Medicare (Managed Care) | Admitting: Pulmonary Disease

## 2023-08-12 ENCOUNTER — Encounter: Payer: Self-pay | Admitting: Pulmonary Disease

## 2023-08-12 ENCOUNTER — Ambulatory Visit (INDEPENDENT_AMBULATORY_CARE_PROVIDER_SITE_OTHER): Payer: Medicare (Managed Care) | Admitting: Pulmonary Disease

## 2023-08-12 ENCOUNTER — Ambulatory Visit: Payer: Medicare (Managed Care) | Admitting: Pulmonary Disease

## 2023-08-12 VITALS — BP 118/62 | HR 60 | Temp 97.2°F | Ht <= 58 in | Wt 181.8 lb

## 2023-08-12 DIAGNOSIS — R0602 Shortness of breath: Secondary | ICD-10-CM | POA: Diagnosis not present

## 2023-08-12 DIAGNOSIS — I272 Pulmonary hypertension, unspecified: Secondary | ICD-10-CM | POA: Diagnosis not present

## 2023-08-12 DIAGNOSIS — G4733 Obstructive sleep apnea (adult) (pediatric): Secondary | ICD-10-CM | POA: Diagnosis not present

## 2023-08-12 LAB — PULMONARY FUNCTION TEST
DL/VA % pred: 69 %
DL/VA: 2.95 ml/min/mmHg/L
DLCO cor % pred: 67 %
DLCO cor: 9.66 ml/min/mmHg
DLCO unc % pred: 67 %
DLCO unc: 9.66 ml/min/mmHg
FEF 25-75 Post: 1.37 L/s
FEF 25-75 Pre: 1.52 L/s
FEF2575-%Change-Post: -9 %
FEF2575-%Pred-Post: 207 %
FEF2575-%Pred-Pre: 230 %
FEV1-%Change-Post: 0 %
FEV1-%Pred-Post: 107 %
FEV1-%Pred-Pre: 107 %
FEV1-Post: 1.14 L
FEV1-Pre: 1.14 L
FEV1FVC-%Change-Post: 0 %
FEV1FVC-%Pred-Pre: 118 %
FEV6-%Change-Post: -1 %
FEV6-%Pred-Post: 96 %
FEV6-%Pred-Pre: 97 %
FEV6-Post: 1.31 L
FEV6-Pre: 1.33 L
FEV6FVC-%Change-Post: 0 %
FEV6FVC-%Pred-Post: 108 %
FEV6FVC-%Pred-Pre: 109 %
FVC-%Change-Post: -1 %
FVC-%Pred-Post: 88 %
FVC-%Pred-Pre: 89 %
FVC-Post: 1.32 L
FVC-Pre: 1.33 L
Post FEV1/FVC ratio: 86 %
Post FEV6/FVC ratio: 100 %
Pre FEV1/FVC ratio: 86 %
Pre FEV6/FVC Ratio: 100 %
RV % pred: 65 %
RV: 1.42 L
TLC % pred: 80 %
TLC: 3.24 L

## 2023-08-12 NOTE — Patient Instructions (Addendum)
I will see you in about 6 months  Continue using your CPAP nightly  Continue using your breathing treatments  Graded activities as tolerated  Call us with significant concerns  DME referral for new machine-auto CPAP 5-15

## 2023-08-12 NOTE — Progress Notes (Signed)
Jennifer [name]    [id number]    06-10-1934  Primary Care Physician:Inc, [facility]  Referring Physician: Inc, [facility] [address],  [place] [address]  Chief complaint:    Follow-up obstructive sleep apnea Has underlying pulmonary hypertension, multifactorial shortness of breath  HPI:  Continues to do relatively well  Continues to use CPAP nightly -Benefiting from CPAP use - She states that sometimes the machine does not turn on -Machine is over 61 years old according to patient  She uses oxygen around-the-clock and benefits from its use  Occasional chest discomfort  Limited with activities  Shortness of breath is stable not coughing much  History of large hiatal hernia, history of atelectasis, history of pulmonary hypertension  She does use a walker for ambulation Exercise tolerance still limited but she does try to stay active  She is on oxygen supplementation around-the-clock -Compliant with oxygen use  History of coronary artery disease History of pulmonary hypertension  Previous history Patient recently moved here from [place], did speak with Dr. Avie Arenas regarding a CT Repeat CT significant for some atelectasis She has a history of moderate pulmonary hypertension-echo did confirm moderate pulmonary hypertension-2019  Her daughter states she uses albuterol very frequently and wonders whether she may benefit from a trial with other inhalers  Was exposed to a lot of secondhand smoke in the past , Has had a PFT in the past but none recently   Outpatient Encounter Medications as of 08/12/2023  Medication Sig   acetaminophen (TYLENOL) 650 MG CR tablet Take 650 mg by mouth 2 (two) times a day.   albuterol (VENTOLIN HFA) 108 (90 Base) MCG/ACT inhaler Inhale 2 puffs into the lungs every 6 (six) hours as needed for wheezing or shortness of breath.   aspirin EC 81 MG tablet Take 81 mg by  mouth daily. CARDIAC EVENT PREVENTION   benzonatate (TESSALON) 200 MG capsule Take 1 capsule (200 mg total) by mouth 3 (three) times daily as needed for cough.   Cholecalciferol (VITAMIN D3) 5000 units TABS Take 5,000 Units by mouth daily with breakfast.    enalapril (VASOTEC) 20 MG tablet Take 20 mg by mouth daily.   escitalopram (LEXAPRO) 5 MG tablet Take 5 mg by mouth daily.   Fluticasone-Umeclidin-Vilant (TRELEGY ELLIPTA) 100-62.5-25 MCG/ACT AEPB Inhale 1 puff into the lungs daily.   ibuprofen (ADVIL) 200 MG tablet Take 600 mg by mouth daily as needed.   levothyroxine (SYNTHROID, LEVOTHROID) 50 MCG tablet Take 50 mcg by mouth daily before breakfast.   mirtazapine (REMERON) 7.5 MG tablet Take 7.5 mg by mouth at bedtime.   Multiple Vitamins-Minerals (PRESERVISION AREDS 2) CAPS Take 1 tablet by mouth daily.   nitroGLYCERIN (NITROSTAT) 0.4 MG SL tablet Place 0.4 mg under the tongue every 5 (five) minutes as needed for chest pain.   pantoprazole (PROTONIX) 20 MG tablet Take 20 mg by mouth daily.   pravastatin (PRAVACHOL) 80 MG tablet Take 80 mg by mouth every evening.   ranolazine (RANEXA) 500 MG 12 hr tablet Take 500 mg by mouth 2 (two) times daily.   Spacer/Aero-Holding Chambers (AEROCHAMBER MV) inhaler Use as instructed   traMADol (ULTRAM) 50 MG tablet Take 50 mg by mouth daily as needed for moderate pain.   torsemide (DEMADEX) 20 MG tablet Take 1 tablet (20 mg total) by mouth 2 (two) times daily.   No facility-administered encounter medications on file as of  08/12/2023.    Allergies as of 08/12/2023   (No Known Allergies)    Past Medical History:  Diagnosis Date   Ambulates with cane    straight   At risk for falls    CAD (coronary artery disease)    Cataracts, bilateral    GERD (gastroesophageal reflux disease)    Hypercholesterolemia    Hypertension    Hypothyroidism    Major depression    Musculoskeletal back pain    CHRONIC LEFT-SIDED DISCOMFORT   Obstructive sleep apnea  on CPAP    uses CPAP nightly   Osteopenia    PVD (peripheral vascular disease) (HCC)    Stable angina    SVD (spontaneous vaginal delivery)    x 2   Vitamin D deficiency    Wears glasses    Wears partial dentures    upper    Past Surgical History:  Procedure Laterality Date   ANKLE SURGERY Left    BACK SURGERY     CARDIAC CATHETERIZATION  06/2008   PCI to LAD with 3.0 x 12 mm DES   COLONOSCOPY     EYE SURGERY     remove cataracts - bilateral   MULTIPLE TOOTH EXTRACTIONS     upper teeth - upper dentures   OPEN REDUCTION INTERNAL FIXATION (ORIF) DISTAL RADIAL FRACTURE Left 05/16/2019   Procedure: OPEN REDUCTION INTERNAL FIXATION (ORIF) DISTAL RADIAL FRACTURE;  Surgeon: Dairl Ponder, MD;  Location: [facility] OR;  Service: Orthopedics;  Laterality: Left;   SPINAL FUSION     UPPER GI ENDOSCOPY      Family History  Problem Relation Age of Onset   Heart disease Mother    Asthma Father    Heart disease Brother 10       CAD    Social History   Socioeconomic History   Marital status: Widowed    Spouse name: Not on file   Number of children: 2   Years of education: Not on file   Highest education level: Not on file  Occupational History   Not on file  Tobacco Use   Smoking status: Never   Smokeless tobacco: Never  Vaping Use   Vaping status: Never Used  Substance and Sexual Activity   Alcohol use: Never   Drug use: Never   Sexual activity: Not on file  Other Topics Concern   Not on file  Social History Narrative   Not on file   Social Determinants of Health   Financial Resource Strain: Not on file  Food Insecurity: Not on file  Transportation Needs: Not on file  Physical Activity: Not on file  Stress: Not on file  Social Connections: Not on file  Intimate Partner Violence: Not on file    Review of Systems  Constitutional:  Positive for fatigue.  HENT: Negative.    Eyes: Negative.   Respiratory:  Positive for apnea, shortness of breath and wheezing.  Negative for cough.   Cardiovascular:  Negative for chest pain.       Chest wall discomfort  Gastrointestinal: Negative.   Endocrine: Negative.   Musculoskeletal:  Positive for arthralgias.  Psychiatric/Behavioral:  Positive for sleep disturbance.   All other systems reviewed and are negative.   Vitals:   08/12/23 1000  BP: (!) 188/62  Pulse: 60  Temp: (!) 97.2 F (36.2 C)  SpO2: 100%   Physical Exam Constitutional:      Appearance: She is well-developed. She is obese.     Comments: Overweight  HENT:  Head: Normocephalic and atraumatic.     Mouth/Throat:     Mouth: Mucous membranes are moist.  Eyes:     General: No scleral icterus. Neck:     Thyroid: No thyromegaly.     Trachea: No tracheal deviation.  Cardiovascular:     Rate and Rhythm: Normal rate and regular rhythm.  Pulmonary:     Effort: Pulmonary effort is normal. No respiratory distress.     Breath sounds: No wheezing or rales.     Comments: Decreased AE bilaterally Chest:     Chest wall: No tenderness.  Musculoskeletal:     Cervical back: No rigidity or tenderness.  Neurological:     Mental Status: She is alert.     Comments: Uses a walker  Psychiatric:        Mood and Affect: Mood normal.    Data Reviewed: CT of the chest reviewed showing midlung atelectasis-unchanged from previous -Reviewed by myself  Echocardiogram with evidence of severe pulmonary hypertension, diastolic dysfunction -This is likely related to obstructive sleep apnea, chronic hypoxemia, diastolic dysfunction  Excellent compliance with CPAP at 99% AutoSet 5-15 Residual AHI of 2.4  PFT reviewed showing no significant obstruction, no significant bronchodilator response, no restriction, mildly reduced diffusing capacity  Assessment:   .  Obstructive sleep apnea -Appears to be benefiting from CPAP therapy Continues to use CPAP on a nightly basis  .  Nocturnal desaturations -Continue CPAP nightly  Chronic cough -Much  improved  History of pulmonary hypertension -Multifactorial likely related to chronic hypoxemia -Continue CPAP nightly  .  Coronary artery disease -Does follow-up with cardiology  Shortness of breath -Multifactorial -Continue to monitor  -Continue Trelegy Atelectasis -Stable on previous CT follow-ups   Plan/Recommendations:  New CPAP order auto CPAP 5-15 with heated humidification with patient's mask of choice Dated CPAP machine -New order for auto CPAP  Continue Trelegy  Continue albuterol  Continue graded activities as tolerated  Follow-up in 6 months  Virl Diamond MD [facility] 08/12/2023, 10:23 AM  CC: Inc, [employer]*

## 2023-08-12 NOTE — Addendum Note (Signed)
Addended by: Lanna Poche on: 08/12/2023 11:32 AM   Modules accepted: Orders

## 2023-08-12 NOTE — Progress Notes (Signed)
Full PFT performed today. °

## 2023-08-12 NOTE — Patient Instructions (Signed)
Full PFT performed today. °

## 2023-12-31 ENCOUNTER — Ambulatory Visit (HOSPITAL_BASED_OUTPATIENT_CLINIC_OR_DEPARTMENT_OTHER): Payer: Medicare (Managed Care) | Admitting: Cardiology

## 2024-04-05 ENCOUNTER — Ambulatory Visit (HOSPITAL_BASED_OUTPATIENT_CLINIC_OR_DEPARTMENT_OTHER): Payer: Medicare (Managed Care) | Admitting: Cardiology

## 2024-07-14 ENCOUNTER — Ambulatory Visit (INDEPENDENT_AMBULATORY_CARE_PROVIDER_SITE_OTHER): Payer: Medicare (Managed Care) | Admitting: Pulmonary Disease

## 2024-07-14 VITALS — BP 138/84 | HR 75 | Ht <= 58 in | Wt 189.0 lb

## 2024-07-14 DIAGNOSIS — I272 Pulmonary hypertension, unspecified: Secondary | ICD-10-CM

## 2024-07-14 DIAGNOSIS — R0602 Shortness of breath: Secondary | ICD-10-CM | POA: Diagnosis not present

## 2024-07-14 DIAGNOSIS — G4733 Obstructive sleep apnea (adult) (pediatric): Secondary | ICD-10-CM | POA: Diagnosis not present

## 2024-07-14 NOTE — Progress Notes (Signed)
 Jennifer [name]    [id number]    1934/08/13  Primary Care Physician:Reed, Annabella CROME, DO  Referring Physician: Inc, [facility] [address] [place],  [place] [address]  Chief complaint:    Follow-up obstructive sleep apnea Has underlying pulmonary hypertension, multifactorial shortness of breath  HPI:  Continues to do relatively well  She continues to have some chest pain and discomfort, discomfort in her shoulder especially on the left side  Continues to use CPAP nightly, benefiting from CPAP use Has been tolerating CPAP well  Limited with activities of daily living  Does have musculoskeletal pain or discomfort Does have leg swelling that is persistent  Gets around with a walker  She does have occasional coughing  History of large hiatal hernia, history of atelectasis, history of pulmonary hypertension  She is on oxygen supplementation around-the-clock -Compliant with oxygen use  History of coronary artery disease History of pulmonary hypertension  Previous history Patient recently moved here from [place] , did speak with Dr. Kirtane regarding a CT Repeat CT significant for some atelectasis She has a history of moderate pulmonary hypertension-echo did confirm moderate pulmonary hypertension-2019  Her daughter states she uses albuterol  very frequently and wonders whether she may benefit from a trial with other inhalers  Was exposed to a lot of secondhand smoke in the past , Has had a PFT in the past but none recently   Outpatient Encounter Medications as of 07/14/2024  Medication Sig   acetaminophen  (TYLENOL ) 650 MG CR tablet Take 650 mg by mouth 2 (two) times a day.   albuterol  (VENTOLIN  HFA) 108 (90 Base) MCG/ACT inhaler Inhale 2 puffs into the lungs every 6 (six) hours as needed for wheezing or shortness of breath.   aspirin EC 81 MG tablet Take 81 mg by mouth daily. CARDIAC EVENT PREVENTION   benzonatate  (TESSALON ) 200  MG capsule Take 1 capsule (200 mg total) by mouth 3 (three) times daily as needed for cough.   Cholecalciferol (VITAMIN D3) 5000 units TABS Take 5,000 Units by mouth daily with breakfast.    enalapril (VASOTEC) 20 MG tablet Take 20 mg by mouth daily.   escitalopram (LEXAPRO) 5 MG tablet Take 5 mg by mouth daily.   Fluticasone -Umeclidin-Vilant (TRELEGY ELLIPTA ) 100-62.5-25 MCG/ACT AEPB Inhale 1 puff into the lungs daily.   ibuprofen (ADVIL) 200 MG tablet Take 600 mg by mouth daily as needed.   levothyroxine (SYNTHROID, LEVOTHROID) 50 MCG tablet Take 50 mcg by mouth daily before breakfast.   mirtazapine (REMERON) 7.5 MG tablet Take 7.5 mg by mouth at bedtime.   Multiple Vitamins-Minerals (PRESERVISION AREDS 2) CAPS Take 1 tablet by mouth daily.   nitroGLYCERIN (NITROSTAT) 0.4 MG SL tablet Place 0.4 mg under the tongue every 5 (five) minutes as needed for chest pain.   pantoprazole (PROTONIX) 20 MG tablet Take 20 mg by mouth daily.   pravastatin (PRAVACHOL) 80 MG tablet Take 80 mg by mouth every evening.   ranolazine  (RANEXA ) 500 MG 12 hr tablet Take 500 mg by mouth 2 (two) times daily.   Spacer/Aero-Holding Chambers (AEROCHAMBER MV) inhaler Use as instructed   torsemide  (DEMADEX ) 20 MG tablet Take 1 tablet (20 mg total) by mouth 2 (two) times daily.   traMADol (ULTRAM) 50 MG tablet Take 50 mg by mouth daily as needed for moderate pain.   No facility-administered encounter medications on file as of 07/14/2024.    Allergies as of 07/14/2024   (No Known  Allergies)    Past Medical History:  Diagnosis Date   Ambulates with cane    straight   At risk for falls    CAD (coronary artery disease)    Cataracts, bilateral    GERD (gastroesophageal reflux disease)    Hypercholesterolemia    Hypertension    Hypothyroidism    Major depression    Musculoskeletal back pain    CHRONIC LEFT-SIDED DISCOMFORT   Obstructive sleep apnea on CPAP    uses CPAP nightly   Osteopenia    PVD (peripheral  vascular disease) (HCC)    Stable angina    SVD (spontaneous vaginal delivery)    x 2   Vitamin D deficiency    Wears glasses    Wears partial dentures    upper    Past Surgical History:  Procedure Laterality Date   ANKLE SURGERY Left    BACK SURGERY     CARDIAC CATHETERIZATION  06/2008   PCI to LAD with 3.0 x 12 mm DES   COLONOSCOPY     EYE SURGERY     remove cataracts - bilateral   MULTIPLE TOOTH EXTRACTIONS     upper teeth - upper dentures   OPEN REDUCTION INTERNAL FIXATION (ORIF) DISTAL RADIAL FRACTURE Left 05/16/2019   Procedure: OPEN REDUCTION INTERNAL FIXATION (ORIF) DISTAL RADIAL FRACTURE;  Surgeon: Sissy Cough, MD;  Location: [facility] OR;  Service: Orthopedics;  Laterality: Left;   SPINAL FUSION     UPPER GI ENDOSCOPY      Family History  Problem Relation Age of Onset   Heart disease Mother    Asthma Father    Heart disease Brother 43       CAD    Social History   Socioeconomic History   Marital status: Widowed    Spouse name: Not on file   Number of children: 2   Years of education: Not on file   Highest education level: Not on file  Occupational History   Not on file  Tobacco Use   Smoking status: Never   Smokeless tobacco: Never  Vaping Use   Vaping status: Never Used  Substance and Sexual Activity   Alcohol use: Never   Drug use: Never   Sexual activity: Not on file  Other Topics Concern   Not on file  Social History Narrative   Not on file   Social Drivers of Health   Financial Resource Strain: Not on file  Food Insecurity: Not on file  Transportation Needs: Not on file  Physical Activity: Not on file  Stress: Not on file  Social Connections: Not on file  Intimate Partner Violence: Not on file    Review of Systems  Constitutional:  Positive for fatigue.  HENT: Negative.    Eyes: Negative.   Respiratory:  Positive for apnea and shortness of breath. Negative for cough and wheezing.   Cardiovascular:  Negative for chest pain.        Chest wall discomfort  Gastrointestinal: Negative.   Endocrine: Negative.   Musculoskeletal:  Positive for arthralgias.  Psychiatric/Behavioral:  Positive for sleep disturbance.   All other systems reviewed and are negative.   Vitals:   07/14/24 1123  BP: 138/84  Pulse: 75  SpO2: 98%   Physical Exam Constitutional:      Appearance: She is well-developed. She is obese.     Comments: Overweight  HENT:     Head: Normocephalic and atraumatic.     Mouth/Throat:     Mouth: Mucous membranes  are moist.  Eyes:     General: No scleral icterus. Neck:     Thyroid: No thyromegaly.     Trachea: No tracheal deviation.  Cardiovascular:     Rate and Rhythm: Normal rate and regular rhythm.  Pulmonary:     Effort: Pulmonary effort is normal. No respiratory distress.     Breath sounds: No wheezing or rales.     Comments: Decreased AE bilaterally Chest:     Chest wall: No tenderness.  Musculoskeletal:     Cervical back: No rigidity or tenderness.     Right lower leg: Edema present.     Left lower leg: Edema present.  Neurological:     Mental Status: She is alert.     Comments: Uses a walker  Psychiatric:        Mood and Affect: Mood normal.    Data Reviewed: CT of the chest reviewed showing midlung atelectasis-unchanged from previous -Reviewed by myself  Echocardiogram with evidence of severe pulmonary hypertension, diastolic dysfunction -This is likely related to obstructive sleep apnea, chronic hypoxemia, diastolic dysfunction  Excellent compliance with average use of 6 hours AutoSet 5-15 AHI 1.4  PFT reviewed showing no significant obstruction, no significant bronchodilator response, no restriction, mildly reduced diffusing capacity  Assessment:   .  Obstructive sleep apnea - Continues to benefit from CPAP use Using CPAP every night  Nocturnal desaturations - Continue CPAP nightly  .  Chronic cough improved  .  History of pulmonary hypertension - This is  multifactorial - Continue CPAP - Continue oxygen use during the day  .  Coronary artery disease - Continue to follow-up with cardiology  .  Shortness of breath .  Deconditioning  Atelectasis   Plan/Recommendations:  Encouraged to continue CPAP nightly  Continue graded activities as tolerated  Continue Trelegy  Continue albuterol  as needed  Follow-up in 6 months from here  Jennet Epley MD [facility] 07/14/2024, 11:38 AM  CC: Inc, [employer]*

## 2024-07-14 NOTE — Patient Instructions (Signed)
 Your CPAP looks like it is working well - The download from the machine shows it is controlling the events well  Continue to try to stay active  Continue using your oxygen around-the-clock  Call us  with significant concerns  Follow-up in 6 months

## 2024-08-01 ENCOUNTER — Ambulatory Visit (HOSPITAL_BASED_OUTPATIENT_CLINIC_OR_DEPARTMENT_OTHER): Payer: Medicare (Managed Care) | Admitting: Cardiology

## 2024-08-01 ENCOUNTER — Encounter (HOSPITAL_BASED_OUTPATIENT_CLINIC_OR_DEPARTMENT_OTHER): Payer: Self-pay | Admitting: Cardiology

## 2024-08-01 VITALS — BP 132/72 | HR 83 | Resp 18 | Ht <= 58 in | Wt 189.0 lb

## 2024-08-01 DIAGNOSIS — I272 Pulmonary hypertension, unspecified: Secondary | ICD-10-CM | POA: Diagnosis not present

## 2024-08-01 DIAGNOSIS — J9611 Chronic respiratory failure with hypoxia: Secondary | ICD-10-CM | POA: Diagnosis not present

## 2024-08-01 DIAGNOSIS — I10 Essential (primary) hypertension: Secondary | ICD-10-CM

## 2024-08-01 DIAGNOSIS — R600 Localized edema: Secondary | ICD-10-CM

## 2024-08-01 DIAGNOSIS — I2510 Atherosclerotic heart disease of native coronary artery without angina pectoris: Secondary | ICD-10-CM

## 2024-08-01 DIAGNOSIS — Z9981 Dependence on supplemental oxygen: Secondary | ICD-10-CM

## 2024-08-01 NOTE — Progress Notes (Signed)
 Cardiology Office Note:  .   Date:  08/01/2024  ID:  Treena Cosman, DOB [birth year], MRN [id number] PCP: Cloria Annabella CROME, DO  [facility] Providers Cardiologist:  Shelda Bruckner, MD {  History of Present Illness: Jennifer [name] is a 89 y.o. female with a hx of CAD with stable angina, HTN, HLD, sleep apnea, GERD with hiatal hernia/esophageal stricture, pulmonary hypertension with chronic hypoxemic respiratory failure who is seen for follow up today. I initially met her 01/02/20 as a new patient to me/transfer from Dr. Mady.   Cardiac history: PCI to LAD in 2009 in [place] . Had repeat cath in 2014 due to accelerating angina, stent patent, jailed diagonal, no intervention. Nuc stress 2018 without ischemic, echo 2018 with normal LV function and RVSP 26 mmHg. She established with Dr. Mady in 05/2018 after moving from [place] . Nuclear stress done here in 06/2018 showed breast attenuation but no ischemia. Echo 06/2018 showed normal LV function with pHtn (PASP 55). She had her ranexa  dose decreased due to dizziness 08/2018. She had palpitations in 09/2018 and had 30 day event monitor ordered, which showed no significant arrhythmias.   Today: Overall feels she is doing ok. Sometimes feels a sharp/severe pain in her left shoulder with certain movements, better when she moves it around. No other chest pain. Breathing is stable. On O2 all day, on CPAP with O2 at night. No recent fevers/chills.  She has not had recent significant swelling in her legs. Feels that she pees all the time.  Had an episode when she was in the bathroom and felt dizzy. Didn't fall.  ROS: Denies other chest pain, change in breathing. No PND, orthopnea, LE edema or unexpected weight gain. No syncope or palpitations. ROS otherwise negative except as noted.   Studies Reviewed: SABRA    EKG:       Physical Exam:   VS:  BP 132/72 (BP Location: Left Arm, Patient Position: Sitting, Cuff Size: Large)   Pulse 83   Resp 18    Ht 4' 5 (1.346 m)   Wt 189 lb (85.7 kg)   LMP  (LMP Unknown)   SpO2 95%   BMI 47.31 kg/m    Wt Readings from Last 3 Encounters:  08/01/24 189 lb (85.7 kg)  07/14/24 189 lb (85.7 kg)  08/12/23 181 lb 12.8 oz (82.5 kg)    GEN: Well nourished, well developed in no acute distress. On O2 by nasal cannula HEENT: Normal, moist mucous membranes NECK: No JVD appreciated sitting upright CARDIAC: regular rhythm, normal S1 and S2, no rubs or gallops. 1/6 systolic murmur. VASCULAR: Radial and DP pulses 2+ bilaterally. No carotid bruits RESPIRATORY:  distant but clear to auscultation without rales, wheezing or rhonchi  ABDOMEN: Soft, non-tender, non-distended MUSCULOSKELETAL:  Ambulates independently with rollator SKIN: Warm and dry, bilateral trivial to 1+ calf edema NEUROLOGIC:  Alert and oriented x 3. No focal neuro deficits noted. PSYCHIATRIC:  Normal affect    ASSESSMENT AND PLAN: .    Pulmonary hypertension Chronic hypoxemic respiratory failure Chronic dyspnea on exertion, LE edema. -echo with hyperdynamic LVEF, only grade 1 diastolic dysfunction, and elevated right sided pressures -consistent with pulmonary hypertension as cause -on chronic home O2, follows with Dr. Neda for sleep apnea and pulmonary hypertension -continue torsemide  BID   CAD:  -no angina, does have shoulder pain but motion/position related -continue aspirin 81 mg, pravastatin 80 mg, ranolazine  500 mg BID -counseled on red flag warning signs requiring immediate medical attention -has PRN  SL NG, has not required   Hypertension: near goal <130/80, ok given age -continue enalapril -continue torsemide  as above -had issues with beta blocker in the past, avoid   Secondary prevention -recommend heart healthy/Mediterranean diet, with whole grains, fruits, vegetable, fish, lean meats, nuts, and olive oil. Limit salt. -recommend avoidance of tobacco products. Avoid excess alcohol.  Dispo: 6 mos or sooner as  needed  Copy of note will be sent to: [facility] [address]  [place], [place] [address] Fax [phone]  Signed, Shelda Bruckner, MD   Shelda Bruckner, MD, PhD, [facility] [place]  [facility]    [facility] Vascular at [facility] at [facility] [address] [place], [place] [address] [phone]

## 2024-08-01 NOTE — Patient Instructions (Signed)

## 2024-09-23 ENCOUNTER — Other Ambulatory Visit: Payer: Self-pay

## 2024-09-23 DIAGNOSIS — Z1231 Encounter for screening mammogram for malignant neoplasm of breast: Secondary | ICD-10-CM

## 2024-10-20 ENCOUNTER — Emergency Department (HOSPITAL_COMMUNITY): Payer: Medicare (Managed Care)

## 2024-10-20 ENCOUNTER — Inpatient Hospital Stay (HOSPITAL_COMMUNITY)
Admission: EM | Admit: 2024-10-20 | Discharge: 2024-10-27 | DRG: 388 | Disposition: A | Discharge status: Hospice | Payer: Medicare (Managed Care) | Attending: Emergency Medicine | Admitting: Emergency Medicine

## 2024-10-20 DIAGNOSIS — Z825 Family history of asthma and other chronic lower respiratory diseases: Secondary | ICD-10-CM

## 2024-10-20 DIAGNOSIS — Z6841 Body Mass Index (BMI) 40.0 and over, adult: Secondary | ICD-10-CM | POA: Diagnosis not present

## 2024-10-20 DIAGNOSIS — R0602 Shortness of breath: Secondary | ICD-10-CM | POA: Diagnosis not present

## 2024-10-20 DIAGNOSIS — I5082 Biventricular heart failure: Secondary | ICD-10-CM | POA: Diagnosis present

## 2024-10-20 DIAGNOSIS — E1122 Type 2 diabetes mellitus with diabetic chronic kidney disease: Secondary | ICD-10-CM | POA: Diagnosis present

## 2024-10-20 DIAGNOSIS — J9621 Acute and chronic respiratory failure with hypoxia: Secondary | ICD-10-CM | POA: Diagnosis not present

## 2024-10-20 DIAGNOSIS — N1832 Chronic kidney disease, stage 3b: Secondary | ICD-10-CM | POA: Diagnosis present

## 2024-10-20 DIAGNOSIS — G4733 Obstructive sleep apnea (adult) (pediatric): Secondary | ICD-10-CM | POA: Diagnosis not present

## 2024-10-20 DIAGNOSIS — I4891 Unspecified atrial fibrillation: Secondary | ICD-10-CM | POA: Diagnosis not present

## 2024-10-20 DIAGNOSIS — J690 Pneumonitis due to inhalation of food and vomit: Secondary | ICD-10-CM | POA: Diagnosis not present

## 2024-10-20 DIAGNOSIS — E1151 Type 2 diabetes mellitus with diabetic peripheral angiopathy without gangrene: Secondary | ICD-10-CM | POA: Diagnosis present

## 2024-10-20 DIAGNOSIS — N390 Urinary tract infection, site not specified: Secondary | ICD-10-CM | POA: Diagnosis not present

## 2024-10-20 DIAGNOSIS — M8580 Other specified disorders of bone density and structure, unspecified site: Secondary | ICD-10-CM | POA: Diagnosis present

## 2024-10-20 DIAGNOSIS — Z79899 Other long term (current) drug therapy: Secondary | ICD-10-CM

## 2024-10-20 DIAGNOSIS — Z7951 Long term (current) use of inhaled steroids: Secondary | ICD-10-CM

## 2024-10-20 DIAGNOSIS — I5030 Unspecified diastolic (congestive) heart failure: Secondary | ICD-10-CM | POA: Diagnosis not present

## 2024-10-20 DIAGNOSIS — Z9981 Dependence on supplemental oxygen: Secondary | ICD-10-CM | POA: Diagnosis not present

## 2024-10-20 DIAGNOSIS — I130 Hypertensive heart and chronic kidney disease with heart failure and stage 1 through stage 4 chronic kidney disease, or unspecified chronic kidney disease: Secondary | ICD-10-CM | POA: Diagnosis present

## 2024-10-20 DIAGNOSIS — E871 Hypo-osmolality and hyponatremia: Secondary | ICD-10-CM | POA: Diagnosis present

## 2024-10-20 DIAGNOSIS — K56609 Unspecified intestinal obstruction, unspecified as to partial versus complete obstruction: Secondary | ICD-10-CM | POA: Diagnosis present

## 2024-10-20 DIAGNOSIS — E861 Hypovolemia: Secondary | ICD-10-CM | POA: Diagnosis not present

## 2024-10-20 DIAGNOSIS — K5730 Diverticulosis of large intestine without perforation or abscess without bleeding: Secondary | ICD-10-CM | POA: Diagnosis present

## 2024-10-20 DIAGNOSIS — Z79890 Hormone replacement therapy (postmenopausal): Secondary | ICD-10-CM

## 2024-10-20 DIAGNOSIS — F32A Depression, unspecified: Secondary | ICD-10-CM | POA: Diagnosis present

## 2024-10-20 DIAGNOSIS — Z66 Do not resuscitate: Secondary | ICD-10-CM | POA: Diagnosis present

## 2024-10-20 DIAGNOSIS — I5033 Acute on chronic diastolic (congestive) heart failure: Secondary | ICD-10-CM | POA: Diagnosis not present

## 2024-10-20 DIAGNOSIS — Z711 Person with feared health complaint in whom no diagnosis is made: Secondary | ICD-10-CM | POA: Diagnosis not present

## 2024-10-20 DIAGNOSIS — I2510 Atherosclerotic heart disease of native coronary artery without angina pectoris: Secondary | ICD-10-CM | POA: Diagnosis present

## 2024-10-20 DIAGNOSIS — K429 Umbilical hernia without obstruction or gangrene: Secondary | ICD-10-CM | POA: Diagnosis present

## 2024-10-20 DIAGNOSIS — K56600 Partial intestinal obstruction, unspecified as to cause: Principal | ICD-10-CM | POA: Diagnosis present

## 2024-10-20 DIAGNOSIS — Z515 Encounter for palliative care: Secondary | ICD-10-CM

## 2024-10-20 DIAGNOSIS — N179 Acute kidney failure, unspecified: Secondary | ICD-10-CM | POA: Diagnosis not present

## 2024-10-20 DIAGNOSIS — Z7982 Long term (current) use of aspirin: Secondary | ICD-10-CM

## 2024-10-20 DIAGNOSIS — Z7189 Other specified counseling: Secondary | ICD-10-CM | POA: Diagnosis not present

## 2024-10-20 DIAGNOSIS — E669 Obesity, unspecified: Secondary | ICD-10-CM | POA: Diagnosis present

## 2024-10-20 DIAGNOSIS — K222 Esophageal obstruction: Secondary | ICD-10-CM | POA: Diagnosis present

## 2024-10-20 DIAGNOSIS — E039 Hypothyroidism, unspecified: Secondary | ICD-10-CM | POA: Diagnosis present

## 2024-10-20 DIAGNOSIS — Z9181 History of falling: Secondary | ICD-10-CM

## 2024-10-20 DIAGNOSIS — Z955 Presence of coronary angioplasty implant and graft: Secondary | ICD-10-CM

## 2024-10-20 DIAGNOSIS — I2729 Other secondary pulmonary hypertension: Secondary | ICD-10-CM | POA: Diagnosis present

## 2024-10-20 DIAGNOSIS — I493 Ventricular premature depolarization: Secondary | ICD-10-CM | POA: Diagnosis present

## 2024-10-20 DIAGNOSIS — F064 Anxiety disorder due to known physiological condition: Secondary | ICD-10-CM | POA: Diagnosis present

## 2024-10-20 DIAGNOSIS — I10 Essential (primary) hypertension: Secondary | ICD-10-CM | POA: Diagnosis not present

## 2024-10-20 DIAGNOSIS — K432 Incisional hernia without obstruction or gangrene: Secondary | ICD-10-CM | POA: Diagnosis present

## 2024-10-20 DIAGNOSIS — I509 Heart failure, unspecified: Secondary | ICD-10-CM | POA: Diagnosis not present

## 2024-10-20 DIAGNOSIS — E878 Other disorders of electrolyte and fluid balance, not elsewhere classified: Secondary | ICD-10-CM | POA: Diagnosis not present

## 2024-10-20 DIAGNOSIS — Z7901 Long term (current) use of anticoagulants: Secondary | ICD-10-CM | POA: Diagnosis not present

## 2024-10-20 DIAGNOSIS — K219 Gastro-esophageal reflux disease without esophagitis: Secondary | ICD-10-CM | POA: Diagnosis present

## 2024-10-20 DIAGNOSIS — I5031 Acute diastolic (congestive) heart failure: Secondary | ICD-10-CM | POA: Diagnosis not present

## 2024-10-20 DIAGNOSIS — Z7902 Long term (current) use of antithrombotics/antiplatelets: Secondary | ICD-10-CM

## 2024-10-20 DIAGNOSIS — E7800 Pure hypercholesterolemia, unspecified: Secondary | ICD-10-CM | POA: Diagnosis present

## 2024-10-20 DIAGNOSIS — I480 Paroxysmal atrial fibrillation: Secondary | ICD-10-CM | POA: Diagnosis not present

## 2024-10-20 DIAGNOSIS — Z8249 Family history of ischemic heart disease and other diseases of the circulatory system: Secondary | ICD-10-CM

## 2024-10-20 DIAGNOSIS — I50810 Right heart failure, unspecified: Secondary | ICD-10-CM | POA: Diagnosis not present

## 2024-10-20 DIAGNOSIS — I959 Hypotension, unspecified: Secondary | ICD-10-CM | POA: Diagnosis not present

## 2024-10-20 DIAGNOSIS — Z981 Arthrodesis status: Secondary | ICD-10-CM

## 2024-10-20 LAB — I-STAT CHEM 8, ED
BUN: 18 mg/dL (ref 8–23)
Calcium, Ion: 1.19 mmol/L (ref 1.15–1.40)
Chloride: 104 mmol/L (ref 98–111)
Creatinine, Ser: 1.2 mg/dL — ABNORMAL HIGH (ref 0.44–1.00)
Glucose, Bld: 138 mg/dL — ABNORMAL HIGH (ref 70–99)
HCT: 39 % (ref 36.0–46.0)
Hemoglobin: 13.3 g/dL (ref 12.0–15.0)
Potassium: 3.8 mmol/L (ref 3.5–5.1)
Sodium: 141 mmol/L (ref 135–145)
TCO2: 30 mmol/L (ref 22–32)

## 2024-10-20 LAB — COMPREHENSIVE METABOLIC PANEL WITH GFR
ALT: 8 U/L (ref 0–44)
AST: 24 U/L (ref 15–41)
Albumin: 4 g/dL (ref 3.5–5.0)
Alkaline Phosphatase: 126 U/L (ref 38–126)
Anion gap: 11 (ref 5–15)
BUN: 16 mg/dL (ref 8–23)
CO2: 27 mmol/L (ref 22–32)
Calcium: 9.7 mg/dL (ref 8.9–10.3)
Chloride: 102 mmol/L (ref 98–111)
Creatinine, Ser: 1.11 mg/dL — ABNORMAL HIGH (ref 0.44–1.00)
GFR, Estimated: 48 mL/min — ABNORMAL LOW (ref >60)
Glucose, Bld: 133 mg/dL — ABNORMAL HIGH (ref 70–99)
Potassium: 3.9 mmol/L (ref 3.5–5.1)
Sodium: 140 mmol/L (ref 135–145)
Total Bilirubin: 0.5 mg/dL (ref 0.0–1.2)
Total Protein: 8.4 g/dL — ABNORMAL HIGH (ref 6.5–8.1)

## 2024-10-20 LAB — CBC WITH DIFFERENTIAL/PLATELET
Abs Immature Granulocytes: 0.03 K/uL (ref 0.00–0.07)
Basophils Absolute: 0 K/uL (ref 0.0–0.1)
Basophils Relative: 0 %
Eosinophils Absolute: 0 K/uL (ref 0.0–0.5)
Eosinophils Relative: 0 %
HCT: 40.7 % (ref 36.0–46.0)
Hemoglobin: 12.9 g/dL (ref 12.0–15.0)
Immature Granulocytes: 0 %
Lymphocytes Relative: 7 %
Lymphs Abs: 0.7 K/uL (ref 0.7–4.0)
MCH: 28.7 pg (ref 26.0–34.0)
MCHC: 31.7 g/dL (ref 30.0–36.0)
MCV: 90.6 fL (ref 80.0–100.0)
Monocytes Absolute: 0.4 K/uL (ref 0.1–1.0)
Monocytes Relative: 4 %
Neutro Abs: 8.5 K/uL — ABNORMAL HIGH (ref 1.7–7.7)
Neutrophils Relative %: 89 %
Platelets: 303 K/uL (ref 150–400)
RBC: 4.49 MIL/uL (ref 3.87–5.11)
RDW: 13.5 % (ref 11.5–15.5)
WBC: 9.7 K/uL (ref 4.0–10.5)
nRBC: 0 % (ref 0.0–0.2)

## 2024-10-20 LAB — LIPASE, BLOOD: Lipase: 28 U/L (ref 11–51)

## 2024-10-20 MED ORDER — IOHEXOL 300 MG/ML  SOLN
80.0000 mL | Freq: Once | INTRAMUSCULAR | Status: AC | PRN
  Administered 2024-10-20: 80 mL via INTRAVENOUS

## 2024-10-20 MED ORDER — IPRATROPIUM-ALBUTEROL 0.5-2.5 (3) MG/3ML IN SOLN
3.0000 mL | Freq: Three times a day (TID) | RESPIRATORY_TRACT | Status: DC
  Administered 2024-10-20 – 2024-10-21 (×2): 3 mL via RESPIRATORY_TRACT
  Filled 2024-10-20 (×2): qty 3

## 2024-10-20 MED ORDER — ARFORMOTEROL TARTRATE 15 MCG/2ML IN NEBU
15.0000 ug | INHALATION_SOLUTION | Freq: Two times a day (BID) | RESPIRATORY_TRACT | Status: DC
  Administered 2024-10-20 – 2024-10-23 (×6): 15 ug via RESPIRATORY_TRACT
  Filled 2024-10-20 (×6): qty 2

## 2024-10-20 MED ORDER — MORPHINE SULFATE (PF) 2 MG/ML IV SOLN: 1.0000 mg | INTRAVENOUS | Status: DC | PRN

## 2024-10-20 MED ORDER — NITROGLYCERIN 0.4 MG SL SUBL: 0.4000 mg | SUBLINGUAL_TABLET | SUBLINGUAL | Status: DC | PRN

## 2024-10-20 MED ORDER — MORPHINE SULFATE (PF) 4 MG/ML IV SOLN
4.0000 mg | Freq: Once | INTRAVENOUS | Status: AC
  Administered 2024-10-20: 4 mg via INTRAVENOUS
  Filled 2024-10-20: qty 1

## 2024-10-20 MED ORDER — LACTATED RINGERS IV SOLN: INTRAVENOUS | Status: DC

## 2024-10-20 MED ORDER — ONDANSETRON HCL 4 MG PO TABS
4.0000 mg | ORAL_TABLET | Freq: Four times a day (QID) | ORAL | Status: DC | PRN
  Administered 2024-10-25: 4 mg via ORAL
  Filled 2024-10-20: qty 1

## 2024-10-20 MED ORDER — ACETAMINOPHEN 325 MG PO TABS
650.0000 mg | ORAL_TABLET | Freq: Four times a day (QID) | ORAL | Status: DC | PRN
  Administered 2024-10-23: 650 mg via ORAL
  Filled 2024-10-20: qty 2

## 2024-10-20 MED ORDER — ONDANSETRON HCL 4 MG/2ML IJ SOLN
4.0000 mg | Freq: Four times a day (QID) | INTRAMUSCULAR | Status: DC | PRN
  Administered 2024-10-20 – 2024-10-24 (×4): 4 mg via INTRAVENOUS
  Filled 2024-10-20 (×4): qty 2

## 2024-10-20 MED ORDER — RANOLAZINE ER 500 MG PO TB12
500.0000 mg | ORAL_TABLET | Freq: Two times a day (BID) | ORAL | Status: DC
  Administered 2024-10-20 – 2024-10-24 (×9): 500 mg via ORAL
  Filled 2024-10-20 (×10): qty 1

## 2024-10-20 MED ORDER — PANTOPRAZOLE SODIUM 40 MG IV SOLR
40.0000 mg | Freq: Two times a day (BID) | INTRAVENOUS | Status: DC
  Administered 2024-10-20 – 2024-10-26 (×12): 40 mg via INTRAVENOUS
  Filled 2024-10-20 (×12): qty 10

## 2024-10-20 MED ORDER — HYDRALAZINE HCL 20 MG/ML IJ SOLN
10.0000 mg | Freq: Four times a day (QID) | INTRAMUSCULAR | Status: DC | PRN
Start: 2024-10-20 — End: 2024-10-26
  Administered 2024-10-20: 10 mg via INTRAVENOUS
  Filled 2024-10-20: qty 1

## 2024-10-20 MED ORDER — ENOXAPARIN SODIUM 40 MG/0.4ML IJ SOSY
40.0000 mg | PREFILLED_SYRINGE | INTRAMUSCULAR | Status: DC
  Administered 2024-10-20 – 2024-10-23 (×4): 40 mg via SUBCUTANEOUS
  Filled 2024-10-20 (×4): qty 0.4

## 2024-10-20 MED ORDER — BUDESONIDE 0.25 MG/2ML IN SUSP
0.2500 mg | Freq: Two times a day (BID) | RESPIRATORY_TRACT | Status: DC
  Administered 2024-10-20 – 2024-10-23 (×6): 0.25 mg via RESPIRATORY_TRACT
  Filled 2024-10-20 (×6): qty 2

## 2024-10-20 MED ORDER — HYDROCODONE-ACETAMINOPHEN 5-325 MG PO TABS
1.0000 | ORAL_TABLET | ORAL | Status: DC | PRN
  Administered 2024-10-24: 1 via ORAL
  Filled 2024-10-20: qty 1

## 2024-10-20 MED ORDER — ALBUTEROL SULFATE (2.5 MG/3ML) 0.083% IN NEBU
2.5000 mg | INHALATION_SOLUTION | RESPIRATORY_TRACT | Status: DC | PRN
  Administered 2024-10-23 – 2024-10-24 (×2): 2.5 mg via RESPIRATORY_TRACT
  Filled 2024-10-20 (×2): qty 3

## 2024-10-20 MED ORDER — ACETAMINOPHEN 650 MG RE SUPP: 650.0000 mg | Freq: Four times a day (QID) | RECTAL | Status: DC | PRN

## 2024-10-20 NOTE — ED Provider Notes (Signed)
  Physical Exam  BP (!) 158/90 (BP Location: Left Arm)   Pulse 77   Temp (!) 97.3 F (36.3 C) (Oral)   Resp 19   LMP  (LMP Unknown)   SpO2 99%   Physical Exam  Procedures  Procedures  ED Course / MDM    Medical Decision Making Care assumed at 4 PM.  Patient is here with abdominal pain.  Signout pending CT abdomen pelvis.  Patient does have a ventral hernia   4:24 PM CT showed umbilical hernia with no obvious obstruction.  I was unable to reduce the hernia.  Patient does have a small bowel obstruction in the terminal ileum.  Discussed with surgery to see patient.  They want me to hold off on NG tube for now since patient is not vomiting  Problems Addressed: SBO (small bowel obstruction) (HCC): acute illness or injury Umbilical hernia without obstruction and without gangrene: chronic illness or injury  Amount and/or Complexity of Data Reviewed Labs: ordered. Decision-making details documented in ED Course. Radiology: ordered and independent interpretation performed. Decision-making details documented in ED Course.  Risk Prescription drug management.          Patt Alm Macho, MD 10/20/24 [phone]

## 2024-10-20 NOTE — ED Provider Notes (Addendum)
 [place] [facility]ERGENCY DEPARTMENT AT [facility] Provider Note   CSN: [id number] Arrival date & time: 10/20/24  1200     Patient presents with: Abdominal Pain   Jennifer [name] is a 89 y.o. female.    Abdominal Pain Patient presents abdominal pain.  Nausea.  Upper abdominal pain.  Abdomen more distended.  Reported history of hiatal hernia with surgery x 2.  Family states that surgery would not be done again.  Decreased oral intake.  Difficult to get history from at baseline.  Most history comes with patient's daughter who is with her.   Past Medical History:  Diagnosis Date   Ambulates with cane    straight   At risk for falls    CAD (coronary artery disease)    Cataracts, bilateral    GERD (gastroesophageal reflux disease)    Hypercholesterolemia    Hypertension    Hypothyroidism    Major depression    Musculoskeletal back pain    CHRONIC LEFT-SIDED DISCOMFORT   Obstructive sleep apnea on CPAP    uses CPAP nightly   Osteopenia    PVD (peripheral vascular disease)    Stable angina    SVD (spontaneous vaginal delivery)    x 2   Vitamin D deficiency    Wears glasses    Wears partial dentures    upper   Past Surgical History:  Procedure Laterality Date   ANKLE SURGERY Left    BACK SURGERY     CARDIAC CATHETERIZATION  06/2008   PCI to LAD with 3.0 x 12 mm DES   COLONOSCOPY     EYE SURGERY     remove cataracts - bilateral   MULTIPLE TOOTH EXTRACTIONS     upper teeth - upper dentures   OPEN REDUCTION INTERNAL FIXATION (ORIF) DISTAL RADIAL FRACTURE Left 05/16/2019   Procedure: OPEN REDUCTION INTERNAL FIXATION (ORIF) DISTAL RADIAL FRACTURE;  Surgeon: Sissy Cough, MD;  Location: [facility] OR;  Service: Orthopedics;  Laterality: Left;   SPINAL FUSION     UPPER GI ENDOSCOPY       Prior to Admission medications   Medication Sig Start Date End Date Taking? Authorizing Provider  acetaminophen  (TYLENOL ) 650 MG CR tablet Take 650 mg by mouth 2 (two) times a day.     [provider]  albuterol  (VENTOLIN  HFA) 108 (90 Base) MCG/ACT inhaler Inhale 2 puffs into the lungs every 6 (six) hours as needed for wheezing or shortness of breath. 11/29/19   Neda Jennet LABOR, MD  aspirin EC 81 MG tablet Take 81 mg by mouth daily. CARDIAC EVENT PREVENTION    [provider]  benzonatate  (TESSALON ) 200 MG capsule Take 1 capsule (200 mg total) by mouth 3 (three) times daily as needed for cough. 09/22/22   Olalere, Jennet LABOR, MD  Cholecalciferol (VITAMIN D3) 5000 units TABS Take 5,000 Units by mouth daily with breakfast.     [provider]  enalapril (VASOTEC) 20 MG tablet Take 20 mg by mouth daily.    [provider]  escitalopram (LEXAPRO) 5 MG tablet Take 5 mg by mouth daily.    [provider]  Fluticasone -Umeclidin-Vilant (TRELEGY ELLIPTA ) 100-62.5-25 MCG/ACT AEPB Inhale 1 puff into the lungs daily. 03/19/23   Neda Jennet A, MD  ibuprofen (ADVIL) 200 MG tablet Take 600 mg by mouth daily as needed.    [provider]  levothyroxine (SYNTHROID, LEVOTHROID) 50 MCG tablet Take 50 mcg by mouth daily before breakfast.    [provider]  mirtazapine (REMERON) 7.5 MG tablet Take 7.5 mg by mouth at bedtime.    [provider]  Multiple Vitamins-Minerals (PRESERVISION AREDS 2) CAPS Take 1 tablet by mouth daily.    [provider]  nitroGLYCERIN (NITROSTAT) 0.4 MG SL tablet Place 0.4 mg under the tongue every 5 (five) minutes as needed for chest pain.    [provider]  pantoprazole (PROTONIX) 20 MG tablet Take 20 mg by mouth daily.    [provider]  pravastatin (PRAVACHOL) 80 MG tablet Take 80 mg by mouth every evening.    [provider]  ranolazine  (RANEXA ) 500 MG 12 hr tablet Take 500 mg by mouth 2 (two) times daily.    [provider]  Spacer/Aero-Holding Chambers (AEROCHAMBER MV) inhaler Use as instructed 11/29/19   Neda Hammond A, MD  torsemide   (DEMADEX ) 20 MG tablet Take 1 tablet (20 mg total) by mouth 2 (two) times daily. 09/10/20 08/01/24  Lonni Slain, MD  traMADol (ULTRAM) 50 MG tablet Take 50 mg by mouth daily as needed for moderate pain.    [provider]    Allergies: Patient has no known allergies.    Review of Systems  Gastrointestinal:  Positive for abdominal pain.    Updated Vital Signs BP (!) 162/82 (BP Location: Left Arm)   Pulse 82   Temp 98.1 F (36.7 C) (Oral)   Resp 16   LMP  (LMP Unknown)   Physical Exam Vitals reviewed.  Abdominal:     Tenderness: There is abdominal tenderness.     Comments:  upper abdominal tenderness.  May have some distention.  Fullness also in the midline.  Possible ventral hernia.  Somewhat limited by body habitus but does appear as if it will reduce but recur.  Neurological:     Mental Status: She is alert.     (all labs ordered are listed, but only abnormal results are displayed) Labs Reviewed  COMPREHENSIVE METABOLIC PANEL WITH GFR - Abnormal; Notable for the following components:      Result Value   Glucose, Bld 133 (*)    Creatinine, Ser 1.11 (*)    Total Protein 8.4 (*)    GFR, Estimated 48 (*)    All other components within normal limits  CBC WITH DIFFERENTIAL/PLATELET - Abnormal; Notable for the following components:   Neutro Abs 8.5 (*)    All other components within normal limits  I-STAT CHEM 8, ED - Abnormal; Notable for the following components:   Creatinine, Ser 1.20 (*)    Glucose, Bld 138 (*)    All other components within normal limits  LIPASE, BLOOD    EKG: EKG Interpretation Date/Time:  Thursday October 20 2024 12:34:07 EST Ventricular Rate:  80 PR Interval:  157 QRS Duration:  104 QT Interval:  383 QTC Calculation: 442 R Axis:   19  Text Interpretation: Sinus rhythm Multiple premature complexes, vent & supraven Abnormal R-wave progression, early transition No significant change since last tracing Confirmed by Patt Alm DEL  [phone]) on 10/20/2024 3:04:04 PM  Radiology: No results found.   Procedures   Medications Ordered in the ED  iohexol (OMNIPAQUE) 300 MG/ML solution 80 mL (has no administration in time range)                                    Medical Decision Making Amount and/or Complexity of Data Reviewed Labs: ordered. Radiology: ordered.  Risk  Prescription drug management.   Patient with abdominal pain.  Some nausea.  Some abdominal distention.  Differential diagnosis does include obstruction.  Could be from ventral or hiatal hernia.  Will get basic blood work.  Will get CT scan to evaluate.  Care turned over to oncoming provider.     Final diagnoses:  None    ED Discharge Orders     None          Patsey Lot, MD 10/20/24 1414    Patsey Lot, MD 10/20/24 [phone]

## 2024-10-20 NOTE — ED Notes (Signed)
Dr. Yao at bedside. 

## 2024-10-20 NOTE — ED Triage Notes (Signed)
 Pt arrives via [facility] from home. Pt reports that shortly after midnight she was awoken from sleep with epigastric pain and nausea. Pt denies CP, SOB, or radiation. States that she has known abd hernia, but unable to specify where. Family states that pt's abd appears more distended than usual. Pt given 4 mg zofran  IV enroute with EMS.

## 2024-10-20 NOTE — Consult Note (Signed)
 CC/Reason for consult: Small bowel obstruction, possible  Requesting MD: [place], MD/David Patt MD  HPI: Jennifer [name] is an 89 y.o. female with hx ofHTN, HLD, hypothyroidism, depressoin, PAD, CAD, who presented to the hospital for evaluation of nausea as well as some upper abdominal discomfort.  No apparent emesis.  She was noted to have some distention on exam and underwent CT. We were asked to see for possibility of small  Currently reports no emesis just some nausea; no fever/chills. No sick contacts. No blood in stool or dark/black tarry stool. Last BM was this morning.  Decreased oral intake. Currently reports no abdominal pain. Feels better this afternoon.  PSH: 3 prior ventral hernia repairs at [facility] in [place], with mesh.  Past Medical History:  Diagnosis Date   Ambulates with cane    straight   At risk for falls    CAD (coronary artery disease)    Cataracts, bilateral    GERD (gastroesophageal reflux disease)    Hypercholesterolemia    Hypertension    Hypothyroidism    Major depression    Musculoskeletal back pain    CHRONIC LEFT-SIDED DISCOMFORT   Obstructive sleep apnea on CPAP    uses CPAP nightly   Osteopenia    PVD (peripheral vascular disease)    Stable angina    SVD (spontaneous vaginal delivery)    x 2   Vitamin D deficiency    Wears glasses    Wears partial dentures    upper    Past Surgical History:  Procedure Laterality Date   ANKLE SURGERY Left    BACK SURGERY     CARDIAC CATHETERIZATION  06/2008   PCI to LAD with 3.0 x 12 mm DES   COLONOSCOPY     EYE SURGERY     remove cataracts - bilateral   MULTIPLE TOOTH EXTRACTIONS     upper teeth - upper dentures   OPEN REDUCTION INTERNAL FIXATION (ORIF) DISTAL RADIAL FRACTURE Left 05/16/2019   Procedure: OPEN REDUCTION INTERNAL FIXATION (ORIF) DISTAL RADIAL FRACTURE;  Surgeon: Sissy Cough, MD;  Location: [facility] OR;  Service: Orthopedics;  Laterality: Left;   SPINAL FUSION     UPPER GI  ENDOSCOPY      Family History  Problem Relation Age of Onset   Heart disease Mother    Asthma Father    Heart disease Brother 3       CAD    Social:  reports that she has never smoked. She has never used smokeless tobacco. She reports that she does not drink alcohol and does not use drugs.  Allergies: No Known Allergies  Medications: I have reviewed the patient's current medications.  Results for orders placed or performed during the hospital encounter of 10/20/24 (from the past 48 hours)  Comprehensive metabolic panel     Status: Abnormal   Collection Time: 10/20/24  1:53 PM  Result Value Ref Range   Sodium 140 135 - 145 mmol/L   Potassium 3.9 3.5 - 5.1 mmol/L   Chloride 102 98 - 111 mmol/L   CO2 27 22 - 32 mmol/L   Glucose, Bld 133 (H) 70 - 99 mg/dL    Comment: Glucose reference range applies only to samples taken after fasting for at least 8 hours.   BUN 16 8 - 23 mg/dL   Creatinine, Ser 8.88 (H) 0.44 - 1.00 mg/dL   Calcium 9.7 8.9 - 89.6 mg/dL   Total Protein 8.4 (H) 6.5 - 8.1 g/dL   Albumin 4.0  3.5 - 5.0 g/dL   AST 24 15 - 41 U/L   ALT 8 0 - 44 U/L   Alkaline Phosphatase 126 38 - 126 U/L   Total Bilirubin 0.5 0.0 - 1.2 mg/dL   GFR, Estimated 48 (L) >60 mL/min    Comment: (NOTE) Calculated using the CKD-EPI Creatinine Equation (2021)    Anion gap 11 5 - 15    Comment: Performed at [facility], [address], [place], [place] [address]  Lipase, blood     Status: None   Collection Time: 10/20/24  1:53 PM  Result Value Ref Range   Lipase 28 11 - 51 U/L    Comment: Performed at [facility], [address] [place], [place] [address]  CBC with Differential     Status: Abnormal   Collection Time: 10/20/24  1:53 PM  Result Value Ref Range   WBC 9.7 4.0 - 10.5 K/uL   RBC 4.49 3.87 - 5.11 MIL/uL   Hemoglobin 12.9 12.0 - 15.0 g/dL   HCT 59.2 63.9 - 53.9 %   MCV 90.6 80.0 - 100.0 fL   MCH 28.7 26.0 - 34.0 pg   MCHC 31.7 30.0 -  36.0 g/dL   RDW 86.4 88.4 - 84.4 %   Platelets 303 150 - 400 K/uL   nRBC 0.0 0.0 - 0.2 %   Neutrophils Relative % 89 %   Neutro Abs 8.5 (H) 1.7 - 7.7 K/uL   Lymphocytes Relative 7 %   Lymphs Abs 0.7 0.7 - 4.0 K/uL   Monocytes Relative 4 %   Monocytes Absolute 0.4 0.1 - 1.0 K/uL   Eosinophils Relative 0 %   Eosinophils Absolute 0.0 0.0 - 0.5 K/uL   Basophils Relative 0 %   Basophils Absolute 0.0 0.0 - 0.1 K/uL   Immature Granulocytes 0 %   Abs Immature Granulocytes 0.03 0.00 - 0.07 K/uL    Comment: Performed at [facility], [address], [place], [place] [address]  Dozier brew 8, ED     Status: Abnormal   Collection Time: 10/20/24  2:04 PM  Result Value Ref Range   Sodium 141 135 - 145 mmol/L   Potassium 3.8 3.5 - 5.1 mmol/L   Chloride 104 98 - 111 mmol/L   BUN 18 8 - 23 mg/dL   Creatinine, Ser 8.79 (H) 0.44 - 1.00 mg/dL   Glucose, Bld 861 (H) 70 - 99 mg/dL    Comment: Glucose reference range applies only to samples taken after fasting for at least 8 hours.   Calcium, Ion 1.19 1.15 - 1.40 mmol/L   TCO2 30 22 - 32 mmol/L   Hemoglobin 13.3 12.0 - 15.0 g/dL   HCT 60.9 63.9 - 53.9 %    CT ABDOMEN PELVIS W CONTRAST Result Date: 10/20/2024 EXAM: CT ABDOMEN AND PELVIS WITH CONTRAST 10/20/2024 03:17:45 PM TECHNIQUE: CT of the abdomen and pelvis was performed with the administration of 80 mL of iohexol (OMNIPAQUE) 300 MG/ML solution. Multiplanar reformatted images are provided for review. Automated exposure control, iterative reconstruction, and/or weight-based adjustment of the mA/kV was utilized to reduce the radiation dose to as low as reasonably achievable. COMPARISON: 12/12/2019 CLINICAL HISTORY: Bowel obstruction suspected. FINDINGS: LOWER CHEST: Moderate-sized hiatal hernia. LIVER: The liver is unremarkable. GALLBLADDER AND BILE DUCTS: Gallbladder is unremarkable. No biliary ductal dilatation. SPLEEN: No acute abnormality. PANCREAS: No acute abnormality. ADRENAL  GLANDS: No acute abnormality. KIDNEYS, URETERS AND BLADDER: Right renal cyst is noted. No stones  in the kidneys or ureters. No hydronephrosis. No perinephric or periureteral stranding. Urinary bladder is unremarkable. GI AND BOWEL: Stomach demonstrates no acute abnormality. Large periumbilical hernia is noted which contains a portion of transverse colon but does not result in obstruction. Sigmoid diverticulosis without inflammation. Moderate diffuse small bowel dilatation is noted with some fecalization present concerning for distal obstruction. This appears to extend to the terminal ileum which appears to be severely thickened and extends into the ileocecal valve. This may represent inflammation although mass cannot be excluded. PERITONEUM AND RETROPERITONEUM: No ascites. No free air. VASCULATURE: Aorta is normal in caliber. Aortic atherosclerosis. LYMPH NODES: No lymphadenopathy. REPRODUCTIVE ORGANS: No acute abnormality. BONES AND SOFT TISSUES: No acute osseous abnormality. No focal soft tissue abnormality. IMPRESSION: 1. Moderate diffuse small bowel dilatation with fecalization, concerning for distal obstruction, likely at the terminal ileum; severe terminal ileal wall thickening with extension into the ileocecal valve, with differential including inflammatory etiology versus mass. 2. Large periumbilical hernia containing a portion of the transverse colon without obstruction. 3. Sigmoid diverticulosis without inflammation. Electronically signed by: Lynwood Seip MD 10/20/2024 03:46 PM EST RP Workstation: [id number]    ROS - all of the below systems have been reviewed with the patient and positives are indicated with bold text General: chills, fever or night sweats Eyes: blurry vision or double vision ENT: epistaxis or sore throat Allergy/Immunology: itchy/watery eyes or nasal congestion Hematologic/Lymphatic: bleeding problems, blood clots or swollen lymph nodes Endocrine: temperature intolerance or  unexpected weight changes Breast: new or changing breast lumps or nipple discharge Resp: cough, shortness of breath, or wheezing CV: chest pain or dyspnea on exertion GI: as per HPI GU: dysuria, trouble voiding, or hematuria MSK: joint pain or joint stiffness Neuro: TIA or stroke symptoms Derm: pruritus and skin lesion changes Psych: anxiety and depression  PE Blood pressure (!) 158/90, pulse 77, temperature (!) 97.3 F (36.3 C), temperature source Oral, resp. rate 19, SpO2 99%. Constitutional: NAD; conversant Eyes: Moist conjunctiva Lungs: Normal respiratory effort CV: RRR GI: Abd obese, soft, nontender; incisional hernia reducible. Nondistended; no palpable hepatosplenomegaly Psychiatric: Appropriate affect  Results for orders placed or performed during the hospital encounter of 10/20/24 (from the past 48 hours)  Comprehensive metabolic panel     Status: Abnormal   Collection Time: 10/20/24  1:53 PM  Result Value Ref Range   Sodium 140 135 - 145 mmol/L   Potassium 3.9 3.5 - 5.1 mmol/L   Chloride 102 98 - 111 mmol/L   CO2 27 22 - 32 mmol/L   Glucose, Bld 133 (H) 70 - 99 mg/dL    Comment: Glucose reference range applies only to samples taken after fasting for at least 8 hours.   BUN 16 8 - 23 mg/dL   Creatinine, Ser 8.88 (H) 0.44 - 1.00 mg/dL   Calcium 9.7 8.9 - 89.6 mg/dL   Total Protein 8.4 (H) 6.5 - 8.1 g/dL   Albumin 4.0 3.5 - 5.0 g/dL   AST 24 15 - 41 U/L   ALT 8 0 - 44 U/L   Alkaline Phosphatase 126 38 - 126 U/L   Total Bilirubin 0.5 0.0 - 1.2 mg/dL   GFR, Estimated 48 (L) >60 mL/min    Comment: (NOTE) Calculated using the CKD-EPI Creatinine Equation (2021)    Anion gap 11 5 - 15    Comment: Performed at [facility], [address] [place], [place] [address]  Lipase, blood     Status: None   Collection Time:  10/20/24  1:53 PM  Result Value Ref Range   Lipase 28 11 - 51 U/L    Comment: Performed at [facility], [address], [place], [place] [address]  CBC with Differential     Status: Abnormal   Collection Time: 10/20/24  1:53 PM  Result Value Ref Range   WBC 9.7 4.0 - 10.5 K/uL   RBC 4.49 3.87 - 5.11 MIL/uL   Hemoglobin 12.9 12.0 - 15.0 g/dL   HCT 59.2 63.9 - 53.9 %   MCV 90.6 80.0 - 100.0 fL   MCH 28.7 26.0 - 34.0 pg   MCHC 31.7 30.0 - 36.0 g/dL   RDW 86.4 88.4 - 84.4 %   Platelets 303 150 - 400 K/uL   nRBC 0.0 0.0 - 0.2 %   Neutrophils Relative % 89 %   Neutro Abs 8.5 (H) 1.7 - 7.7 K/uL   Lymphocytes Relative 7 %   Lymphs Abs 0.7 0.7 - 4.0 K/uL   Monocytes Relative 4 %   Monocytes Absolute 0.4 0.1 - 1.0 K/uL   Eosinophils Relative 0 %   Eosinophils Absolute 0.0 0.0 - 0.5 K/uL   Basophils Relative 0 %   Basophils Absolute 0.0 0.0 - 0.1 K/uL   Immature Granulocytes 0 %   Abs Immature Granulocytes 0.03 0.00 - 0.07 K/uL    Comment: Performed at [facility], [address], [place], [place] [address]  Dozier brew 8, ED     Status: Abnormal   Collection Time: 10/20/24  2:04 PM  Result Value Ref Range   Sodium 141 135 - 145 mmol/L   Potassium 3.8 3.5 - 5.1 mmol/L   Chloride 104 98 - 111 mmol/L   BUN 18 8 - 23 mg/dL   Creatinine, Ser 8.79 (H) 0.44 - 1.00 mg/dL   Glucose, Bld 861 (H) 70 - 99 mg/dL    Comment: Glucose reference range applies only to samples taken after fasting for at least 8 hours.   Calcium, Ion 1.19 1.15 - 1.40 mmol/L   TCO2 30 22 - 32 mmol/L   Hemoglobin 13.3 12.0 - 15.0 g/dL   HCT 60.9 63.9 - 53.9 %    CT ABDOMEN PELVIS W CONTRAST Result Date: 10/20/2024 EXAM: CT ABDOMEN AND PELVIS WITH CONTRAST 10/20/2024 03:17:45 PM TECHNIQUE: CT of the abdomen and pelvis was performed with the administration of 80 mL of iohexol (OMNIPAQUE) 300 MG/ML solution. Multiplanar reformatted images are provided for review. Automated exposure control, iterative reconstruction, and/or weight-based adjustment of the mA/kV was utilized to reduce the radiation dose to as low  as reasonably achievable. COMPARISON: 12/12/2019 CLINICAL HISTORY: Bowel obstruction suspected. FINDINGS: LOWER CHEST: Moderate-sized hiatal hernia. LIVER: The liver is unremarkable. GALLBLADDER AND BILE DUCTS: Gallbladder is unremarkable. No biliary ductal dilatation. SPLEEN: No acute abnormality. PANCREAS: No acute abnormality. ADRENAL GLANDS: No acute abnormality. KIDNEYS, URETERS AND BLADDER: Right renal cyst is noted. No stones in the kidneys or ureters. No hydronephrosis. No perinephric or periureteral stranding. Urinary bladder is unremarkable. GI AND BOWEL: Stomach demonstrates no acute abnormality. Large periumbilical hernia is noted which contains a portion of transverse colon but does not result in obstruction. Sigmoid diverticulosis without inflammation. Moderate diffuse small bowel dilatation is noted with some fecalization present concerning for distal obstruction. This appears to extend to the terminal ileum which appears to be severely thickened and extends into the ileocecal valve. This may represent inflammation although mass cannot be excluded. PERITONEUM AND RETROPERITONEUM: No ascites. No free  air. VASCULATURE: Aorta is normal in caliber. Aortic atherosclerosis. LYMPH NODES: No lymphadenopathy. REPRODUCTIVE ORGANS: No acute abnormality. BONES AND SOFT TISSUES: No acute osseous abnormality. No focal soft tissue abnormality. IMPRESSION: 1. Moderate diffuse small bowel dilatation with fecalization, concerning for distal obstruction, likely at the terminal ileum; severe terminal ileal wall thickening with extension into the ileocecal valve, with differential including inflammatory etiology versus mass. 2. Large periumbilical hernia containing a portion of the transverse colon without obstruction. 3. Sigmoid diverticulosis without inflammation. Electronically signed by: Lynwood Seip MD 10/20/2024 03:46 PM EST RP Workstation: [id number]     A/P: Jennifer [name] is an 89 y.o. female with HTN, HLD,  hypothyroidism, depressoin, PAD, CAD here with possible partial small bowel obstruction versus ileus  - Does have some mild to moderate thickening of the terminal ileum on CT scan with mucosal enhancement.  Could have some sort of terminal ileitis and/or even gastroenteritis.  Currently without any evident emesis, stomach is mild to moderately dilated. She would like to hold off on NG tube if at all possible. If she develops emesis, would plan to place however.  - Given that there is some edema of the terminal ileum that appears to be where there is a relative transition point, suspect with time this should improve. Do not see any twisting of the bowel or internal hernias in that region to suggest anything more than ileitis.  - Would keep NPO for now, maintenance IV fluid per primary - We will follow with you.  I spent a total of 80 minutes in both face-to-face and non-face-to-face activities, excluding procedures performed, for this visit on the date of this encounter.   Lonni Pizza, MD [facility], A [facility]

## 2024-10-20 NOTE — H&P (Signed)
 History and Physical    Patient: Jennifer [name] FMW:[id number] DOB: 19-Sep-1934 DOA: 10/20/2024 DOS: the patient was seen and examined on 10/20/2024 PCP: Cloria Annabella CROME, DO  Patient coming from: Home  Chief Complaint:  Chief Complaint  Patient presents with   Abdominal Pain   HPI: Jennifer [name] is a 89 y.o. female with medical history significant of peripheral vascular disease, obstructive sleep apnea on CPAP, depression, GERD, CAD with stable angina, history of PCI to LAD 2000 and 9 repeat cath 2014 due to accelerated angina and stent patent.  Hyperlipidemia hypertension, pulmonary hypertension with chronic hypoxic respiratory failure, esophageal stricture, who presents complaining of abdominal pain, nausea, reports abdominal distention.  Patient present complaining of abdominal pain sharp in quality from the left side of her abdomen that is started midnight.  Also developed abdominal distention.  She has been having nausea, no vomiting.  She did had a bowel movement the day of admission she reported large bowel movement.  She is passing some gas.  She is currently chest pain-free, no fever no recent illness.  The pain medication that she received in the ED has alleviated the pain.  She denies shortness of breath, worsening cough.  I was able to speak with patient in her native language.   Evaluation in the ED: Sodium 140, potassium 3.9, BUN 16, creatinine 1.1, alkaline phosphatase 126, lipase 28, normal liver function test, hemoglobin 12.9, white blood cell 9.7, platelets 303, EKG: Sinus rhythm, PVC. CT abdomen and pelvis: Moderate diffuse small bowel dilatation with fecalization, concerning for distal obstruction, likely at the terminal ileum; severe terminal ileal wall thickening with extension into the ileocecal valve, with differential including inflammatory etiology versus mass. Large periumbilical hernia containing a portion of the transverse colon without obstruction. Sigmoid  diverticulosis without inflammation.    Review of Systems: As mentioned in the history of present illness. All other systems reviewed and are negative. Past Medical History:  Diagnosis Date   Ambulates with cane    straight   At risk for falls    CAD (coronary artery disease)    Cataracts, bilateral    GERD (gastroesophageal reflux disease)    Hypercholesterolemia    Hypertension    Hypothyroidism    Major depression    Musculoskeletal back pain    CHRONIC LEFT-SIDED DISCOMFORT   Obstructive sleep apnea on CPAP    uses CPAP nightly   Osteopenia    PVD (peripheral vascular disease)    Stable angina    SVD (spontaneous vaginal delivery)    x 2   Vitamin D deficiency    Wears glasses    Wears partial dentures    upper   Past Surgical History:  Procedure Laterality Date   ANKLE SURGERY Left    BACK SURGERY     CARDIAC CATHETERIZATION  06/2008   PCI to LAD with 3.0 x 12 mm DES   COLONOSCOPY     EYE SURGERY     remove cataracts - bilateral   MULTIPLE TOOTH EXTRACTIONS     upper teeth - upper dentures   OPEN REDUCTION INTERNAL FIXATION (ORIF) DISTAL RADIAL FRACTURE Left 05/16/2019   Procedure: OPEN REDUCTION INTERNAL FIXATION (ORIF) DISTAL RADIAL FRACTURE;  Surgeon: Sissy Cough, MD;  Location: [facility] OR;  Service: Orthopedics;  Laterality: Left;   SPINAL FUSION     UPPER GI ENDOSCOPY     Social History:  reports that she has never smoked. She has never used smokeless tobacco. She reports that she does not  drink alcohol and does not use drugs.  No Known Allergies  Family History  Problem Relation Age of Onset   Heart disease Mother    Asthma Father    Heart disease Brother 69       CAD    Prior to Admission medications   Medication Sig Start Date End Date Taking? Authorizing Provider  acetaminophen  (TYLENOL ) 650 MG CR tablet Take 650 mg by mouth 2 (two) times a day.    [provider]  albuterol  (VENTOLIN  HFA) 108 (90 Base) MCG/ACT inhaler Inhale 2 puffs  into the lungs every 6 (six) hours as needed for wheezing or shortness of breath. 11/29/19   Neda Jennet LABOR, MD  aspirin EC 81 MG tablet Take 81 mg by mouth daily. CARDIAC EVENT PREVENTION    [provider]  benzonatate  (TESSALON ) 200 MG capsule Take 1 capsule (200 mg total) by mouth 3 (three) times daily as needed for cough. 09/22/22   Olalere, Jennet LABOR, MD  Cholecalciferol (VITAMIN D3) 5000 units TABS Take 5,000 Units by mouth daily with breakfast.     [provider]  enalapril (VASOTEC) 20 MG tablet Take 20 mg by mouth daily.    [provider]  escitalopram (LEXAPRO) 5 MG tablet Take 5 mg by mouth daily.    [provider]  Fluticasone -Umeclidin-Vilant (TRELEGY ELLIPTA ) 100-62.5-25 MCG/ACT AEPB Inhale 1 puff into the lungs daily. 03/19/23   Neda Jennet A, MD  ibuprofen (ADVIL) 200 MG tablet Take 600 mg by mouth daily as needed.    [provider]  levothyroxine (SYNTHROID, LEVOTHROID) 50 MCG tablet Take 50 mcg by mouth daily before breakfast.    [provider]  mirtazapine (REMERON) 7.5 MG tablet Take 7.5 mg by mouth at bedtime.    [provider]  Multiple Vitamins-Minerals (PRESERVISION AREDS 2) CAPS Take 1 tablet by mouth daily.    [provider]  nitroGLYCERIN (NITROSTAT) 0.4 MG SL tablet Place 0.4 mg under the tongue every 5 (five) minutes as needed for chest pain.    [provider]  pantoprazole (PROTONIX) 20 MG tablet Take 20 mg by mouth daily.    [provider]  pravastatin (PRAVACHOL) 80 MG tablet Take 80 mg by mouth every evening.    [provider]  ranolazine  (RANEXA ) 500 MG 12 hr tablet Take 500 mg by mouth 2 (two) times daily.    [provider]  Spacer/Aero-Holding Chambers (AEROCHAMBER MV) inhaler Use as instructed 11/29/19   Neda Jennet LABOR, MD  torsemide  (DEMADEX ) 20 MG tablet Take 1 tablet (20 mg total) by mouth 2 (two) times daily. 09/10/20 08/01/24   Lonni Slain, MD  traMADol (ULTRAM) 50 MG tablet Take 50 mg by mouth daily as needed for moderate pain.    [provider]    Physical   Vitals:   10/20/24 1217 10/20/24 1415 10/20/24 1445 10/20/24 1618  BP: (!) 162/82 (!) 188/86 103/72 (!) 158/90  Pulse: 82 73 79 77  Resp: 16 20 19 19   Temp: 98.1 F (36.7 C)   (!) 97.3 F (36.3 C)  TempSrc: Oral   Oral  SpO2:  100% 100% 99%   General: Alert, no acute distress obese Cardiovascular: S1-S2 regular rhythm and rate systolic murmur Lungs: Normal respiratory effort, on 2 L of oxygen, decreased breath sounds Abdomen: Bowel sounds decreased, abdomen obese and distended, umbilical hernia, soft, mild tender, no guarding Extremity: No edema Neuro exam: She is alert and conversant, follows commands move all  4 extremities.  Data Reviewed:  Labs and imagine reviewed.   Assessment and Plan: No notes have been filed under this hospital service. Service: Hospitalist  1-SBO:  - Patient presented with abdominal pain, left side, associated with nausea. - CT abdomen and pelvis showed:  Moderate diffuse small bowel dilatation with fecalization, concerning for distal obstruction, likely at the terminal ileum; severe terminal ileal wall thickening with extension into the ileocecal valve, with differential including inflammatory etiology versus mass. - NPO. -Start IV fluids. -IV Protonix. -General Surgery consulted, plan to insert NG tube if patient starts vomiting.  Chronic hypoxic respiratory failure Pulmonary hypertension - Plan to hold Trelegy at this point, I do think that she is going to be able to provided good  effort for inhaler. - Start Pulmicort, Brovana, DuoNebs.  History of CAD s/p stent LAD 2009, history of angina: -Will resume Ranexa , will see if she is able to tolerate it.  -As needed nitroglycerin.   Hypertension: - Hold lisinopril in the setting of NPO. - I have ordered as needed  hydralazine  Hypothyroidism: - Will need to resume Synthroid when able to tolerate orals  History of CAD status post stent LAD  Depression: Resume Lexapro when able to tolerate oral Hold Remeron  GERD; Start PPI    Advance Care Planning:   Code Status: Not on file DNR, discussed with patient and daughter  Consults: General Sx  Family Communication: Care discussed with daughter who was at bedside  Severity of Illness: The appropriate patient status for this patient is INPATIENT. Inpatient status is judged to be reasonable and necessary in order to provide the required intensity of service to ensure the patient's safety. The patient's presenting symptoms, physical exam findings, and initial radiographic and laboratory data in the context of their chronic comorbidities is felt to place them at high risk for further clinical deterioration. Furthermore, it is not anticipated that the patient will be medically stable for discharge from the hospital within 2 midnights of admission.   * I certify that at the point of admission it is my clinical judgment that the patient will require inpatient hospital care spanning beyond 2 midnights from the point of admission due to high intensity of service, high risk for further deterioration and high frequency of surveillance required.*  Author: Owen DELENA Lore, MD 10/20/2024 4:33 PM  For on call review [email or web address].

## 2024-10-21 ENCOUNTER — Inpatient Hospital Stay (HOSPITAL_COMMUNITY): Payer: Medicare (Managed Care)

## 2024-10-21 DIAGNOSIS — K56609 Unspecified intestinal obstruction, unspecified as to partial versus complete obstruction: Secondary | ICD-10-CM | POA: Diagnosis not present

## 2024-10-21 LAB — CBC
HCT: 37.3 % (ref 36.0–46.0)
Hemoglobin: 11.6 g/dL — ABNORMAL LOW (ref 12.0–15.0)
MCH: 28.6 pg (ref 26.0–34.0)
MCHC: 31.1 g/dL (ref 30.0–36.0)
MCV: 91.9 fL (ref 80.0–100.0)
Platelets: 306 K/uL (ref 150–400)
RBC: 4.06 MIL/uL (ref 3.87–5.11)
RDW: 14 % (ref 11.5–15.5)
WBC: 9.9 K/uL (ref 4.0–10.5)
nRBC: 0 % (ref 0.0–0.2)

## 2024-10-21 LAB — COMPREHENSIVE METABOLIC PANEL WITH GFR
ALT: <5 U/L (ref 0–44)
AST: 25 U/L (ref 15–41)
Albumin: 3.4 g/dL — ABNORMAL LOW (ref 3.5–5.0)
Alkaline Phosphatase: 99 U/L (ref 38–126)
Anion gap: 8 (ref 5–15)
BUN: 19 mg/dL (ref 8–23)
CO2: 29 mmol/L (ref 22–32)
Calcium: 9.2 mg/dL (ref 8.9–10.3)
Chloride: 103 mmol/L (ref 98–111)
Creatinine, Ser: 1.29 mg/dL — ABNORMAL HIGH (ref 0.44–1.00)
GFR, Estimated: 40 mL/min — ABNORMAL LOW (ref >60)
Glucose, Bld: 116 mg/dL — ABNORMAL HIGH (ref 70–99)
Potassium: 4.3 mmol/L (ref 3.5–5.1)
Sodium: 140 mmol/L (ref 135–145)
Total Bilirubin: 0.6 mg/dL (ref 0.0–1.2)
Total Protein: 7.1 g/dL (ref 6.5–8.1)

## 2024-10-21 MED ORDER — IPRATROPIUM-ALBUTEROL 0.5-2.5 (3) MG/3ML IN SOLN
3.0000 mL | Freq: Two times a day (BID) | RESPIRATORY_TRACT | Status: DC
  Administered 2024-10-21 – 2024-10-22 (×2): 3 mL via RESPIRATORY_TRACT
  Filled 2024-10-21 (×2): qty 3

## 2024-10-21 MED ORDER — BIOTENE DRY MOUTH MT LIQD
15.0000 mL | OROMUCOSAL | Status: DC | PRN
  Administered 2024-10-21: 15 mL via OROMUCOSAL
  Filled 2024-10-21: qty 237

## 2024-10-21 NOTE — Progress Notes (Signed)
 Progress Note     Subjective: Patient reports that abdominal pain is better. Having flatulence. Denies BM since hospitalization. Denies nausea and vomiting.   ROS  All negative with the exception of above.  Objective: Vital signs in last 24 hours: Temp:  [97.3 F (36.3 C)-98.7 F (37.1 C)] 98.7 F (37.1 C) (11/14 0830) Pulse Rate:  [73-86] 81 (11/14 0830) Resp:  [16-21] 16 (11/14 0830) BP: (103-188)/(69-97) 137/72 (11/14 0830) SpO2:  [96 %-100 %] 100 % (11/14 0830) Last BM Date : 10/20/24  Intake/Output from previous day: No intake/output data recorded. Intake/Output this shift: No intake/output data recorded.  PE: General: Pleasant female who is laying in bed in NAD. HEENT: Head is normocephalic, atraumatic.  Heart: HR normal during encounter.  Lungs: Respiratory effort nonlabored on 2L [place] Abd: Soft with mild distention; Tenderness to deep palpation of epigastric region. Inicisional hernia is reducible. No rebound tenderness or guarding.  Skin: Warm and dry. Psych: A&Ox3 with an appropriate affect.    Lab Results:  Recent Labs    10/20/24 1353 10/20/24 1404 10/21/24 0459  WBC 9.7  --  9.9  HGB 12.9 13.3 11.6*  HCT 40.7 39.0 37.3  PLT 303  --  306   BMET Recent Labs    10/20/24 1353 10/20/24 1404 10/21/24 0459  NA 140 141 140  K 3.9 3.8 4.3  CL 102 104 103  CO2 27  --  29  GLUCOSE 133* 138* 116*  BUN 16 18 19   CREATININE 1.11* 1.20* 1.29*  CALCIUM 9.7  --  9.2   PT/INR No results for input(s): LABPROT, INR in the last 72 hours. CMP     Component Value Date/Time   NA 140 10/21/2024 0459   NA 140 09/10/2020 1001   K 4.3 10/21/2024 0459   CL 103 10/21/2024 0459   CO2 29 10/21/2024 0459   GLUCOSE 116 (H) 10/21/2024 0459   BUN 19 10/21/2024 0459   BUN 26 09/10/2020 1001   CREATININE 1.29 (H) 10/21/2024 0459   CALCIUM 9.2 10/21/2024 0459   PROT 7.1 10/21/2024 0459   PROT 6.7 09/02/2019 0903   ALBUMIN 3.4 (L) 10/21/2024 0459   ALBUMIN  3.9 09/02/2019 0903   AST 25 10/21/2024 0459   ALT <5 10/21/2024 0459   ALKPHOS 99 10/21/2024 0459   BILITOT 0.6 10/21/2024 0459   BILITOT 0.4 09/02/2019 0903   GFRNONAA 40 (L) 10/21/2024 0459   GFRAA 52 (L) 09/10/2020 1001   Lipase     Component Value Date/Time   LIPASE 28 10/20/2024 1353       Studies/Results: CT ABDOMEN PELVIS W CONTRAST Result Date: 10/20/2024 EXAM: CT ABDOMEN AND PELVIS WITH CONTRAST 10/20/2024 03:17:45 PM TECHNIQUE: CT of the abdomen and pelvis was performed with the administration of 80 mL of iohexol (OMNIPAQUE) 300 MG/ML solution. Multiplanar reformatted images are provided for review. Automated exposure control, iterative reconstruction, and/or weight-based adjustment of the mA/kV was utilized to reduce the radiation dose to as low as reasonably achievable. COMPARISON: 12/12/2019 CLINICAL HISTORY: Bowel obstruction suspected. FINDINGS: LOWER CHEST: Moderate-sized hiatal hernia. LIVER: The liver is unremarkable. GALLBLADDER AND BILE DUCTS: Gallbladder is unremarkable. No biliary ductal dilatation. SPLEEN: No acute abnormality. PANCREAS: No acute abnormality. ADRENAL GLANDS: No acute abnormality. KIDNEYS, URETERS AND BLADDER: Right renal cyst is noted. No stones in the kidneys or ureters. No hydronephrosis. No perinephric or periureteral stranding. Urinary bladder is unremarkable. GI AND BOWEL: Stomach demonstrates no acute abnormality. Large periumbilical hernia is noted which contains  a portion of transverse colon but does not result in obstruction. Sigmoid diverticulosis without inflammation. Moderate diffuse small bowel dilatation is noted with some fecalization present concerning for distal obstruction. This appears to extend to the terminal ileum which appears to be severely thickened and extends into the ileocecal valve. This may represent inflammation although mass cannot be excluded. PERITONEUM AND RETROPERITONEUM: No ascites. No free air. VASCULATURE: Aorta is  normal in caliber. Aortic atherosclerosis. LYMPH NODES: No lymphadenopathy. REPRODUCTIVE ORGANS: No acute abnormality. BONES AND SOFT TISSUES: No acute osseous abnormality. No focal soft tissue abnormality. IMPRESSION: 1. Moderate diffuse small bowel dilatation with fecalization, concerning for distal obstruction, likely at the terminal ileum; severe terminal ileal wall thickening with extension into the ileocecal valve, with differential including inflammatory etiology versus mass. 2. Large periumbilical hernia containing a portion of the transverse colon without obstruction. 3. Sigmoid diverticulosis without inflammation. Electronically signed by: Lynwood Seip MD 10/20/2024 03:46 PM EST RP Workstation: [id number]    Anti-infectives: Anti-infectives (From admission, onward)    None        Assessment/Plan Jennifer [name] is an 89 y.o. female with HTN, HLD, hypothyroidism, depressoin, PAD, CAD here with possible partial small bowel obstruction versus ileus   -CT scan with mild to moderate thickening of the terminal ileum on CT scan with mucosal enhancement. Could have some sort of terminal ileitis and/or even gastroenteritis. Large periumbilical hernia containing portion of the transverse colon without obstruction. Sigmoid diverticulosis without inflammation. -Xray pending for today -Abdominal pain has improved. Having flatulence. No nausea or vomiting.  -Will continue to hold off on NGT placement. If she experiences nausea/vomiting, would recommend placement.  -Keep NPO -Hopefully, this self resolves with nonoperative management.  -Will continue to follow  FEN: NPO; IVF per primary team VTE: Lovenox ID: None currently.    LOS: 1 day   I reviewed nursing notes, last 24 h vitals and pain scores, last 48 h intake and output, last 24 h labs and trends, and last 24 h imaging results.  This care required moderate level of medical decision making.    Jennifer [name], [facility] 10/21/2024, 9:06 AM Please see Amion for pager number during day hours 7:00am-4:30pm

## 2024-10-21 NOTE — ED Notes (Signed)
 Unable to obtain hospital bed for pt per DR request.

## 2024-10-21 NOTE — Progress Notes (Signed)
 PROGRESS NOTE    Jennifer [name]  FMW:[id number] DOB: 1934/08/16 DOA: 10/20/2024 PCP: Cloria Annabella CROME, DO   Brief Narrative: 89 year old with past medical history significant for peripheral vascular disease, obstructive sleep apnea, on CPAP, depression, GERD, CAD with stable angina, history of PCI to LAD 2009, repeat cath 2014 due to accelerated angina and stent was patent.  Hypertension pulmonary hypertension, chronic hypoxic respiratory failure on 2 L of oxygen presented with abdominal pain, nausea.  CT with finding concerning for SBO.    Assessment & Plan:   Principal Problem:   SBO (small bowel obstruction) (HCC)  1-SBO:  - Patient presented with abdominal pain, left side, associated with nausea. - CT abdomen and pelvis showed:  Moderate diffuse small bowel dilatation with fecalization, concerning for distal obstruction, likely at the terminal ileum; severe terminal ileal wall thickening with extension into the ileocecal valve, with differential including inflammatory etiology versus mass. - NPO. Awaiting surgery recommendation -Continue with IV fluids. -IV Protonix. -General Surgery consulted, plan to insert NG tube if patient starts vomiting. -KUB: Diffuse gaseous distention of small bowel, decreased when comparing to scout film from CT scan 1 day prior. -report no abdominal pain, passing gas.   Chronic hypoxic respiratory failure Pulmonary hypertension - Plan to hold Trelegy at this point, I do think that she is going to be able to provided good  effort for inhaler. - Continue with Pulmicort, Brovana, DuoNebs.   History of CAD s/p stent LAD 2009, history of angina: -Continue with  Ranexa , will see if she is able to tolerate it.  -As needed nitroglycerin.    Hypertension: - Hold lisinopril in the setting of NPO. - PRN  hydralazine   Hypothyroidism: - Will need to resume Synthroid when able to tolerate orals   History of CAD status post stent LAD  On Ranexa .    Depression: Resume Lexapro when able to tolerate oral Hold Remeron   GERD; Start PPI     Estimated body mass index is 47.31 kg/m as calculated from the following:   Height as of 08/01/24: 4' 5 (1.346 m).   Weight as of 08/01/24: 85.7 kg.   DVT prophylaxis: Lovenox Code Status: DNR Family Communication: care discussed with patient and daughter  Disposition Plan:  Status is: Inpatient Remains inpatient appropriate because: management of SBO    Consultants:  Surgery   Procedures:  none  Antimicrobials:    Subjective: Alert, conversant. She wants to eat. Denies abdominal pain. She report passing gas.    Objective: Vitals:   10/21/24 0015 10/21/24 0027 10/21/24 0115 10/21/24 0422  BP: 131/84  (!) 157/81 (!) 141/77  Pulse: 79  81 79  Resp: 20  19 20   Temp:  98 F (36.7 C)  98.2 F (36.8 C)  TempSrc:      SpO2: 100%  99% 96%   No intake or output data in the 24 hours ending 10/21/24 0804 There were no vitals filed for this visit.  Examination:  General exam: Appears calm and comfortable  Respiratory system: Clear to auscultation. Respiratory effort normal. Cardiovascular system: S1 & S2 heard, RRR.  Gastrointestinal system: Abdomen is nondistended, soft and nontender. No organomegaly or masses felt. Normal bowel sounds heard. Central nervous system: Alert and oriented.  Extremities: Symmetric 5 x 5 power.   Data Reviewed: I have personally reviewed following labs and imaging studies  CBC: Recent Labs  Lab 10/20/24 1353 10/20/24 1404 10/21/24 0459  WBC 9.7  --  9.9  NEUTROABS 8.5*  --   --  HGB 12.9 13.3 11.6*  HCT 40.7 39.0 37.3  MCV 90.6  --  91.9  PLT 303  --  306   Basic Metabolic Panel: Recent Labs  Lab 10/20/24 1353 10/20/24 1404 10/21/24 0459  NA 140 141 140  K 3.9 3.8 4.3  CL 102 104 103  CO2 27  --  29  GLUCOSE 133* 138* 116*  BUN 16 18 19   CREATININE 1.11* 1.20* 1.29*  CALCIUM 9.7  --  9.2   GFR: CrCl cannot be calculated  (Unknown ideal weight.). Liver Function Tests: Recent Labs  Lab 10/20/24 1353 10/21/24 0459  AST 24 25  ALT 8 <5  ALKPHOS 126 99  BILITOT 0.5 0.6  PROT 8.4* 7.1  ALBUMIN 4.0 3.4*   Recent Labs  Lab 10/20/24 1353  LIPASE 28   No results for input(s): AMMONIA in the last 168 hours. Coagulation Profile: No results for input(s): INR, PROTIME in the last 168 hours. Cardiac Enzymes: No results for input(s): CKTOTAL, CKMB, CKMBINDEX, TROPONINI in the last 168 hours. BNP (last 3 results) No results for input(s): PROBNP in the last 8760 hours. HbA1C: No results for input(s): HGBA1C in the last 72 hours. CBG: No results for input(s): GLUCAP in the last 168 hours. Lipid Profile: No results for input(s): CHOL, HDL, LDLCALC, TRIG, CHOLHDL, LDLDIRECT in the last 72 hours. Thyroid Function Tests: No results for input(s): TSH, T4TOTAL, FREET4, T3FREE, THYROIDAB in the last 72 hours. Anemia Panel: No results for input(s): VITAMINB12, FOLATE, FERRITIN, TIBC, IRON, RETICCTPCT in the last 72 hours. Sepsis Labs: No results for input(s): PROCALCITON, LATICACIDVEN in the last 168 hours.  No results found for this or any previous visit (from the past 240 hours).       Radiology Studies: CT ABDOMEN PELVIS W CONTRAST Result Date: 10/20/2024 EXAM: CT ABDOMEN AND PELVIS WITH CONTRAST 10/20/2024 03:17:45 PM TECHNIQUE: CT of the abdomen and pelvis was performed with the administration of 80 mL of iohexol (OMNIPAQUE) 300 MG/ML solution. Multiplanar reformatted images are provided for review. Automated exposure control, iterative reconstruction, and/or weight-based adjustment of the mA/kV was utilized to reduce the radiation dose to as low as reasonably achievable. COMPARISON: 12/12/2019 CLINICAL HISTORY: Bowel obstruction suspected. FINDINGS: LOWER CHEST: Moderate-sized hiatal hernia. LIVER: The liver is unremarkable. GALLBLADDER AND BILE  DUCTS: Gallbladder is unremarkable. No biliary ductal dilatation. SPLEEN: No acute abnormality. PANCREAS: No acute abnormality. ADRENAL GLANDS: No acute abnormality. KIDNEYS, URETERS AND BLADDER: Right renal cyst is noted. No stones in the kidneys or ureters. No hydronephrosis. No perinephric or periureteral stranding. Urinary bladder is unremarkable. GI AND BOWEL: Stomach demonstrates no acute abnormality. Large periumbilical hernia is noted which contains a portion of transverse colon but does not result in obstruction. Sigmoid diverticulosis without inflammation. Moderate diffuse small bowel dilatation is noted with some fecalization present concerning for distal obstruction. This appears to extend to the terminal ileum which appears to be severely thickened and extends into the ileocecal valve. This may represent inflammation although mass cannot be excluded. PERITONEUM AND RETROPERITONEUM: No ascites. No free air. VASCULATURE: Aorta is normal in caliber. Aortic atherosclerosis. LYMPH NODES: No lymphadenopathy. REPRODUCTIVE ORGANS: No acute abnormality. BONES AND SOFT TISSUES: No acute osseous abnormality. No focal soft tissue abnormality. IMPRESSION: 1. Moderate diffuse small bowel dilatation with fecalization, concerning for distal obstruction, likely at the terminal ileum; severe terminal ileal wall thickening with extension into the ileocecal valve, with differential including inflammatory etiology versus mass. 2. Large periumbilical hernia containing a portion of the transverse  colon without obstruction. 3. Sigmoid diverticulosis without inflammation. Electronically signed by: Lynwood Seip MD 10/20/2024 03:46 PM EST RP Workstation: [id number]        Scheduled Meds:  arformoterol  15 mcg Nebulization BID   budesonide (PULMICORT) nebulizer solution  0.25 mg Nebulization BID   enoxaparin (LOVENOX) injection  40 mg Subcutaneous Q24H   ipratropium-albuterol   3 mL Nebulization TID   pantoprazole  (PROTONIX) IV  40 mg Intravenous Q12H   ranolazine   500 mg Oral BID   Continuous Infusions:  lactated ringers  75 mL/hr at 10/20/24 1734     LOS: 1 day    Time spent: 35 Minutes    Tagen Milby A Antjuan Rothe, MD [facility]   If 7PM-7AM, please contact night-coverage [email or web address]  10/21/2024, 8:04 AM

## 2024-10-21 NOTE — TOC Initial Note (Signed)
 Transition of Care [facility]) - Initial/Assessment Note    Patient Details  Name: Jennifer [name] MRN: [id number] Date of Birth: 1934-02-26  Transition of Care [facility]) CM/SW Contact:    Bascom Service, RN Phone Number: 10/21/2024, 3:09 PM  Clinical Narrative: spoke to Iris(dtr) about d/c plans-d/c home;[facility]x week;has home 02-has travel tank;Crystal (CSW) from [place] [facility] following.Has own transport home.                 Expected Discharge Plan: Home/Self Care Barriers to Discharge: Continued Medical Work up   Patient Goals and CMS Choice Patient states their goals for this hospitalization and ongoing recovery are:: Home CMS Medicare.gov Compare Post Acute Care list provided to:: Patient Represenative (must comment) (dtr Iris) Choice offered to / list presented to : Adult Children [facility] ownership interest in [facility].provided to:: Adult Children    Expected Discharge Plan and Services   Discharge Planning Services: CM Consult Post Acute Care Choice: Resumption of Svcs/PTA Provider Living arrangements for the past 2 months: Single Family Home                                      Prior Living Arrangements/Services Living arrangements for the past 2 months: Single Family Home Lives with:: Adult Children   Do you feel safe going back to the place where you live?: Yes          Current home services: DME ([facility]home 02;cane,rw,w/c,rollator)    Activities of Daily Living      Permission Sought/Granted Permission sought to share information with : Case Manager                Emotional Assessment              Admission diagnosis:  SBO (small bowel obstruction) (HCC) [K56.609] Umbilical hernia without obstruction and without gangrene [K42.9] Patient Active Problem List   Diagnosis Date Noted   SBO (small bowel obstruction) (HCC) 10/20/2024   Chronic respiratory failure with hypoxia, on home O2 therapy (HCC)  07/12/2021   Pulmonary hypertension, unspecified (HCC) 07/14/2018   Coronary artery disease involving native coronary artery of native heart with angina pectoris 06/05/2018   Dyspnea on exertion 06/05/2018   Hyperlipidemia LDL goal <70 06/05/2018   Essential hypertension 06/05/2018   PCP:  Cloria Annabella CROME, DO Pharmacy:  No Pharmacies Listed    Social Drivers of Health (SDOH) Social History: SDOH Screenings   Tobacco Use: Low Risk  (08/01/2024)   SDOH Interventions:     Readmission Risk Interventions     No data to display

## 2024-10-22 ENCOUNTER — Other Ambulatory Visit: Payer: Self-pay

## 2024-10-22 ENCOUNTER — Encounter (HOSPITAL_COMMUNITY): Payer: Self-pay | Admitting: Internal Medicine

## 2024-10-22 ENCOUNTER — Inpatient Hospital Stay (HOSPITAL_COMMUNITY): Payer: Medicare (Managed Care)

## 2024-10-22 DIAGNOSIS — K56609 Unspecified intestinal obstruction, unspecified as to partial versus complete obstruction: Secondary | ICD-10-CM | POA: Diagnosis not present

## 2024-10-22 LAB — BASIC METABOLIC PANEL WITH GFR
Anion gap: 8 (ref 5–15)
BUN: 15 mg/dL (ref 8–23)
CO2: 28 mmol/L (ref 22–32)
Calcium: 8.9 mg/dL (ref 8.9–10.3)
Chloride: 107 mmol/L (ref 98–111)
Creatinine, Ser: 1.14 mg/dL — ABNORMAL HIGH (ref 0.44–1.00)
GFR, Estimated: 46 mL/min — ABNORMAL LOW (ref >60)
Glucose, Bld: 96 mg/dL (ref 70–99)
Potassium: 4.4 mmol/L (ref 3.5–5.1)
Sodium: 143 mmol/L (ref 135–145)

## 2024-10-22 LAB — MAGNESIUM: Magnesium: 2.3 mg/dL (ref 1.7–2.4)

## 2024-10-22 MED ORDER — AMLODIPINE BESYLATE 5 MG PO TABS
5.0000 mg | ORAL_TABLET | Freq: Every day | ORAL | Status: DC
  Administered 2024-10-22: 5 mg via ORAL
  Filled 2024-10-22: qty 1

## 2024-10-22 MED ORDER — SERTRALINE HCL 100 MG PO TABS
50.0000 mg | ORAL_TABLET | Freq: Every day | ORAL | Status: DC
  Administered 2024-10-22 – 2024-10-24 (×3): 50 mg via ORAL
  Filled 2024-10-22 (×4): qty 1

## 2024-10-22 MED ORDER — IPRATROPIUM-ALBUTEROL 0.5-2.5 (3) MG/3ML IN SOLN
3.0000 mL | Freq: Two times a day (BID) | RESPIRATORY_TRACT | Status: DC
  Administered 2024-10-22: 3 mL via RESPIRATORY_TRACT
  Filled 2024-10-22: qty 3

## 2024-10-22 MED ORDER — LEVOTHYROXINE SODIUM 75 MCG PO TABS
75.0000 ug | ORAL_TABLET | Freq: Every day | ORAL | Status: DC
  Administered 2024-10-23 – 2024-10-26 (×4): 75 ug via ORAL
  Filled 2024-10-22 (×4): qty 1

## 2024-10-22 MED ORDER — EZETIMIBE 10 MG PO TABS
10.0000 mg | ORAL_TABLET | Freq: Every day | ORAL | Status: DC
  Administered 2024-10-22 – 2024-10-24 (×3): 10 mg via ORAL
  Filled 2024-10-22 (×4): qty 1

## 2024-10-22 MED ORDER — BOOST / RESOURCE BREEZE PO LIQD CUSTOM
1.0000 | Freq: Three times a day (TID) | ORAL | Status: DC
  Administered 2024-10-22 – 2024-10-24 (×3): 1 via ORAL

## 2024-10-22 MED ORDER — ROSUVASTATIN CALCIUM 20 MG PO TABS
40.0000 mg | ORAL_TABLET | Freq: Every day | ORAL | Status: DC
  Administered 2024-10-22 – 2024-10-24 (×3): 40 mg via ORAL
  Filled 2024-10-22 (×3): qty 2

## 2024-10-22 NOTE — Plan of Care (Signed)
  Problem: Clinical Measurements: Goal: Ability to maintain clinical measurements within normal limits will improve Outcome: Progressing   Problem: Coping: Goal: Level of anxiety will decrease Outcome: Progressing   Problem: Elimination: Goal: Will not experience complications related to urinary retention Outcome: Progressing   Problem: Pain Managment: Goal: General experience of comfort will improve and/or be controlled Outcome: Progressing   Problem: Safety: Goal: Ability to remain free from injury will improve Outcome: Progressing

## 2024-10-22 NOTE — Evaluation (Signed)
 Physical Therapy Evaluation Patient Details Name: Jennifer [name] MRN: [id number] DOB: 1934/05/05 Today's Date: 10/22/2024  History of Present Illness  89 year old admitted on 10/20/24 with abdominal pain, nausea. CT with finding concerning for SBO. Pt with past medical history significant for peripheral vascular disease, obstructive sleep apnea, on CPAP, depression, GERD, CAD with stable angina, history of PCI to LAD 2009, repeat cath 2014 due to accelerated angina and stent was patent.  Hypertension pulmonary hypertension, chronic hypoxic respiratory failure on 2 L of oxygen  Clinical Impression  Pt admitted with above diagnosis. At baseline pt ambulates with rollator and has 24 hr care.  Today, pt with mild decrease in mobility but was able to ambulate 27' with RW and CGA.  Pt on 2 L O2 with sats >91%.  Pt expected to progress well. Pt currently with functional limitations due to the deficits listed below (see PT Problem List). Pt will benefit from acute skilled PT to increase their independence and safety with mobility to allow discharge.  Likely no PT needs at d/c.          If plan is discharge home, recommend the following: A little help with walking and/or transfers;A little help with bathing/dressing/bathroom;Assistance with cooking/housework;Help with stairs or ramp for entrance   Can travel by private vehicle        Equipment Recommendations None recommended by PT  Recommendations for Other Services       Functional Status Assessment Patient has had a recent decline in their functional status and demonstrates the ability to make significant improvements in function in a reasonable and predictable amount of time.     Precautions / Restrictions Precautions Precautions: Fall      Mobility  Bed Mobility Overal bed mobility: Needs Assistance Bed Mobility: Supine to Sit, Sit to Supine     Supine to sit: Min assist Sit to supine: Min assist   General bed mobility comments:  light min A    Transfers Overall transfer level: Needs assistance Equipment used: Rolling walker (2 wheels) Transfers: Sit to/from Stand Sit to Stand: Contact guard assist           General transfer comment: STS from bed and BSC.  Pt incontinent of urine upon standing.  Increased time for cleaning.    Ambulation/Gait Ambulation/Gait assistance: Contact guard assist Gait Distance (Feet): 80 Feet Assistive device: Rolling walker (2 wheels) Gait Pattern/deviations: Step-through pattern, Decreased stride length, Wide base of support Gait velocity: functional     General Gait Details: Mild SOB, CGA for safety  Stairs            Wheelchair Mobility     Tilt Bed    Modified Rankin (Stroke Patients Only)       Balance Overall balance assessment: Needs assistance Sitting-balance support: No upper extremity supported, Feet supported Sitting balance-Leahy Scale: Good     Standing balance support: Bilateral upper extremity supported, During functional activity, No upper extremity supported Standing balance-Leahy Scale: Fair Standing balance comment: Static stand without AD                             Pertinent Vitals/Pain Pain Assessment Pain Assessment: No/denies pain    Home Living Family/patient expects to be discharged to:: Private residence Living Arrangements: Children Available Help at Discharge: Family;Available 24 hours/day Type of Home: Apartment Home Access: Level entry       Home Layout: One level Home Equipment: Agricultural Consultant (2 wheels);Rollator (4  wheels);BSC/3in1;Grab bars - toilet;Grab bars - tub/shower Additional Comments: pt was on 2Lnc;lives with one of her dtrs;pt has lift chair at home and spends most of her day in the chair    Prior Function Prior Level of Function : Needs assist       Physical Assist : Mobility (physical);ADLs (physical) Mobility (physical): Gait ADLs (physical): Bathing;Dressing Mobility Comments:  was using rollator inside and outside the home;no hx of falls ADLs Comments: required assistance for LB dressing, wears compression socks and knee brace when outside of the home(doesn't wear it in the home);dtr assisted with IADL as needed     Extremity/Trunk Assessment   Upper Extremity Assessment Upper Extremity Assessment: Defer to OT evaluation    Lower Extremity Assessment Lower Extremity Assessment: Generalized weakness (arthritic changes notable)    Cervical / Trunk Assessment Cervical / Trunk Assessment: Normal  Communication   Communication Communication: No apparent difficulties    Cognition Arousal: Alert Behavior During Therapy: WFL for tasks assessed/performed   PT - Cognitive impairments: No apparent impairments                         Following commands: Intact       Cueing       General Comments General comments (skin integrity, edema, etc.): Pt on 2 L O2 with sats >91%    Exercises     Assessment/Plan    PT Assessment Patient needs continued PT services  PT Problem List Decreased strength;Decreased mobility;Decreased range of motion;Decreased activity tolerance;Decreased balance;Cardiopulmonary status limiting activity;Decreased knowledge of use of DME       PT Treatment Interventions DME instruction;Therapeutic exercise;Gait training;Functional mobility training;Therapeutic activities;Patient/family education;Balance training    PT Goals (Current goals can be found in the Care Plan section)  Acute Rehab PT Goals Patient Stated Goal: return home PT Goal Formulation: With patient Time For Goal Achievement: 11/05/24 Potential to Achieve Goals: Good    Frequency Min 2X/week     Co-evaluation               AM-PAC PT 6 Clicks Mobility  Outcome Measure Help needed turning from your back to your side while in a flat bed without using bedrails?: A Little Help needed moving from lying on your back to sitting on the side of a  flat bed without using bedrails?: A Little Help needed moving to and from a bed to a chair (including a wheelchair)?: A Little Help needed standing up from a chair using your arms (e.g., wheelchair or bedside chair)?: A Little Help needed to walk in hospital room?: A Little Help needed climbing 3-5 steps with a railing? : A Little 6 Click Score: 18    End of Session Equipment Utilized During Treatment: Gait belt Activity Tolerance: Patient tolerated treatment well Patient left: in bed;with call bell/phone within reach;with bed alarm set Nurse Communication: Mobility status PT Visit Diagnosis: Other abnormalities of gait and mobility (R26.89);Muscle weakness (generalized) (M62.81)    Time: 8374-8345 PT Time Calculation (min) (ACUTE ONLY): 29 min   Charges:   PT Evaluation $PT Eval Low Complexity: 1 Low PT Treatments $Gait Training: 8-22 mins PT General Charges $$ ACUTE PT VISIT: 1 Visit         Jennifer, PT Acute Rehab [facility] [phone]   Jennifer [name] 10/22/2024, 5:09 PM

## 2024-10-22 NOTE — Evaluation (Signed)
 Occupational Therapy Evaluation Patient Details Name: Jennifer [name] MRN: [id number] DOB: 1934-06-18 Today's Date: 10/22/2024   History of Present Illness   89 year old with past medical history significant for peripheral vascular disease, obstructive sleep apnea, on CPAP, depression, GERD, CAD with stable angina, history of PCI to LAD 2009, repeat cath 2014 due to accelerated angina and stent was patent.  Hypertension pulmonary hypertension, chronic hypoxic respiratory failure on 2 L of oxygen presented with abdominal pain, nausea.  CT with finding concerning for SBO.     Clinical Impressions PTA, pt was living at home with her daughter. Pt was on 2lnc at all times. She was independent with bathing, UB dressing, grooming and modified independent with functional mobility at rollator level. Pt's dtr assisted with LB dressing as pt uses compression socks and wears a knee brace when out of the home. Pt does not have a hx of falling. Pt demonstrates decreased activity tolerance, instability, generalized deconditioning and decreased endurance impacting safety and independence with ADL/IADL and functional mobility. She currently requires minA for functional mobility at RW level. She tolerated mobility <84min before having incresed wob and DoE 3/4, SpO2 93% on 2lnc. Will continue to follow acutely and progress appropriately. At this time, recommend HHOT at d/c.      If plan is discharge home, recommend the following:   A little help with walking and/or transfers;A little help with bathing/dressing/bathroom;Assistance with cooking/housework;Assist for transportation     Functional Status Assessment   Patient has had a recent decline in their functional status and demonstrates the ability to make significant improvements in function in a reasonable and predictable amount of time.     Equipment Recommendations   None recommended by OT     Recommendations for Other Services          Precautions/Restrictions   Precautions Precautions: Fall Restrictions Weight Bearing Restrictions Per Provider Order: No     Mobility Bed Mobility               General bed mobility comments: pt received in recliner and returned to recliner    Transfers Overall transfer level: Needs assistance Equipment used: Rolling walker (2 wheels) Transfers: Sit to/from Stand Sit to Stand: Min assist           General transfer comment: cues for safe hand placement, minA for stability      Balance Overall balance assessment: Needs assistance Sitting-balance support: No upper extremity supported, Feet supported Sitting balance-Leahy Scale: Good     Standing balance support: Bilateral upper extremity supported, During functional activity Standing balance-Leahy Scale: Fair                             ADL either performed or assessed with clinical judgement   ADL Overall ADL's : Needs assistance/impaired Eating/Feeding: Set up;Sitting   Grooming: Contact guard assist;Standing   Upper Body Bathing: Set up;Sitting   Lower Body Bathing: Minimal assistance;Sit to/from stand   Upper Body Dressing : Set up;Sitting   Lower Body Dressing: Minimal assistance;Sit to/from stand   Toilet Transfer: Minimal assistance;Rolling walker (2 wheels);Ambulation Toilet Transfer Details (indicate cue type and reason): simulated in the room;pt reports urgency and use of diapers at home;educated pt and family on use of voiding schedule Toileting- Clothing Manipulation and Hygiene: Minimal assistance       Functional mobility during ADLs: Minimal assistance;Rolling walker (2 wheels) General ADL Comments: on 2lnc at all times, SpO2 93% with exertion  96% at rest     Vision         Perception         Praxis         Pertinent Vitals/Pain Pain Assessment Pain Assessment: Faces Faces Pain Scale: Hurts a little bit Pain Location: L knee Pain Descriptors / Indicators:  Guarding Pain Intervention(s): Limited activity within patient's tolerance, Monitored during session     Extremity/Trunk Assessment Upper Extremity Assessment Upper Extremity Assessment: Generalized weakness   Lower Extremity Assessment Lower Extremity Assessment: Generalized weakness   Cervical / Trunk Assessment Cervical / Trunk Assessment: Normal   Communication Communication Communication: No apparent difficulties   Cognition Arousal: Alert Behavior During Therapy: WFL for tasks assessed/performed Cognition: No apparent impairments                               Following commands: Intact       Cueing  General Comments   Cueing Techniques: Verbal cues      Exercises     Shoulder Instructions      Home Living Family/patient expects to be discharged to:: Private residence Living Arrangements: Children Available Help at Discharge: Family;Available 24 hours/day Type of Home: Apartment Home Access: Level entry     Home Layout: One level     Bathroom Shower/Tub: Producer, Television/film/video: Handicapped height Bathroom Accessibility: Yes How Accessible: Accessible via wheelchair;Accessible via walker Home Equipment: Rolling Walker (2 wheels);Rollator (4 wheels);BSC/3in1;Grab bars - toilet;Grab bars - tub/shower   Additional Comments: pt was on 2Lnc;lives with one of her dtrs;pt has lift chair at home and spends most of her day in the chair      Prior Functioning/Environment Prior Level of Function : Needs assist       Physical Assist : Mobility (physical);ADLs (physical) Mobility (physical): Gait ADLs (physical): Bathing;Dressing Mobility Comments: was using rollator inside and outside the home;no hx of falls ADLs Comments: required assistance for LB dressing, wears compression socks and knee brace when outside of the home(doesn't wear it in the home);dtr assisted with IADL as needed    OT Problem List: Decreased activity  tolerance;Impaired balance (sitting and/or standing);Decreased safety awareness   OT Treatment/Interventions: Self-care/ADL training;Energy conservation;Therapeutic activities;Patient/family education;Balance training      OT Goals(Current goals can be found in the care plan section)   Acute Rehab OT Goals Patient Stated Goal: to go home OT Goal Formulation: With patient Time For Goal Achievement: 11/05/24 Potential to Achieve Goals: Good ADL Goals Pt Will Perform Grooming: with set-up;standing Pt Will Perform Upper Body Dressing: with set-up;sitting Pt Will Transfer to Toilet: with supervision;ambulating Additional ADL Goal #1: Pt will demonstrate improved activity tolerance completing ADL for 20-30 min with appropriate amount of rest breaks.   OT Frequency:  Min 1X/week    Co-evaluation              AM-PAC OT 6 Clicks Daily Activity     Outcome Measure Help from another person eating meals?: None Help from another person taking care of personal grooming?: A Little Help from another person toileting, which includes using toliet, bedpan, or urinal?: A Little Help from another person bathing (including washing, rinsing, drying)?: A Little Help from another person to put on and taking off regular upper body clothing?: A Little Help from another person to put on and taking off regular lower body clothing?: A Lot 6 Click Score: 18   End of Session  Equipment Utilized During Treatment: Rolling walker (2 wheels);Oxygen Nurse Communication: Mobility status  Activity Tolerance: Patient tolerated treatment well Patient left: in chair;with call bell/phone within reach;with chair alarm set;with family/visitor present  OT Visit Diagnosis: Other abnormalities of gait and mobility (R26.89);Muscle weakness (generalized) (M62.81)                Time: 8657-8595 OT Time Calculation (min): 22 min Charges:  OT General Charges $OT Visit: 1 Visit OT Evaluation $OT Eval Low Complexity:  1 Low  Masai Kidd OTR/L Acute Rehabilitation Services Office: [phone]   Verneita ONEIDA Moose 10/22/2024, 3:39 PM

## 2024-10-22 NOTE — Progress Notes (Signed)
 PROGRESS NOTE    Jennifer [name]  FMW:[id number] DOB: 14-Sep-1934 DOA: 10/20/2024 PCP: Cloria Annabella CROME, DO   Brief Narrative: 89 year old with past medical history significant for peripheral vascular disease, obstructive sleep apnea, on CPAP, depression, GERD, CAD with stable angina, history of PCI to LAD 2009, repeat cath 2014 due to accelerated angina and stent was patent.  Hypertension pulmonary hypertension, chronic hypoxic respiratory failure on 2 L of oxygen presented with abdominal pain, nausea.  CT with finding concerning for SBO.    Assessment & Plan:   Principal Problem:   SBO (small bowel obstruction) (HCC)  1-SBO:  - Patient presented with abdominal pain, left side, associated with nausea. - CT abdomen and pelvis showed:  Moderate diffuse small bowel dilatation with fecalization, concerning for distal obstruction, likely at the terminal ileum; severe terminal ileal wall thickening with extension into the ileocecal valve, with differential including inflammatory etiology versus mass. -Continue with IV fluids. -IV Protonix. -General Surgery consulted, plan to insert NG tube if patient starts vomiting. -KUB: Diffuse gaseous distention of small bowel, decreased when comparing to scout film from CT scan 1 day prior. -plan to start clear liquid diet.   Chronic hypoxic respiratory failure Pulmonary hypertension - Plan to hold Trelegy at this point, I do think that she is going to be able to provided good  effort for inhaler. - Continue with Pulmicort, Brovana, DuoNebs.   History of CAD s/p stent LAD 2009, history of angina: -Continue with  Ranexa  -As needed nitroglycerin.    Hypertension: - Hold lisinopril in the setting of NPO. - PRN  hydralazine  -Resume Norvasc .   Hypothyroidism: -Resume synthroid.   History of CAD status post stent LAD  On Ranexa . Resume Zetia, crestor.   Depression: Resume lexapro, and Zoloft.    GERD; Start PPI     Estimated body mass  index is 36.33 kg/m as calculated from the following:   Height as of this encounter: 5' 2 (1.575 m).   Weight as of this encounter: 90.1 kg.   DVT prophylaxis: Lovenox Code Status: DNR Family Communication: care discussed with patient and daughter 11/14 Disposition Plan:  Status is: Inpatient Remains inpatient appropriate because: management of SBO    Consultants:  Surgery   Procedures:  none  Antimicrobials:    Subjective: Her IV got infiltrated.  She is passing gas.  Abdominal pain has resolved.   Objective: Vitals:   10/21/24 1828 10/21/24 2225 10/22/24 0212 10/22/24 0512  BP: (!) 129/56 131/65 133/60 (!) 146/64  Pulse: 81 85 89 86  Resp: (!) 21 (!) 24 16 20   Temp: 98.2 F (36.8 C) 97.9 F (36.6 C) 98.8 F (37.1 C) 98.3 F (36.8 C)  TempSrc: Oral     SpO2: 100% 100% 100% 98%  Weight:      Height:        Intake/Output Summary (Last 24 hours) at 10/22/2024 0731 Last data filed at 10/22/2024 0600 Gross per 24 hour  Intake 2075.55 ml  Output 1000 ml  Net 1075.55 ml   Filed Weights   10/21/24 1433  Weight: 90.1 kg    Examination:  General exam: NAD Respiratory system: CTA Cardiovascular system: S 1, S 2 RRR Gastrointestinal system: BS present, soft, nt Central nervous system: Alert Extremities: Symmetric 5 x 5 power.   Data Reviewed: I have personally reviewed following labs and imaging studies  CBC: Recent Labs  Lab 10/20/24 1353 10/20/24 1404 10/21/24 0459  WBC 9.7  --  9.9  NEUTROABS 8.5*  --   --  HGB 12.9 13.3 11.6*  HCT 40.7 39.0 37.3  MCV 90.6  --  91.9  PLT 303  --  306   Basic Metabolic Panel: Recent Labs  Lab 10/20/24 1353 10/20/24 1404 10/21/24 0459 10/22/24 0524  NA 140 141 140 143  K 3.9 3.8 4.3 4.4  CL 102 104 103 107  CO2 27  --  29 28  GLUCOSE 133* 138* 116* 96  BUN 16 18 19 15   CREATININE 1.11* 1.20* 1.29* 1.14*  CALCIUM 9.7  --  9.2 8.9  MG  --   --   --  2.3   GFR: Estimated Creatinine Clearance:  35.6 mL/min (A) (by C-G formula based on SCr of 1.14 mg/dL (H)). Liver Function Tests: Recent Labs  Lab 10/20/24 1353 10/21/24 0459  AST 24 25  ALT 8 <5  ALKPHOS 126 99  BILITOT 0.5 0.6  PROT 8.4* 7.1  ALBUMIN 4.0 3.4*   Recent Labs  Lab 10/20/24 1353  LIPASE 28   No results for input(s): AMMONIA in the last 168 hours. Coagulation Profile: No results for input(s): INR, PROTIME in the last 168 hours. Cardiac Enzymes: No results for input(s): CKTOTAL, CKMB, CKMBINDEX, TROPONINI in the last 168 hours. BNP (last 3 results) No results for input(s): PROBNP in the last 8760 hours. HbA1C: No results for input(s): HGBA1C in the last 72 hours. CBG: No results for input(s): GLUCAP in the last 168 hours. Lipid Profile: No results for input(s): CHOL, HDL, LDLCALC, TRIG, CHOLHDL, LDLDIRECT in the last 72 hours. Thyroid Function Tests: No results for input(s): TSH, T4TOTAL, FREET4, T3FREE, THYROIDAB in the last 72 hours. Anemia Panel: No results for input(s): VITAMINB12, FOLATE, FERRITIN, TIBC, IRON, RETICCTPCT in the last 72 hours. Sepsis Labs: No results for input(s): PROCALCITON, LATICACIDVEN in the last 168 hours.  No results found for this or any previous visit (from the past 240 hours).       Radiology Studies: DG Abd Portable 1V Result Date: 10/22/2024 EXAM: 1 VIEW XRAY OF THE ABDOMEN 10/22/2024 05:40:00 AM COMPARISON: Portable abdomen flat plate exam dated 10/21/2024 09:34 AM. CLINICAL HISTORY: SBO (small bowel obstruction) (HCC). FINDINGS: BOWEL: There is still mild dilatation of the lower abdominal small bowel segments up to 2.9 cm, previously 2.7 cm with very little if any change. There previously was a large amount of stool in the right-sided colon, which is not seen today. SOFT TISSUES: No opaque urinary calculi. BONES: Fusion hardware is again noted in the lumbar spine. IMPRESSION: 1. Mild dilatation of lower  abdominal small bowel segments up to 2.9 cm, unchanged from prior within measurement variability. Electronically signed by: Francis Quam MD 10/22/2024 05:54 AM EST RP Workstation: [id number]   DG Abd Portable 1V Result Date: 10/21/2024 CLINICAL DATA:  Small bowel obstruction. EXAM: PORTABLE ABDOMEN - 1 VIEW COMPARISON:  10/18/2024 FINDINGS: Diffuse gaseous distention of small bowel noted, decreased when comparing to scout film from CT scan 1 day prior. Stool is visible in the right colon. Contrast material in the bladder is compatible with yesterday's CT scan. IMPRESSION: Diffuse gaseous distention of small bowel, decreased when comparing to scout film from CT scan 1 day prior. Electronically Signed   By: Camellia Candle M.D.   On: 10/21/2024 09:59   CT ABDOMEN PELVIS W CONTRAST Result Date: 10/20/2024 EXAM: CT ABDOMEN AND PELVIS WITH CONTRAST 10/20/2024 03:17:45 PM TECHNIQUE: CT of the abdomen and pelvis was performed with the administration of 80 mL of iohexol (OMNIPAQUE) 300 MG/ML solution. Multiplanar reformatted images  are provided for review. Automated exposure control, iterative reconstruction, and/or weight-based adjustment of the mA/kV was utilized to reduce the radiation dose to as low as reasonably achievable. COMPARISON: 12/12/2019 CLINICAL HISTORY: Bowel obstruction suspected. FINDINGS: LOWER CHEST: Moderate-sized hiatal hernia. LIVER: The liver is unremarkable. GALLBLADDER AND BILE DUCTS: Gallbladder is unremarkable. No biliary ductal dilatation. SPLEEN: No acute abnormality. PANCREAS: No acute abnormality. ADRENAL GLANDS: No acute abnormality. KIDNEYS, URETERS AND BLADDER: Right renal cyst is noted. No stones in the kidneys or ureters. No hydronephrosis. No perinephric or periureteral stranding. Urinary bladder is unremarkable. GI AND BOWEL: Stomach demonstrates no acute abnormality. Large periumbilical hernia is noted which contains a portion of transverse colon but does not result in  obstruction. Sigmoid diverticulosis without inflammation. Moderate diffuse small bowel dilatation is noted with some fecalization present concerning for distal obstruction. This appears to extend to the terminal ileum which appears to be severely thickened and extends into the ileocecal valve. This may represent inflammation although mass cannot be excluded. PERITONEUM AND RETROPERITONEUM: No ascites. No free air. VASCULATURE: Aorta is normal in caliber. Aortic atherosclerosis. LYMPH NODES: No lymphadenopathy. REPRODUCTIVE ORGANS: No acute abnormality. BONES AND SOFT TISSUES: No acute osseous abnormality. No focal soft tissue abnormality. IMPRESSION: 1. Moderate diffuse small bowel dilatation with fecalization, concerning for distal obstruction, likely at the terminal ileum; severe terminal ileal wall thickening with extension into the ileocecal valve, with differential including inflammatory etiology versus mass. 2. Large periumbilical hernia containing a portion of the transverse colon without obstruction. 3. Sigmoid diverticulosis without inflammation. Electronically signed by: Lynwood Seip MD 10/20/2024 03:46 PM EST RP Workstation: [id number]        Scheduled Meds:  arformoterol  15 mcg Nebulization BID   budesonide (PULMICORT) nebulizer solution  0.25 mg Nebulization BID   enoxaparin (LOVENOX) injection  40 mg Subcutaneous Q24H   ipratropium-albuterol   3 mL Nebulization BID   pantoprazole (PROTONIX) IV  40 mg Intravenous Q12H   ranolazine   500 mg Oral BID   Continuous Infusions:  lactated ringers  75 mL/hr at 10/22/24 0447     LOS: 2 days    Time spent: 35 Minutes    Presly Steinruck A Sidrah Harden, MD [facility]   If 7PM-7AM, please contact night-coverage [email or web address]  10/22/2024, 7:31 AM

## 2024-10-22 NOTE — Progress Notes (Signed)
 Progress Note     Subjective: Patient reports that abdominal pain is gone. Having flatulence. Denies BM since hospitalization. Denies nausea and vomiting.    Objective: Vital signs in last 24 hours: Temp:  [97.9 F (36.6 C)-98.8 F (37.1 C)] 98.3 F (36.8 C) (11/15 0512) Pulse Rate:  [81-89] 86 (11/15 0512) Resp:  [16-24] 20 (11/15 0512) BP: (107-146)/(56-76) 146/64 (11/15 0512) SpO2:  [96 %-100 %] 96 % (11/15 0859) Weight:  [90.1 kg] 90.1 kg (11/14 1433) Last BM Date : 10/20/24  Intake/Output from previous day: 11/14 0701 - 11/15 0700 In: 2075.6 [I.V.:2075.6] Out: 1000 [Urine:1000] Intake/Output this shift: No intake/output data recorded.  PE: General: Pleasant female who is laying in bed in NAD. Abd: Soft with min distention; no TTP. Inicisional hernia is reducible. No rebound tenderness or guarding.  Skin: Warm and dry. Psych: A&Ox3 with an appropriate affect.    Lab Results:  Recent Labs    10/20/24 1353 10/20/24 1404 10/21/24 0459  WBC 9.7  --  9.9  HGB 12.9 13.3 11.6*  HCT 40.7 39.0 37.3  PLT 303  --  306   BMET Recent Labs    10/21/24 0459 10/22/24 0524  NA 140 143  K 4.3 4.4  CL 103 107  CO2 29 28  GLUCOSE 116* 96  BUN 19 15  CREATININE 1.29* 1.14*  CALCIUM 9.2 8.9   PT/INR No results for input(s): LABPROT, INR in the last 72 hours. CMP     Component Value Date/Time   NA 143 10/22/2024 0524   NA 140 09/10/2020 1001   K 4.4 10/22/2024 0524   CL 107 10/22/2024 0524   CO2 28 10/22/2024 0524   GLUCOSE 96 10/22/2024 0524   BUN 15 10/22/2024 0524   BUN 26 09/10/2020 1001   CREATININE 1.14 (H) 10/22/2024 0524   CALCIUM 8.9 10/22/2024 0524   PROT 7.1 10/21/2024 0459   PROT 6.7 09/02/2019 0903   ALBUMIN 3.4 (L) 10/21/2024 0459   ALBUMIN 3.9 09/02/2019 0903   AST 25 10/21/2024 0459   ALT <5 10/21/2024 0459   ALKPHOS 99 10/21/2024 0459   BILITOT 0.6 10/21/2024 0459   BILITOT 0.4 09/02/2019 0903   GFRNONAA 46 (L) 10/22/2024 0524    GFRAA 52 (L) 09/10/2020 1001   Lipase     Component Value Date/Time   LIPASE 28 10/20/2024 1353       Studies/Results: DG Abd Portable 1V Result Date: 10/22/2024 EXAM: 1 VIEW XRAY OF THE ABDOMEN 10/22/2024 05:40:00 AM COMPARISON: Portable abdomen flat plate exam dated 10/21/2024 09:34 AM. CLINICAL HISTORY: SBO (small bowel obstruction) (HCC). FINDINGS: BOWEL: There is still mild dilatation of the lower abdominal small bowel segments up to 2.9 cm, previously 2.7 cm with very little if any change. There previously was a large amount of stool in the right-sided colon, which is not seen today. SOFT TISSUES: No opaque urinary calculi. BONES: Fusion hardware is again noted in the lumbar spine. IMPRESSION: 1. Mild dilatation of lower abdominal small bowel segments up to 2.9 cm, unchanged from prior within measurement variability. Electronically signed by: Francis Quam MD 10/22/2024 05:54 AM EST RP Workstation: [id number]   DG Abd Portable 1V Result Date: 10/21/2024 CLINICAL DATA:  Small bowel obstruction. EXAM: PORTABLE ABDOMEN - 1 VIEW COMPARISON:  10/18/2024 FINDINGS: Diffuse gaseous distention of small bowel noted, decreased when comparing to scout film from CT scan 1 day prior. Stool is visible in the right colon. Contrast material in the bladder is compatible with yesterday's  CT scan. IMPRESSION: Diffuse gaseous distention of small bowel, decreased when comparing to scout film from CT scan 1 day prior. Electronically Signed   By: Camellia Candle M.D.   On: 10/21/2024 09:59   CT ABDOMEN PELVIS W CONTRAST Result Date: 10/20/2024 EXAM: CT ABDOMEN AND PELVIS WITH CONTRAST 10/20/2024 03:17:45 PM TECHNIQUE: CT of the abdomen and pelvis was performed with the administration of 80 mL of iohexol (OMNIPAQUE) 300 MG/ML solution. Multiplanar reformatted images are provided for review. Automated exposure control, iterative reconstruction, and/or weight-based adjustment of the mA/kV was utilized to reduce  the radiation dose to as low as reasonably achievable. COMPARISON: 12/12/2019 CLINICAL HISTORY: Bowel obstruction suspected. FINDINGS: LOWER CHEST: Moderate-sized hiatal hernia. LIVER: The liver is unremarkable. GALLBLADDER AND BILE DUCTS: Gallbladder is unremarkable. No biliary ductal dilatation. SPLEEN: No acute abnormality. PANCREAS: No acute abnormality. ADRENAL GLANDS: No acute abnormality. KIDNEYS, URETERS AND BLADDER: Right renal cyst is noted. No stones in the kidneys or ureters. No hydronephrosis. No perinephric or periureteral stranding. Urinary bladder is unremarkable. GI AND BOWEL: Stomach demonstrates no acute abnormality. Large periumbilical hernia is noted which contains a portion of transverse colon but does not result in obstruction. Sigmoid diverticulosis without inflammation. Moderate diffuse small bowel dilatation is noted with some fecalization present concerning for distal obstruction. This appears to extend to the terminal ileum which appears to be severely thickened and extends into the ileocecal valve. This may represent inflammation although mass cannot be excluded. PERITONEUM AND RETROPERITONEUM: No ascites. No free air. VASCULATURE: Aorta is normal in caliber. Aortic atherosclerosis. LYMPH NODES: No lymphadenopathy. REPRODUCTIVE ORGANS: No acute abnormality. BONES AND SOFT TISSUES: No acute osseous abnormality. No focal soft tissue abnormality. IMPRESSION: 1. Moderate diffuse small bowel dilatation with fecalization, concerning for distal obstruction, likely at the terminal ileum; severe terminal ileal wall thickening with extension into the ileocecal valve, with differential including inflammatory etiology versus mass. 2. Large periumbilical hernia containing a portion of the transverse colon without obstruction. 3. Sigmoid diverticulosis without inflammation. Electronically signed by: Lynwood Seip MD 10/20/2024 03:46 PM EST RP Workstation: [id number]     Anti-infectives: Anti-infectives (From admission, onward)    None        Assessment/Plan Cydni Gancarz is an 89 y.o. female with HTN, HLD, hypothyroidism, depressoin, PAD, CAD here with possible partial small bowel obstruction versus ileus   -CT scan with mild to moderate thickening of the terminal ileum on CT scan with mucosal enhancement. Could have some sort of terminal ileitis and/or even gastroenteritis. Large periumbilical hernia containing portion of the transverse colon without obstruction. Sigmoid diverticulosis without inflammation. -Xray with some improvement  -Will try clears today -Hopefully, this self resolves with nonoperative management.  -Will continue to follow  FEN: CLD; IVF per primary team VTE: Lovenox ID: None currently.    LOS: 2 days   I reviewed nursing notes, last 24 h vitals and pain scores, last 48 h intake and output, last 24 h labs and trends, and last 24 h imaging results.  This care required moderate level of medical decision making.    Tracyann Duffell C Neima Lacross, MD  [facility] 10/22/2024, 10:13 AM Please see Amion for pager number during day hours 7:00am-4:30pm

## 2024-10-23 ENCOUNTER — Inpatient Hospital Stay (HOSPITAL_COMMUNITY): Payer: Medicare (Managed Care)

## 2024-10-23 DIAGNOSIS — K56609 Unspecified intestinal obstruction, unspecified as to partial versus complete obstruction: Secondary | ICD-10-CM | POA: Diagnosis not present

## 2024-10-23 DIAGNOSIS — I480 Paroxysmal atrial fibrillation: Secondary | ICD-10-CM | POA: Diagnosis not present

## 2024-10-23 DIAGNOSIS — I10 Essential (primary) hypertension: Secondary | ICD-10-CM | POA: Diagnosis not present

## 2024-10-23 DIAGNOSIS — J9621 Acute and chronic respiratory failure with hypoxia: Secondary | ICD-10-CM

## 2024-10-23 DIAGNOSIS — I5033 Acute on chronic diastolic (congestive) heart failure: Secondary | ICD-10-CM

## 2024-10-23 DIAGNOSIS — G4733 Obstructive sleep apnea (adult) (pediatric): Secondary | ICD-10-CM

## 2024-10-23 DIAGNOSIS — I2510 Atherosclerotic heart disease of native coronary artery without angina pectoris: Secondary | ICD-10-CM | POA: Diagnosis not present

## 2024-10-23 LAB — PRO BRAIN NATRIURETIC PEPTIDE: Pro Brain Natriuretic Peptide: 733 pg/mL — ABNORMAL HIGH (ref <300.0)

## 2024-10-23 LAB — BASIC METABOLIC PANEL WITH GFR
Anion gap: 9 (ref 5–15)
BUN: 15 mg/dL (ref 8–23)
CO2: 26 mmol/L (ref 22–32)
Calcium: 9.2 mg/dL (ref 8.9–10.3)
Chloride: 101 mmol/L (ref 98–111)
Creatinine, Ser: 1.29 mg/dL — ABNORMAL HIGH (ref 0.44–1.00)
GFR, Estimated: 40 mL/min — ABNORMAL LOW (ref >60)
Glucose, Bld: 153 mg/dL — ABNORMAL HIGH (ref 70–99)
Potassium: 4.6 mmol/L (ref 3.5–5.1)
Sodium: 137 mmol/L (ref 135–145)

## 2024-10-23 LAB — PHOSPHORUS: Phosphorus: 2.2 mg/dL — ABNORMAL LOW (ref 2.5–4.6)

## 2024-10-23 LAB — CBC
HCT: 38.6 % (ref 36.0–46.0)
Hemoglobin: 12 g/dL (ref 12.0–15.0)
MCH: 28.2 pg (ref 26.0–34.0)
MCHC: 31.1 g/dL (ref 30.0–36.0)
MCV: 90.8 fL (ref 80.0–100.0)
Platelets: 273 K/uL (ref 150–400)
RBC: 4.25 MIL/uL (ref 3.87–5.11)
RDW: 13.6 % (ref 11.5–15.5)
WBC: 7.1 K/uL (ref 4.0–10.5)
nRBC: 0 % (ref 0.0–0.2)

## 2024-10-23 LAB — PROCALCITONIN: Procalcitonin: 0.33 ng/mL

## 2024-10-23 LAB — TSH: TSH: 2.38 u[IU]/mL (ref 0.350–4.500)

## 2024-10-23 LAB — MAGNESIUM: Magnesium: 2.3 mg/dL (ref 1.7–2.4)

## 2024-10-23 MED ORDER — SODIUM CHLORIDE 0.9 % IV SOLN
500.0000 mg | INTRAVENOUS | Status: DC
  Administered 2024-10-23 – 2024-10-26 (×4): 500 mg via INTRAVENOUS
  Filled 2024-10-23 (×5): qty 5

## 2024-10-23 MED ORDER — SODIUM PHOSPHATES 45 MMOLE/15ML IV SOLN
15.0000 mmol | Freq: Once | INTRAVENOUS | Status: AC
  Administered 2024-10-23: 15 mmol via INTRAVENOUS
  Filled 2024-10-23: qty 5

## 2024-10-23 MED ORDER — BISACODYL 10 MG RE SUPP
10.0000 mg | Freq: Once | RECTAL | Status: AC
  Administered 2024-10-23: 10 mg via RECTAL
  Filled 2024-10-23: qty 1

## 2024-10-23 MED ORDER — FUROSEMIDE 10 MG/ML IJ SOLN
40.0000 mg | Freq: Two times a day (BID) | INTRAMUSCULAR | Status: DC
  Administered 2024-10-23 (×2): 40 mg via INTRAVENOUS
  Filled 2024-10-23 (×2): qty 4

## 2024-10-23 MED ORDER — FUROSEMIDE 10 MG/ML IJ SOLN
INTRAMUSCULAR | Status: AC
  Filled 2024-10-23: qty 4

## 2024-10-23 MED ORDER — IPRATROPIUM-ALBUTEROL 0.5-2.5 (3) MG/3ML IN SOLN
3.0000 mL | Freq: Four times a day (QID) | RESPIRATORY_TRACT | Status: DC
  Administered 2024-10-23 – 2024-10-24 (×5): 3 mL via RESPIRATORY_TRACT
  Filled 2024-10-23 (×4): qty 3

## 2024-10-23 MED ORDER — FUROSEMIDE 10 MG/ML IJ SOLN
40.0000 mg | Freq: Once | INTRAMUSCULAR | Status: AC
  Administered 2024-10-23: 40 mg via INTRAVENOUS

## 2024-10-23 MED ORDER — IPRATROPIUM-ALBUTEROL 0.5-2.5 (3) MG/3ML IN SOLN
3.0000 mL | Freq: Two times a day (BID) | RESPIRATORY_TRACT | Status: DC
  Administered 2024-10-23: 3 mL via RESPIRATORY_TRACT
  Filled 2024-10-23: qty 3

## 2024-10-23 MED ORDER — DILTIAZEM HCL 25 MG/5ML IV SOLN
10.0000 mg | INTRAVENOUS | Status: AC
  Administered 2024-10-23: 10 mg via INTRAVENOUS
  Filled 2024-10-23: qty 5

## 2024-10-23 MED ORDER — METHYLPREDNISOLONE SODIUM SUCC 40 MG IJ SOLR
40.0000 mg | Freq: Every day | INTRAMUSCULAR | Status: DC
  Administered 2024-10-23 – 2024-10-24 (×2): 40 mg via INTRAVENOUS
  Filled 2024-10-23 (×2): qty 1

## 2024-10-23 MED ORDER — SODIUM CHLORIDE 0.9 % IV SOLN
2.0000 g | INTRAVENOUS | Status: DC
  Administered 2024-10-23 – 2024-10-25 (×3): 2 g via INTRAVENOUS
  Filled 2024-10-23 (×3): qty 20

## 2024-10-23 MED ORDER — DILTIAZEM HCL-DEXTROSE 125-5 MG/125ML-% IV SOLN (PREMIX)
5.0000 mg/h | INTRAVENOUS | Status: DC
  Administered 2024-10-23: 5 mg/h via INTRAVENOUS
  Administered 2024-10-24: 7.5 mg/h via INTRAVENOUS
  Filled 2024-10-23 (×3): qty 125

## 2024-10-23 MED ORDER — CARMEX CLASSIC LIP BALM EX OINT
TOPICAL_OINTMENT | CUTANEOUS | Status: DC | PRN
  Filled 2024-10-23: qty 10

## 2024-10-23 NOTE — Progress Notes (Signed)
 Rt took pt off BIPAP and placed on 3LPM [place]. MD and RN aware.

## 2024-10-23 NOTE — Progress Notes (Signed)
 Rapid Response Event Note   Reason for Call : pt HR 170's   Initial Focused Assessment: Found pt sitting in chair beside her bed, SOB.  Pt has [place] of 2 L.  Pt denies pain. Lung sounds expiratory wheezes.  Alert and able to follow commands. Pt is on 2 LNC.   Interventions: Pt put back to bed two assist. VS in flow sheet.  See orders received and initiated.  [facility], NP at bedside.          Plan of Care: Pt will remain in current location and bedside RN to initiate the orders received.  Continue to monitor and report.  Day RRT will follow-up on pt status.      Event Summary:   MD Notified: yes Call Time: 0613 Arrival Time: 0617 End Time: [id number]  Chaeli Judy Lavern, RN

## 2024-10-23 NOTE — Consult Note (Signed)
 Cardiology Consultation:   Patient ID: Jennifer [name]; [id number]; March 18, 1934   Admit date: 10/20/2024 Date of Consult: 10/23/2024  Primary Care Provider: Cloria Annabella CROME, DO Primary Cardiologist: Jennifer Bruckner, MD  Primary Electrophysiologist:  None   Patient Profile:   Jennifer [name] is a 89 y.o. female with a hx of coronary artery disease who is being seen today for the evaluation of atrial fibrillation at the request of Dr. Madelyne.  History of Present Illness:   Jennifer [name] the patient is a 89 year old history of peripheral vascular disease is, sleep apnea, coronary disease with a distant PCI to an LAD.  She presented complaining of abdominal pain.  She is found to have small bowel obstruction versus ileus.  Overnight she got up to go to the bathroom and became tachycardic.  She was found to be in atrial fibrillation.  She was given a Cardizem bolus.  There was no improvement so she was started on drip.  She has chronic hypoxic respiratory failure and is on 2 L.  She has 24-hour care at home and lives at home with her daughter..  She does ambulate with a walker.  Much of the history is per the Jennifer [name].  The patient is on CPAP and difficult to understand.  She does not complain of palpitations.  Her Jennifer [name] have not noticed any of this and she does take her blood pressure daily.  There has been no tacky arrhythmias.  She does not have presyncope or syncope.  She does not have chest pressure, neck or arm discomfort.  She has had no weight gain or edema.  She has not been having any chest discomfort and does not use any sublingual nitroglycerin.  Overnight she did go back into sinus rhythm and I reviewed these strips.  There was a very brief posttermination pause.  She is now back in sinus with PACs.  Past Medical History:  Diagnosis Date   Ambulates with cane    straight   At risk for falls    CAD (coronary artery disease)    Cataracts, bilateral    GERD  (gastroesophageal reflux disease)    Hypercholesterolemia    Hypertension    Hypothyroidism    Major depression    Musculoskeletal back pain    CHRONIC LEFT-SIDED DISCOMFORT   Obstructive sleep apnea on CPAP    uses CPAP nightly   Osteopenia    PVD (peripheral vascular disease)    Stable angina    SVD (spontaneous vaginal delivery)    x 2   Vitamin D deficiency    Wears glasses    Wears partial dentures    upper    Past Surgical History:  Procedure Laterality Date   ANKLE SURGERY Left    BACK SURGERY     CARDIAC CATHETERIZATION  06/2008   PCI to LAD with 3.0 x 12 mm DES   COLONOSCOPY     EYE SURGERY     remove cataracts - bilateral   MULTIPLE TOOTH EXTRACTIONS     upper teeth - upper dentures   OPEN REDUCTION INTERNAL FIXATION (ORIF) DISTAL RADIAL FRACTURE Left 05/16/2019   Procedure: OPEN REDUCTION INTERNAL FIXATION (ORIF) DISTAL RADIAL FRACTURE;  Surgeon: Sissy Cough, MD;  Location: [facility] OR;  Service: Orthopedics;  Laterality: Left;   SPINAL FUSION     UPPER GI ENDOSCOPY       Home Medications:  Prior to Admission medications   Medication Sig Start Date End Date Taking? Authorizing Provider  acetaminophen  (TYLENOL )  650 MG CR tablet Take 1,300 mg by mouth in the morning.   Yes [provider]  albuterol  (VENTOLIN  HFA) 108 (90 Base) MCG/ACT inhaler Inhale 2 puffs into the lungs every 6 (six) hours as needed for wheezing or shortness of breath. Patient taking differently: Inhale 2 puffs into the lungs 3 (three) times daily as needed for wheezing or shortness of breath. 11/29/19  Yes Olalere, Adewale A, MD  amLODipine  (NORVASC ) 5 MG tablet Take 5 mg by mouth daily.   Yes [provider]  aspirin EC 81 MG tablet Take 81 mg by mouth in the morning. CARDIAC EVENT PREVENTION   Yes [provider]  benzonatate  (TESSALON ) 200 MG capsule Take 1 capsule (200 mg total) by mouth 3 (three) times daily as needed for cough. 09/22/22  Yes Olalere, Adewale A,  MD  Cholecalciferol (VITAMIN D3) 5000 units TABS Take 5,000 Units by mouth daily with breakfast.    Yes [provider]  clopidogrel (PLAVIX) 75 MG tablet Take 75 mg by mouth daily.   Yes [provider]  ezetimibe (ZETIA) 10 MG tablet Take 10 mg by mouth daily.   Yes [provider]  Fluticasone -Umeclidin-Vilant (TRELEGY ELLIPTA ) 100-62.5-25 MCG/ACT AEPB Inhale 1 puff into the lungs daily. 03/19/23  Yes Olalere, Adewale A, MD  Hydrocortisone (PREPARATION H SOOTHING RELIEF EX) Place 1 application  rectally 4 (four) times daily as needed (for hemorrhoids).   Yes [provider]  ipratropium-albuterol  (DUONEB) 0.5-2.5 (3) MG/3ML SOLN Take 3 mLs by nebulization every 4 (four) hours as needed (for shortness of breath).   Yes [provider]  levothyroxine (SYNTHROID) 75 MCG tablet Take 75 mcg by mouth daily before breakfast.   Yes [provider]  OXYGEN Inhale 2 L/min into the lungs as needed (for shortness of breath).   Yes [provider]  pantoprazole (PROTONIX) 40 MG tablet Take 40 mg by mouth daily before breakfast.   Yes [provider]  ranolazine  (RANEXA ) 500 MG 12 hr tablet Take 500 mg by mouth in the morning and at bedtime.   Yes [provider]  rosuvastatin (CRESTOR) 40 MG tablet Take 40 mg by mouth daily.   Yes [provider]  sertraline (ZOLOFT) 50 MG tablet Take 50 mg by mouth daily.   Yes [provider]  Spacer/Aero-Holding Chambers (AEROCHAMBER MV) inhaler Use as instructed 11/29/19  Yes Olalere, Adewale A, MD  SYSTANE 0.4-0.3 % GEL ophthalmic gel Place 1 Application into both eyes 2 (two) times daily.   Yes [provider]  torsemide  (DEMADEX ) 20 MG tablet Take 1 tablet (20 mg total) by mouth 2 (two) times daily. Patient taking differently: Take 60 mg by mouth 2 (two) times daily. 09/10/20 10/20/24 Yes Lonni Slain, MD  vitamin E 45 MG (100 UNITS) capsule Take 100  Units by mouth daily.   Yes [provider]  nitroGLYCERIN (NITROSTAT) 0.4 MG SL tablet Place 0.4 mg under the tongue every 5 (five) minutes as needed for chest pain.    [provider]  traMADol (ULTRAM) 50 MG tablet Take 50 mg by mouth daily as needed for moderate pain.    [provider]    Inpatient Medications: Scheduled Meds:  arformoterol  15 mcg Nebulization BID   bisacodyl  10 mg Rectal Once   budesonide (PULMICORT) nebulizer solution  0.25 mg Nebulization BID   enoxaparin (LOVENOX) injection  40 mg Subcutaneous Q24H   ezetimibe  10 mg Oral Daily   feeding supplement  1 Container Oral TID BM   furosemide   40 mg Intravenous Q12H   ipratropium-albuterol   3 mL Nebulization BID   levothyroxine  75 mcg Oral QAC breakfast   methylPREDNISolone (SOLU-MEDROL) injection  40 mg Intravenous Daily   pantoprazole (PROTONIX) IV  40 mg Intravenous Q12H   ranolazine   500 mg Oral BID   rosuvastatin  40 mg Oral Daily   sertraline  50 mg Oral Daily   Continuous Infusions:  azithromycin     cefTRIAXone (ROCEPHIN)  IV 2 g (10/23/24 1005)   diltiazem (CARDIZEM) infusion 5 mg/hr (10/23/24 0703)   sodium phosphate  15 mmol in sodium chloride  0.9 % 250 mL infusion     PRN Meds: acetaminophen  **OR** acetaminophen , albuterol , antiseptic oral rinse, hydrALAZINE, HYDROcodone -acetaminophen , morphine injection, nitroGLYCERIN, ondansetron  **OR** ondansetron  (ZOFRAN ) IV  Allergies:   No Known Allergies  Social History:   Social History   Socioeconomic History   Marital status: Widowed    Spouse name: Not on file   Number of children: 2   Years of education: Not on file   Highest education level: Not on file  Occupational History   Not on file  Tobacco Use   Smoking status: Never   Smokeless tobacco: Never  Vaping Use   Vaping status: Never Used  Substance and Sexual Activity   Alcohol use: Never   Drug use: Never   Sexual activity: Not on file  Other Topics  Concern   Not on file  Social History Narrative   Not on file   Social Drivers of Health   Financial Resource Strain: Not on file  Food Insecurity: No Food Insecurity (10/21/2024)   Hunger Vital Sign    Worried About Running Out of Food in the Last Year: Never true    Ran Out of Food in the Last Year: Never true  Transportation Needs: No Transportation Needs (10/21/2024)   PRAPARE - Administrator, Civil Service (Medical): No    Lack of Transportation (Non-Medical): No  Physical Activity: Not on file  Stress: Not on file  Social Connections: Unknown (10/22/2024)   Social Connection and Isolation Panel    Frequency of Communication with Friends and Family: More than three times a week    Frequency of Social Gatherings with Friends and Family: More than three times a week    Attends Religious Services: More than 4 times per year    Active Member of [employer] or Organizations: Yes    Attends Banker Meetings: 1 to 4 times per year    Marital Status: Patient unable to answer  Intimate Partner Violence: Not At Risk (10/21/2024)   Humiliation, Afraid, Rape, and Kick questionnaire    Fear of Current or Ex-Partner: No    Emotionally Abused: No    Physically Abused: No    Sexually Abused: No    Family History:    Family History  Problem Relation Age of Onset   Heart disease Mother    Asthma Father    Heart disease Brother 41       CAD     ROS:  Please see the history of present illness.  As stated in the HPI and negative for all other systems.   Physical Exam/Data:   Vitals:   10/23/24 0813 10/23/24 0900 10/23/24 0955 10/23/24 1008  BP: (!) 169/82 (!) 149/78  133/66  Pulse: 95 90 92 92  Resp: (!) 21 (!) 25 (!) 27 (!) 33  Temp:  98.3 F (36.8  C)    TempSrc:  Oral    SpO2: 99% 100% 99% 97%  Weight:      Height:        Intake/Output Summary (Last 24 hours) at 10/23/2024 1056 Last data filed at 10/23/2024 0937 Gross per 24 hour  Intake 981.25  ml  Output 400 ml  Net 581.25 ml   Filed Weights   10/21/24 1433  Weight: 90.1 kg   Body mass index is 36.33 kg/m.  GENERAL:  Acutely ill appearing but not in distress HEENT:   Pupils equal round and reactive, fundi not visualized, oral mucosa unremarkable NECK:  No  jugular venous distention, waveform within normal limits, carotid upstroke brisk and symmetric, no bruits, no thyromegaly LYMPHATICS:  No cervical, inguinal adenopathy LUNGS:     Decreased breath sounds with scattered coarse crackles.  BACK:  No CVA tenderness CHEST:   Unremarkable HEART:  PMI not displaced or sustained,S1 and S2 within normal limits, no S3, no S4, no clicks, no rubs, no murmurs, distant heart sounds.  ABD: Positive bowel sounds, no rebound no guarding  EXT:  2 plus pulses throughout, no  edema, no cyanosis no clubbing SKIN:  No rashes no nodules NEURO:   Cranial nerves II through XII grossly intact, motor grossly intact throughout PSYCH:    Cognitively intact, oriented to person place and time   EKG:  The EKG was personally reviewed and demonstrates: Atrial fibrillation, rate 167, axis within normal limits, intervals within normal limits, nonspecific ST-T wave changes.  Telemetry:  Telemetry was personally reviewed and demonstrates:  NSR, PACs.   Relevant CV Studies:  02/05/2023  1. Left ventricular ejection fraction, by estimation, is 60 to 65%. The  left ventricle has normal function. The left ventricle has no regional  wall motion abnormalities. Left ventricular diastolic parameters were  normal. The average left ventricular  global longitudinal strain is -19.6 %. The global longitudinal strain is  normal.   2. Right ventricular systolic function is normal. The right ventricular  size is normal.   3. The mitral valve is abnormal. Trivial mitral valve regurgitation. No  evidence of mitral stenosis. Moderate mitral annular calcification.   4. Mean gradient 8 mmHg calculated AVA 2.1 cm2. The  aortic valve is  tricuspid. There is moderate calcification of the aortic valve. There is  moderate thickening of the aortic valve. Aortic valve regurgitation is not  visualized. Aortic valve  sclerosis/calcification is present, without any evidence of aortic  stenosis.   5. The inferior vena cava is dilated in size with >50% respiratory  variability, suggesting right atrial pressure of 8 mmHg.   Laboratory Data:  Chemistry Recent Labs  Lab 10/21/24 0459 10/22/24 0524 10/23/24 0735  NA 140 143 137  K 4.3 4.4 4.6  CL 103 107 101  CO2 29 28 26   GLUCOSE 116* 96 153*  BUN 19 15 15   CREATININE 1.29* 1.14* 1.29*  CALCIUM 9.2 8.9 9.2  GFRNONAA 40* 46* 40*  ANIONGAP 8 8 9     Recent Labs  Lab 10/20/24 1353 10/21/24 0459  PROT 8.4* 7.1  ALBUMIN 4.0 3.4*  AST 24 25  ALT 8 <5  ALKPHOS 126 99  BILITOT 0.5 0.6   Hematology Recent Labs  Lab 10/20/24 1353 10/20/24 1404 10/21/24 0459 10/23/24 0735  WBC 9.7  --  9.9 7.1  RBC 4.49  --  4.06 4.25  HGB 12.9 13.3 11.6* 12.0  HCT 40.7 39.0 37.3 38.6  MCV 90.6  --  91.9  90.8  MCH 28.7  --  28.6 28.2  MCHC 31.7  --  31.1 31.1  RDW 13.5  --  14.0 13.6  PLT 303  --  306 273   Cardiac EnzymesNo results for input(s): TROPONINI in the last 168 hours. No results for input(s): TROPIPOC in the last 168 hours.  BNP Recent Labs  Lab 10/23/24 0806  PROBNP 733.0*    DDimer No results for input(s): DDIMER in the last 168 hours.  Radiology/Studies:  DG Chest Port 1 View Result Date: 10/23/2024 EXAM: 1 VIEW(S) XRAY OF THE CHEST 10/23/2024 06:50:00 AM COMPARISON: PA and lateral radiographs of the chest dated 03/19/2023. CLINICAL HISTORY: SOB (shortness of breath). FINDINGS: LUNGS AND PLEURA: Prominent interstitial opacities are again demonstrated within the lungs bilaterally. Questionable small pleural effusions. No pneumothorax. HEART AND MEDIASTINUM: The heart is borderline in size. There is calcification within the aortic arch.  There is a mild hiatus hernia. BONES AND SOFT TISSUES: Patient is status post orthopedic fixation of the lumbar spine. IMPRESSION: 1. Prominent interstitial opacities bilaterally. 2. Questionable small pleural effusions. 3. Borderline heart size. 4. Calcification within the aortic arch. 5. Mild hiatus hernia. Electronically signed by: Evalene Coho MD 10/23/2024 07:19 AM EST RP Workstation: [id number]   DG Abd Portable 1V Result Date: 10/22/2024 EXAM: 1 VIEW XRAY OF THE ABDOMEN 10/22/2024 05:40:00 AM COMPARISON: Portable abdomen flat plate exam dated 10/21/2024 09:34 AM. CLINICAL HISTORY: SBO (small bowel obstruction) (HCC). FINDINGS: BOWEL: There is still mild dilatation of the lower abdominal small bowel segments up to 2.9 cm, previously 2.7 cm with very little if any change. There previously was a large amount of stool in the right-sided colon, which is not seen today. SOFT TISSUES: No opaque urinary calculi. BONES: Fusion hardware is again noted in the lumbar spine. IMPRESSION: 1. Mild dilatation of lower abdominal small bowel segments up to 2.9 cm, unchanged from prior within measurement variability. Electronically signed by: Francis Quam MD 10/22/2024 05:54 AM EST RP Workstation: [id number]   DG Abd Portable 1V Result Date: 10/21/2024 CLINICAL DATA:  Small bowel obstruction. EXAM: PORTABLE ABDOMEN - 1 VIEW COMPARISON:  10/18/2024 FINDINGS: Diffuse gaseous distention of small bowel noted, decreased when comparing to scout film from CT scan 1 day prior. Stool is visible in the right colon. Contrast material in the bladder is compatible with yesterday's CT scan. IMPRESSION: Diffuse gaseous distention of small bowel, decreased when comparing to scout film from CT scan 1 day prior. Electronically Signed   By: Camellia Candle M.D.   On: 10/21/2024 09:59   CT ABDOMEN PELVIS W CONTRAST Result Date: 10/20/2024 EXAM: CT ABDOMEN AND PELVIS WITH CONTRAST 10/20/2024 03:17:45 PM TECHNIQUE: CT of the abdomen  and pelvis was performed with the administration of 80 mL of iohexol (OMNIPAQUE) 300 MG/ML solution. Multiplanar reformatted images are provided for review. Automated exposure control, iterative reconstruction, and/or weight-based adjustment of the mA/kV was utilized to reduce the radiation dose to as low as reasonably achievable. COMPARISON: 12/12/2019 CLINICAL HISTORY: Bowel obstruction suspected. FINDINGS: LOWER CHEST: Moderate-sized hiatal hernia. LIVER: The liver is unremarkable. GALLBLADDER AND BILE DUCTS: Gallbladder is unremarkable. No biliary ductal dilatation. SPLEEN: No acute abnormality. PANCREAS: No acute abnormality. ADRENAL GLANDS: No acute abnormality. KIDNEYS, URETERS AND BLADDER: Right renal cyst is noted. No stones in the kidneys or ureters. No hydronephrosis. No perinephric or periureteral stranding. Urinary bladder is unremarkable. GI AND BOWEL: Stomach demonstrates no acute abnormality. Large periumbilical hernia is noted which contains a portion of transverse colon but  does not result in obstruction. Sigmoid diverticulosis without inflammation. Moderate diffuse small bowel dilatation is noted with some fecalization present concerning for distal obstruction. This appears to extend to the terminal ileum which appears to be severely thickened and extends into the ileocecal valve. This may represent inflammation although mass cannot be excluded. PERITONEUM AND RETROPERITONEUM: No ascites. No free air. VASCULATURE: Aorta is normal in caliber. Aortic atherosclerosis. LYMPH NODES: No lymphadenopathy. REPRODUCTIVE ORGANS: No acute abnormality. BONES AND SOFT TISSUES: No acute osseous abnormality. No focal soft tissue abnormality. IMPRESSION: 1. Moderate diffuse small bowel dilatation with fecalization, concerning for distal obstruction, likely at the terminal ileum; severe terminal ileal wall thickening with extension into the ileocecal valve, with differential including inflammatory etiology versus  mass. 2. Large periumbilical hernia containing a portion of the transverse colon without obstruction. 3. Sigmoid diverticulosis without inflammation. Electronically signed by: Lynwood Seip MD 10/20/2024 03:46 PM EST RP Workstation: [id number]    Assessment and Plan:   Atrial fibrillation: This was paroxysmal.  There was certainly induced by the acute illness.  At this point I do not think long-term anticoagulation is indicated.  We can discontinue the Cardizem.  I do not think further imaging is indicated.  I would arrange monitor at home to see if she is having any asymptomatic paroxysms.  Coronary artery disease: Patient had no acute ischemic changes.  She is not having any chest discomfort.  I am not expecting any acute coronary syndrome.  I think she could continue her Plavix at discharge.  I might suggest Plavix alone.  I see that she was up on both Plavix and aspirin.  I would continue ranolazine .  Risk reduction with Crestor.  Zetia.  Hypertension: Blood pressures have been elevated.  She should resume her amlodipine  as I discontinue the diltiazem.  She can resume her torsemide  when she is taking more p.o.'s.  Small bowel obstruction: Per the primary team.  She is being managed with NG tube and surgery has been consulted.  They are hoping for success with conservative management.  DNR: Limited  For questions or updates, please contact [facility] Please consult [email or web address] for contact info under Cardiology/STEMI.   Signed, Lynwood Schilling, MD  10/23/2024 10:56 AM

## 2024-10-23 NOTE — Progress Notes (Signed)
 MD Regalado paged regarding patient's change in condition. Patient lying in bed, audible wheezing. RR 32, HR 150-160s AF, BP 152/92. O2 Sats 99% 3L/[place] (patient wears 2L/[place] at baseline). Patient only c/o pain to left hip and feeling tired. Denies chest pain or SOB. Alert and oriented to self, place, time (believed year was 2024 but quickly corrected to 2025. Knew month was November). 40mg  IV Lasix  given at 0802- purewick in place. Cardizem continues to infuse at 5mg /hr.   9246- Patient converted to NSR, MD Regalado and RN at bedside. EKG done to confirm. HR 80-100s. Patient repositioned, states some improvement to left hip pain. RR now 26-28, wheezing slightly improved. Will continue to monitor closely.

## 2024-10-23 NOTE — Progress Notes (Signed)
 PROGRESS NOTE    Jennifer [name]  FMW:[id number] DOB: 1934-05-13 DOA: 10/20/2024 PCP: Cloria Annabella CROME, DO   Brief Narrative: 89 year old with past medical history significant for peripheral vascular disease, obstructive sleep apnea, on CPAP, depression, GERD, CAD with stable angina, history of PCI to LAD 2009, repeat cath 2014 due to accelerated angina and stent was patent.  Hypertension pulmonary hypertension, chronic hypoxic respiratory failure on 2 L of oxygen presented with abdominal pain, nausea.  CT with finding concerning for SBO.    Assessment & Plan:   Principal Problem:   SBO (small bowel obstruction) (HCC)  1-SBO:  - Patient presented with abdominal pain, left side, associated with nausea. - CT abdomen and pelvis showed:  Moderate diffuse small bowel dilatation with fecalization, concerning for distal obstruction, likely at the terminal ileum; severe terminal ileal wall thickening with extension into the ileocecal valve, with differential including inflammatory etiology versus mass. -Continue with IV fluids. -IV Protonix. -General Surgery consulted, plan to insert NG tube if patient starts vomiting. -KUB: Diffuse gaseous distention of small bowel, decreased when comparing to scout film from CT scan 1 day prior. Denies abdominal pain this morning, passing gas.    Acute on Chronic hypoxic respiratory failure Pulmonary hypertension - Continue with Pulmicort, Brovana, DuoNebs. Develops Dyspnea, Chest x ray with BL prominent interstitial opacities.  -Plan for BIPAP -IV lasix .  -Started IV antibiotics -Pulmonologist consulted.  -IV solumedrol.    A fib RVR; Patient develops A fib RVR Started on Cardizem Gtt Cardiology consulted.   History of CAD s/p stent LAD 2009, history of angina: -Continue with  Ranexa  -As needed nitroglycerin.    Hypertension: - Hold lisinopril in the setting of NPO. - PRN  hydralazine  -Resume Norvasc .   Hypothyroidism: -Resume  synthroid.   History of CAD status post stent LAD  On Ranexa ,  Zetia, crestor.   Depression: Resume lexapro, and Zoloft.    GERD; Start PPI     Estimated body mass index is 36.33 kg/m as calculated from the following:   Height as of this encounter: 5' 2 (1.575 m).   Weight as of this encounter: 90.1 kg.   DVT prophylaxis: Lovenox Code Status: DNR Family Communication: care discussed with patient and daughter 11/16 Disposition Plan:  Status is: Inpatient Remains inpatient appropriate because: management of SBO    Consultants:  Surgery   Procedures:  none  Antimicrobials:    Subjective: I saw patient this morning, she was having audible wheezing, dyspnea, increased work of breathing. She was feeling very weak and tired.  Denies chest pain.   I saw patient multiples time in the morning, required multiples assessment.   Objective: Vitals:   10/23/24 0704 10/23/24 0745 10/23/24 0800 10/23/24 0813  BP: 127/73 (!) 152/92 (!) 167/89 (!) 169/82  Pulse:  (!) 151 88 95  Resp:  (!) 31 (!) 30 (!) 21  Temp:      TempSrc:      SpO2:  99% 100% 99%  Weight:      Height:        Intake/Output Summary (Last 24 hours) at 10/23/2024 0846 Last data filed at 10/23/2024 9162 Gross per 24 hour  Intake 861.25 ml  Output 400 ml  Net 461.25 ml   Filed Weights   10/21/24 1433  Weight: 90.1 kg    Examination:  General exam: Acute distress, ill appearing Respiratory system: BL wheezing,  Cardiovascular system: S ,1 S 2 RRR Gastrointestinal system: BS present, soft, nt Central nervous system:  alert Extremities: no edema   Data Reviewed: I have personally reviewed following labs and imaging studies  CBC: Recent Labs  Lab 10/20/24 1353 10/20/24 1404 10/21/24 0459 10/23/24 0735  WBC 9.7  --  9.9 7.1  NEUTROABS 8.5*  --   --   --   HGB 12.9 13.3 11.6* 12.0  HCT 40.7 39.0 37.3 38.6  MCV 90.6  --  91.9 90.8  PLT 303  --  306 273   Basic Metabolic Panel: Recent  Labs  Lab 10/20/24 1353 10/20/24 1404 10/21/24 0459 10/22/24 0524 10/23/24 0735  NA 140 141 140 143 137  K 3.9 3.8 4.3 4.4 4.6  CL 102 104 103 107 101  CO2 27  --  29 28 26   GLUCOSE 133* 138* 116* 96 153*  BUN 16 18 19 15 15   CREATININE 1.11* 1.20* 1.29* 1.14* 1.29*  CALCIUM 9.7  --  9.2 8.9 9.2  MG  --   --   --  2.3 2.3   GFR: Estimated Creatinine Clearance: 31.5 mL/min (A) (by C-G formula based on SCr of 1.29 mg/dL (H)). Liver Function Tests: Recent Labs  Lab 10/20/24 1353 10/21/24 0459  AST 24 25  ALT 8 <5  ALKPHOS 126 99  BILITOT 0.5 0.6  PROT 8.4* 7.1  ALBUMIN 4.0 3.4*   Recent Labs  Lab 10/20/24 1353  LIPASE 28   No results for input(s): AMMONIA in the last 168 hours. Coagulation Profile: No results for input(s): INR, PROTIME in the last 168 hours. Cardiac Enzymes: No results for input(s): CKTOTAL, CKMB, CKMBINDEX, TROPONINI in the last 168 hours. BNP (last 3 results) No results for input(s): PROBNP in the last 8760 hours. HbA1C: No results for input(s): HGBA1C in the last 72 hours. CBG: No results for input(s): GLUCAP in the last 168 hours. Lipid Profile: No results for input(s): CHOL, HDL, LDLCALC, TRIG, CHOLHDL, LDLDIRECT in the last 72 hours. Thyroid Function Tests: Recent Labs    10/23/24 0735  TSH 2.380   Anemia Panel: No results for input(s): VITAMINB12, FOLATE, FERRITIN, TIBC, IRON, RETICCTPCT in the last 72 hours. Sepsis Labs: No results for input(s): PROCALCITON, LATICACIDVEN in the last 168 hours.  No results found for this or any previous visit (from the past 240 hours).       Radiology Studies: DG Chest Port 1 View Result Date: 10/23/2024 EXAM: 1 VIEW(S) XRAY OF THE CHEST 10/23/2024 06:50:00 AM COMPARISON: PA and lateral radiographs of the chest dated 03/19/2023. CLINICAL HISTORY: SOB (shortness of breath). FINDINGS: LUNGS AND PLEURA: Prominent interstitial opacities are again  demonstrated within the lungs bilaterally. Questionable small pleural effusions. No pneumothorax. HEART AND MEDIASTINUM: The heart is borderline in size. There is calcification within the aortic arch. There is a mild hiatus hernia. BONES AND SOFT TISSUES: Patient is status post orthopedic fixation of the lumbar spine. IMPRESSION: 1. Prominent interstitial opacities bilaterally. 2. Questionable small pleural effusions. 3. Borderline heart size. 4. Calcification within the aortic arch. 5. Mild hiatus hernia. Electronically signed by: Evalene Coho MD 10/23/2024 07:19 AM EST RP Workstation: [id number]   DG Abd Portable 1V Result Date: 10/22/2024 EXAM: 1 VIEW XRAY OF THE ABDOMEN 10/22/2024 05:40:00 AM COMPARISON: Portable abdomen flat plate exam dated 10/21/2024 09:34 AM. CLINICAL HISTORY: SBO (small bowel obstruction) (HCC). FINDINGS: BOWEL: There is still mild dilatation of the lower abdominal small bowel segments up to 2.9 cm, previously 2.7 cm with very little if any change. There previously was a large amount of stool in the  right-sided colon, which is not seen today. SOFT TISSUES: No opaque urinary calculi. BONES: Fusion hardware is again noted in the lumbar spine. IMPRESSION: 1. Mild dilatation of lower abdominal small bowel segments up to 2.9 cm, unchanged from prior within measurement variability. Electronically signed by: Francis Quam MD 10/22/2024 05:54 AM EST RP Workstation: [id number]   DG Abd Portable 1V Result Date: 10/21/2024 CLINICAL DATA:  Small bowel obstruction. EXAM: PORTABLE ABDOMEN - 1 VIEW COMPARISON:  10/18/2024 FINDINGS: Diffuse gaseous distention of small bowel noted, decreased when comparing to scout film from CT scan 1 day prior. Stool is visible in the right colon. Contrast material in the bladder is compatible with yesterday's CT scan. IMPRESSION: Diffuse gaseous distention of small bowel, decreased when comparing to scout film from CT scan 1 day prior. Electronically Signed    By: Camellia Candle M.D.   On: 10/21/2024 09:59        Scheduled Meds:  arformoterol  15 mcg Nebulization BID   budesonide (PULMICORT) nebulizer solution  0.25 mg Nebulization BID   enoxaparin (LOVENOX) injection  40 mg Subcutaneous Q24H   ezetimibe  10 mg Oral Daily   feeding supplement  1 Container Oral TID BM   furosemide   40 mg Intravenous Q12H   ipratropium-albuterol   3 mL Nebulization BID   levothyroxine  75 mcg Oral QAC breakfast   methylPREDNISolone (SOLU-MEDROL) injection  40 mg Intravenous Daily   pantoprazole (PROTONIX) IV  40 mg Intravenous Q12H   ranolazine   500 mg Oral BID   rosuvastatin  40 mg Oral Daily   sertraline  50 mg Oral Daily   Continuous Infusions:  azithromycin     cefTRIAXone (ROCEPHIN)  IV     diltiazem (CARDIZEM) infusion 5 mg/hr (10/23/24 0703)     LOS: 3 days    Time spent: 35 Minutes    Luke Rigsbee A Hridhaan Yohn, MD [facility]   If 7PM-7AM, please contact night-coverage [email or web address]  10/23/2024, 8:46 AM

## 2024-10-23 NOTE — Progress Notes (Signed)
 Progress Note     Subjective: No Abd pain.  SOB this AM. Having flatulence. Denies BM since hospitalization. Denies nausea and vomiting.    Objective: Vital signs in last 24 hours: Temp:  [98 F (36.7 C)-98.3 F (36.8 C)] 98.3 F (36.8 C) (11/16 0900) Pulse Rate:  [65-151] 95 (11/16 0813) Resp:  [21-31] 21 (11/16 0813) BP: (109-169)/(47-92) 169/82 (11/16 0813) SpO2:  [95 %-100 %] 99 % (11/16 0813) Last BM Date : 10/20/24  Intake/Output from previous day: 11/15 0701 - 11/16 0700 In: 861.3 [P.O.:240; I.V.:621.3] Out: 400 [Urine:400] Intake/Output this shift: Total I/O In: 120 [P.O.:120] Out: -   PE: General: Pleasant female who is laying in bed in NAD. Abd: Soft with min distention; no TTP. Inicisional hernia is reducible. No rebound tenderness or guarding.  Skin: Warm and dry. Psych: A&Ox3 with an appropriate affect.    Lab Results:  Recent Labs    10/21/24 0459 10/23/24 0735  WBC 9.9 7.1  HGB 11.6* 12.0  HCT 37.3 38.6  PLT 306 273   BMET Recent Labs    10/22/24 0524 10/23/24 0735  NA 143 137  K 4.4 4.6  CL 107 101  CO2 28 26  GLUCOSE 96 153*  BUN 15 15  CREATININE 1.14* 1.29*  CALCIUM 8.9 9.2   PT/INR No results for input(s): LABPROT, INR in the last 72 hours. CMP     Component Value Date/Time   NA 137 10/23/2024 0735   NA 140 09/10/2020 1001   K 4.6 10/23/2024 0735   CL 101 10/23/2024 0735   CO2 26 10/23/2024 0735   GLUCOSE 153 (H) 10/23/2024 0735   BUN 15 10/23/2024 0735   BUN 26 09/10/2020 1001   CREATININE 1.29 (H) 10/23/2024 0735   CALCIUM 9.2 10/23/2024 0735   PROT 7.1 10/21/2024 0459   PROT 6.7 09/02/2019 0903   ALBUMIN 3.4 (L) 10/21/2024 0459   ALBUMIN 3.9 09/02/2019 0903   AST 25 10/21/2024 0459   ALT <5 10/21/2024 0459   ALKPHOS 99 10/21/2024 0459   BILITOT 0.6 10/21/2024 0459   BILITOT 0.4 09/02/2019 0903   GFRNONAA 40 (L) 10/23/2024 0735   GFRAA 52 (L) 09/10/2020 1001   Lipase     Component Value Date/Time    LIPASE 28 10/20/2024 1353       Studies/Results: DG Chest Port 1 View Result Date: 10/23/2024 EXAM: 1 VIEW(S) XRAY OF THE CHEST 10/23/2024 06:50:00 AM COMPARISON: PA and lateral radiographs of the chest dated 03/19/2023. CLINICAL HISTORY: SOB (shortness of breath). FINDINGS: LUNGS AND PLEURA: Prominent interstitial opacities are again demonstrated within the lungs bilaterally. Questionable small pleural effusions. No pneumothorax. HEART AND MEDIASTINUM: The heart is borderline in size. There is calcification within the aortic arch. There is a mild hiatus hernia. BONES AND SOFT TISSUES: Patient is status post orthopedic fixation of the lumbar spine. IMPRESSION: 1. Prominent interstitial opacities bilaterally. 2. Questionable small pleural effusions. 3. Borderline heart size. 4. Calcification within the aortic arch. 5. Mild hiatus hernia. Electronically signed by: Evalene Coho MD 10/23/2024 07:19 AM EST RP Workstation: [id number]   DG Abd Portable 1V Result Date: 10/22/2024 EXAM: 1 VIEW XRAY OF THE ABDOMEN 10/22/2024 05:40:00 AM COMPARISON: Portable abdomen flat plate exam dated 10/21/2024 09:34 AM. CLINICAL HISTORY: SBO (small bowel obstruction) (HCC). FINDINGS: BOWEL: There is still mild dilatation of the lower abdominal small bowel segments up to 2.9 cm, previously 2.7 cm with very little if any change. There previously was a large amount of stool  in the right-sided colon, which is not seen today. SOFT TISSUES: No opaque urinary calculi. BONES: Fusion hardware is again noted in the lumbar spine. IMPRESSION: 1. Mild dilatation of lower abdominal small bowel segments up to 2.9 cm, unchanged from prior within measurement variability. Electronically signed by: Francis Quam MD 10/22/2024 05:54 AM EST RP Workstation: [id number]    Anti-infectives: Anti-infectives (From admission, onward)    Start     Dose/Rate Route Frequency Ordered Stop   10/23/24 1000  cefTRIAXone (ROCEPHIN) 2 g in sodium  chloride 0.9 % 100 mL IVPB        2 g 200 mL/hr over 30 Minutes Intravenous Every 24 hours 10/23/24 0804     10/23/24 1000  azithromycin (ZITHROMAX) 500 mg in sodium chloride  0.9 % 250 mL IVPB        500 mg 250 mL/hr over 60 Minutes Intravenous Every 24 hours 10/23/24 0804          Assessment/Plan Jennifer [name] is an 89 y.o. female with HTN, HLD, hypothyroidism, depressoin, PAD, CAD here with possible partial small bowel obstruction versus ileus   -CT scan with mild to moderate thickening of the terminal ileum on CT scan with mucosal enhancement. Could have some sort of terminal ileitis and/or even gastroenteritis. Large periumbilical hernia containing portion of the transverse colon without obstruction. Sigmoid diverticulosis without inflammation. -Xray with some improvement  -Will advance diet today and give dulcolax suppository as well -Hopefully, this continues to resolve with nonoperative management.  -Will continue to follow  FEN: RD; IVF per primary team VTE: Lovenox ID: None currently.    LOS: 3 days   I reviewed nursing notes, last 24 h vitals and pain scores, last 48 h intake and output, last 24 h labs and trends, and last 24 h imaging results.  This care required moderate level of medical decision making.    Bernarda JAYSON Ned, MD  [facility] 10/23/2024, 9:44 AM Please see Amion for pager number during day hours 7:00am-4:30pm

## 2024-10-23 NOTE — Progress Notes (Signed)
   10/23/24 2314  BiPAP/CPAP/SIPAP  $ Non-Invasive Home Ventilator  Initial  BiPAP/CPAP/SIPAP Pt Type Adult  BiPAP/CPAP/SIPAP DREAMSTATIOND  Mask Type Full face mask  Dentures removed? Not applicable  Mask Size Medium  Flow Rate 3 lpm  Patient Home Machine No  Patient Home Mask No  Patient Home Tubing No  Auto Titrate Yes  Minimum cmH2O 5 cmH2O  Maximum cmH2O 20 cmH2O  CPAP/SIPAP surface wiped down Yes  Device Plugged into RED Power Outlet Yes  BiPAP/CPAP /SiPAP Vitals  Pulse Rate 96  Resp (!) 32  SpO2 (!) 88 %  MEWS Score/Color  MEWS Score 2  MEWS Score Color Yellow

## 2024-10-23 NOTE — Consult Note (Signed)
 NAME:  Jennifer [name], MRN:  [id number], DOB:  09/12/1934, LOS: 3 ADMISSION DATE:  10/20/2024, CONSULTATION DATE: 10/23/24 REFERRING MD:  Dr. Owen, CHIEF COMPLAINT:  acute hypoxic respiratory failure   History of Present Illness:  Jennifer [name] is an 89 y/o F with a PMH significant for PVD, OSA on CPAP, MDD, GERD, CAD s/p PCI in 2009, HTN, HFpEF, and chronic hypoxic respiratory failure who presented to the hospital for SBO with hospital course c/b acute on chronic hypoxic respiratory failure requiring NIPPV. Pulmonology consulted for evaluation and management of the latter.   The patient initially presented with sharp abdominal pain, nausea and abdominal distention. In the ED, the patient underwent CT ABD/PLV that showed moderate diffuse small bowl dilatation with fecalization concerning for distal obstruction likely terminal ileum. Surgery was consulted and since patient had some flatulance, NGT was not placed. They followed patient and KUBs improved. They started clears yesterday.   This AM, patient developed tachypnea, worsening hypoxia, and atrial fibrillation with RVR. She was started on dilt drip, given lasix  80 mg IVP x 1, and placed on BiPAP. This has improved her clinical status. CXR done shows bilateral pulmonary vascular congestion with cardiomegaly. She was also started on CAP coverage with ceftriaxone and azithromycin as well as steroids by primary medical team.  Of note, PFTs performed on 08/2023, does not show any evidence of COPD. DLCO is reduced suggestive of pulmonary vascular disease. Echo on 3/24 shows normal LVEF 60 - 65%, normal RV size and function.   Labs from this AM, BNP 733, Cr 1.29, WBC 7.1  Pertinent  Medical History   Past Medical History:  Diagnosis Date   Ambulates with cane    straight   At risk for falls    CAD (coronary artery disease)    Cataracts, bilateral    GERD (gastroesophageal reflux disease)    Hypercholesterolemia    Hypertension     Hypothyroidism    Major depression    Musculoskeletal back pain    CHRONIC LEFT-SIDED DISCOMFORT   Obstructive sleep apnea on CPAP    uses CPAP nightly   Osteopenia    PVD (peripheral vascular disease)    Stable angina    SVD (spontaneous vaginal delivery)    x 2   Vitamin D deficiency    Wears glasses    Wears partial dentures    upper   Significant Hospital Events: Including procedures, antibiotic start and stop dates in addition to other pertinent events   As above  Interim History / Subjective:  As above  Objective    Blood pressure 128/80, pulse 78, temperature 97.6 F (36.4 C), temperature source Oral, resp. rate (!) 25, height 5' 2 (1.575 m), weight 90.1 kg, SpO2 99%.    FiO2 (%):  [28 %] 28 %   Intake/Output Summary (Last 24 hours) at 10/23/2024 1527 Last data filed at 10/23/2024 1109 Gross per 24 hour  Intake 360 ml  Output 625 ml  Net -265 ml  Overall + 1.5 L Filed Weights   10/21/24 1433  Weight: 90.1 kg    Examination: General: well appearing elderly woman, not in any acute distress HENT: anicteric sclera, well injected conjunctivae, oral and nasal mucosa intact Lungs: rhonchorous breath sounds bilaterally Cardiovascular: JVD slightly elevated, systolic murmur, 1+ pitting edema Abdomen: slightly distended, non-tender Neuro: Alert, oriented x 3, no focal deficits GU: deferred  Resolved problem list   Assessment and Plan   #Acute on Chronic Hypoxic Respiratory Failure: #Acute on Chronic  Diastolic Heart Failure: #Concern for aspiration pneumonia: #OSA on CPAP: > The patient came in for SBO and was volume resuscitated. Upon review of her history, it seems that patient never had any vomiting and so unlikely to have major fluid loss prior to admission. The patient appears hypervolemic on examination, with elevated JVD and pitting edema. Her BNP is elevated in 700s and her CXR shows cardiomegally with bilateral pulmonary infiltrates concerning for  pulmonary edema. I suspect she is in acute on chronic diastolic heart failure. - Agree with aggressive diuresis - Can use CPAP to help with work of breathing and at bedtime - Can discontinue steroids, budesonide, and arformeterol given that patient does not have any evidence of COPD on prior PFTs - Aspiration is a possibility, so a swallow test maybe helpful to elucidate that - Continue duonebs Q 6 H - Incentive spirometer - OOB to chair - Echocardiogram  Labs   CBC: Recent Labs  Lab 10/20/24 1353 10/20/24 1404 10/21/24 0459 10/23/24 0735  WBC 9.7  --  9.9 7.1  NEUTROABS 8.5*  --   --   --   HGB 12.9 13.3 11.6* 12.0  HCT 40.7 39.0 37.3 38.6  MCV 90.6  --  91.9 90.8  PLT 303  --  306 273    Basic Metabolic Panel: Recent Labs  Lab 10/20/24 1353 10/20/24 1404 10/21/24 0459 10/22/24 0524 10/23/24 0735  NA 140 141 140 143 137  K 3.9 3.8 4.3 4.4 4.6  CL 102 104 103 107 101  CO2 27  --  29 28 26   GLUCOSE 133* 138* 116* 96 153*  BUN 16 18 19 15 15   CREATININE 1.11* 1.20* 1.29* 1.14* 1.29*  CALCIUM 9.7  --  9.2 8.9 9.2  MG  --   --   --  2.3 2.3  PHOS  --   --   --   --  2.2*   GFR: Estimated Creatinine Clearance: 31.5 mL/min (A) (by C-G formula based on SCr of 1.29 mg/dL (H)). Recent Labs  Lab 10/20/24 1353 10/21/24 0459 10/23/24 0735 10/23/24 0806  PROCALCITON  --   --   --  0.33  WBC 9.7 9.9 7.1  --     Liver Function Tests: Recent Labs  Lab 10/20/24 1353 10/21/24 0459  AST 24 25  ALT 8 <5  ALKPHOS 126 99  BILITOT 0.5 0.6  PROT 8.4* 7.1  ALBUMIN 4.0 3.4*   Recent Labs  Lab 10/20/24 1353  LIPASE 28   No results for input(s): AMMONIA in the last 168 hours.  ABG    Component Value Date/Time   TCO2 30 10/20/2024 1404     Coagulation Profile: No results for input(s): INR, PROTIME in the last 168 hours.  Cardiac Enzymes: No results for input(s): CKTOTAL, CKMB, CKMBINDEX, TROPONINI in the last 168 hours.  HbA1C: No results  found for: HGBA1C  CBG: No results for input(s): GLUCAP in the last 168 hours.  Review of Systems:   Not obtained  Past Medical History:  She,  has a past medical history of Ambulates with cane, At risk for falls, CAD (coronary artery disease), Cataracts, bilateral, GERD (gastroesophageal reflux disease), Hypercholesterolemia, Hypertension, Hypothyroidism, Major depression, Musculoskeletal back pain, Obstructive sleep apnea on CPAP, Osteopenia, PVD (peripheral vascular disease), Stable angina, SVD (spontaneous vaginal delivery), Vitamin D deficiency, Wears glasses, and Wears partial dentures.   Surgical History:   Past Surgical History:  Procedure Laterality Date   ANKLE SURGERY Left    BACK  SURGERY     CARDIAC CATHETERIZATION  06/2008   PCI to LAD with 3.0 x 12 mm DES   COLONOSCOPY     EYE SURGERY     remove cataracts - bilateral   MULTIPLE TOOTH EXTRACTIONS     upper teeth - upper dentures   OPEN REDUCTION INTERNAL FIXATION (ORIF) DISTAL RADIAL FRACTURE Left 05/16/2019   Procedure: OPEN REDUCTION INTERNAL FIXATION (ORIF) DISTAL RADIAL FRACTURE;  Surgeon: Sissy Cough, MD;  Location: [facility] OR;  Service: Orthopedics;  Laterality: Left;   SPINAL FUSION     UPPER GI ENDOSCOPY       Social History:   reports that she has never smoked. She has never used smokeless tobacco. She reports that she does not drink alcohol and does not use drugs.   Family History:  Her family history includes Asthma in her father; Heart disease in her mother; Heart disease (age of onset: 15) in her brother.   Allergies No Known Allergies   Home Medications  Prior to Admission medications   Medication Sig Start Date End Date Taking? Authorizing Provider  acetaminophen  (TYLENOL ) 650 MG CR tablet Take 1,300 mg by mouth in the morning.   Yes [provider]  albuterol  (VENTOLIN  HFA) 108 (90 Base) MCG/ACT inhaler Inhale 2 puffs into the lungs every 6 (six) hours as needed for wheezing or  shortness of breath. Patient taking differently: Inhale 2 puffs into the lungs 3 (three) times daily as needed for wheezing or shortness of breath. 11/29/19  Yes Olalere, Adewale A, MD  amLODipine  (NORVASC ) 5 MG tablet Take 5 mg by mouth daily.   Yes [provider]  aspirin EC 81 MG tablet Take 81 mg by mouth in the morning. CARDIAC EVENT PREVENTION   Yes [provider]  benzonatate  (TESSALON ) 200 MG capsule Take 1 capsule (200 mg total) by mouth 3 (three) times daily as needed for cough. 09/22/22  Yes Olalere, Adewale A, MD  Cholecalciferol (VITAMIN D3) 5000 units TABS Take 5,000 Units by mouth daily with breakfast.    Yes [provider]  clopidogrel (PLAVIX) 75 MG tablet Take 75 mg by mouth daily.   Yes [provider]  ezetimibe (ZETIA) 10 MG tablet Take 10 mg by mouth daily.   Yes [provider]  Fluticasone -Umeclidin-Vilant (TRELEGY ELLIPTA ) 100-62.5-25 MCG/ACT AEPB Inhale 1 puff into the lungs daily. 03/19/23  Yes Olalere, Adewale A, MD  Hydrocortisone (PREPARATION H SOOTHING RELIEF EX) Place 1 application  rectally 4 (four) times daily as needed (for hemorrhoids).   Yes [provider]  ipratropium-albuterol  (DUONEB) 0.5-2.5 (3) MG/3ML SOLN Take 3 mLs by nebulization every 4 (four) hours as needed (for shortness of breath).   Yes [provider]  levothyroxine (SYNTHROID) 75 MCG tablet Take 75 mcg by mouth daily before breakfast.   Yes [provider]  OXYGEN Inhale 2 L/min into the lungs as needed (for shortness of breath).   Yes [provider]  pantoprazole (PROTONIX) 40 MG tablet Take 40 mg by mouth daily before breakfast.   Yes [provider]  ranolazine  (RANEXA ) 500 MG 12 hr tablet Take 500 mg by mouth in the morning and at bedtime.   Yes [provider]  rosuvastatin (CRESTOR) 40 MG tablet Take 40 mg by mouth daily.   Yes [provider]  sertraline (ZOLOFT) 50 MG tablet Take  50 mg by mouth daily.   Yes [provider]  Spacer/Aero-Holding Chambers (AEROCHAMBER MV) inhaler Use as instructed 11/29/19  Yes Olalere, Adewale A, MD  SYSTANE 0.4-0.3 % GEL ophthalmic gel Place 1 Application into both eyes 2 (two) times daily.   Yes [provider]  torsemide  (DEMADEX ) 20 MG tablet Take 1 tablet (20 mg total) by mouth 2 (two) times daily. Patient taking differently: Take 60 mg by mouth 2 (two) times daily. 09/10/20 10/20/24 Yes Lonni Slain, MD  vitamin E 45 MG (100 UNITS) capsule Take 100 Units by mouth daily.   Yes [provider]  nitroGLYCERIN (NITROSTAT) 0.4 MG SL tablet Place 0.4 mg under the tongue every 5 (five) minutes as needed for chest pain.    [provider]  traMADol (ULTRAM) 50 MG tablet Take 50 mg by mouth daily as needed for moderate pain.    [provider]     Thank you for this interesting consult. I have spent 55 minutes evaluating patient, reviewing chart, and discussing plan of care with patient, family, and primary medical team. If you have any questions or concerns please reach out to me via secure chat.  Paula Southerly, MD [facility]

## 2024-10-23 NOTE — Progress Notes (Signed)
 Pt placed on BIPAP due to WOB and SOB.

## 2024-10-23 NOTE — Progress Notes (Signed)
     Patient Name: Jennifer [name]           DOB: 1934-12-05  MRN: [id number]      Admission Date: 10/20/2024  Attending Provider: Madelyne Owen LABOR, MD  Primary Diagnosis: SBO (small bowel obstruction) (HCC)   Level of care: Telemetry   OVERNIGHT EVENT  HPI/ Events of Note Jennifer [name], 89 y.o. female, was admitted on 10/20/2024 for SBO (small bowel obstruction) (HCC).  Notified by RN of patient becoming tachycardic after being assisted from bed to chair.  This short activity triggered patient to become short of breath, neb treatment was given which further exacerbated tachycardia.   RN reporting change in cardiac rhythm, HR sustaining 160-170s for almost an hour.  BP stable.  EKG being obtained.  New onset Atrial Fibrillation with RVR HR fluctuating 140-170s at bedside.  BP stable.  Patient alert and oriented.  Denies dizziness, lightheadedness, chest pain.  Endorses palpitations, shortness of breath, nausea. BP stable, ok to start Cardizem bolus.  If no HR improvement, will proceed with Cardizem drip   Bedside Assessment:  Patient is awake, A/O, having some increased work of breathing.   Respiratory: Bilaterally wheezing, no crackles.  Tachypneic, mild accessory muscle use.   Cardiovascular: Irregularly irregular rate. Tachycardia.    Plan: EKG --> A-fib RVR Cardiac Telemetry Cardizem bolus and gtt. CXR Labs --> CMP, Mag, phosp, TSH,    Lavanda Horns, DNP, ACNPC- [facility] [place]

## 2024-10-24 ENCOUNTER — Inpatient Hospital Stay (HOSPITAL_COMMUNITY): Payer: Medicare (Managed Care)

## 2024-10-24 ENCOUNTER — Other Ambulatory Visit (HOSPITAL_COMMUNITY): Payer: Self-pay

## 2024-10-24 DIAGNOSIS — I5031 Acute diastolic (congestive) heart failure: Secondary | ICD-10-CM

## 2024-10-24 DIAGNOSIS — I5033 Acute on chronic diastolic (congestive) heart failure: Secondary | ICD-10-CM

## 2024-10-24 DIAGNOSIS — I4891 Unspecified atrial fibrillation: Secondary | ICD-10-CM | POA: Diagnosis not present

## 2024-10-24 DIAGNOSIS — I2510 Atherosclerotic heart disease of native coronary artery without angina pectoris: Secondary | ICD-10-CM | POA: Diagnosis not present

## 2024-10-24 DIAGNOSIS — I5030 Unspecified diastolic (congestive) heart failure: Secondary | ICD-10-CM

## 2024-10-24 DIAGNOSIS — I10 Essential (primary) hypertension: Secondary | ICD-10-CM | POA: Diagnosis not present

## 2024-10-24 DIAGNOSIS — K56609 Unspecified intestinal obstruction, unspecified as to partial versus complete obstruction: Secondary | ICD-10-CM | POA: Diagnosis not present

## 2024-10-24 LAB — ECHOCARDIOGRAM COMPLETE
Area-P 1/2: 3.13 cm2
Calc EF: 63.8 %
Height: 62 in
S' Lateral: 1.6 cm
Single Plane A2C EF: 70.3 %
Single Plane A4C EF: 64.4 %
Weight: 3178.15 [oz_av]

## 2024-10-24 LAB — URINALYSIS, ROUTINE W REFLEX MICROSCOPIC
Bilirubin Urine: NEGATIVE
Glucose, UA: NEGATIVE mg/dL
Ketones, ur: NEGATIVE mg/dL
Nitrite: NEGATIVE
Protein, ur: 100 mg/dL — AB
Specific Gravity, Urine: 1.015 (ref 1.005–1.030)
WBC, UA: >50 WBC/hpf (ref 0–5)
pH: 5 (ref 5.0–8.0)

## 2024-10-24 LAB — CBC
HCT: 37.5 % (ref 36.0–46.0)
Hemoglobin: 11.9 g/dL — ABNORMAL LOW (ref 12.0–15.0)
MCH: 28.5 pg (ref 26.0–34.0)
MCHC: 31.7 g/dL (ref 30.0–36.0)
MCV: 89.9 fL (ref 80.0–100.0)
Platelets: 278 K/uL (ref 150–400)
RBC: 4.17 MIL/uL (ref 3.87–5.11)
RDW: 13.8 % (ref 11.5–15.5)
WBC: 11.5 K/uL — ABNORMAL HIGH (ref 4.0–10.5)
nRBC: 0 % (ref 0.0–0.2)

## 2024-10-24 LAB — BASIC METABOLIC PANEL WITH GFR
Anion gap: 15 (ref 5–15)
BUN: 28 mg/dL — ABNORMAL HIGH (ref 8–23)
CO2: 23 mmol/L (ref 22–32)
Calcium: 8.7 mg/dL — ABNORMAL LOW (ref 8.9–10.3)
Chloride: 100 mmol/L (ref 98–111)
Creatinine, Ser: 2.16 mg/dL — ABNORMAL HIGH (ref 0.44–1.00)
GFR, Estimated: 21 mL/min — ABNORMAL LOW (ref >60)
Glucose, Bld: 140 mg/dL — ABNORMAL HIGH (ref 70–99)
Potassium: 4.1 mmol/L (ref 3.5–5.1)
Sodium: 137 mmol/L (ref 135–145)

## 2024-10-24 LAB — SODIUM, URINE, RANDOM: Sodium, Ur: <30 mmol/L

## 2024-10-24 LAB — CREATININE, URINE, RANDOM: Creatinine, Urine: 106 mg/dL

## 2024-10-24 MED ORDER — APIXABAN 2.5 MG PO TABS
2.5000 mg | ORAL_TABLET | Freq: Two times a day (BID) | ORAL | Status: DC
  Administered 2024-10-24: 2.5 mg via ORAL
  Filled 2024-10-24: qty 1

## 2024-10-24 MED ORDER — AMIODARONE LOAD VIA INFUSION
150.0000 mg | Freq: Once | INTRAVENOUS | Status: AC
  Administered 2024-10-24: 150 mg via INTRAVENOUS
  Filled 2024-10-24: qty 83.34

## 2024-10-24 MED ORDER — SODIUM CHLORIDE 0.9 % IV SOLN
250.0000 mL | INTRAVENOUS | Status: AC
  Administered 2024-10-24: 250 mL via INTRAVENOUS

## 2024-10-24 MED ORDER — ATORVASTATIN CALCIUM 40 MG PO TABS
40.0000 mg | ORAL_TABLET | Freq: Every day | ORAL | Status: DC
  Filled 2024-10-24: qty 1

## 2024-10-24 MED ORDER — ARFORMOTEROL TARTRATE 15 MCG/2ML IN NEBU
15.0000 ug | INHALATION_SOLUTION | Freq: Two times a day (BID) | RESPIRATORY_TRACT | Status: DC
  Administered 2024-10-24: 15 ug via RESPIRATORY_TRACT
  Filled 2024-10-24: qty 2

## 2024-10-24 MED ORDER — CHLORHEXIDINE GLUCONATE CLOTH 2 % EX PADS
6.0000 | MEDICATED_PAD | Freq: Every day | CUTANEOUS | Status: DC
  Administered 2024-10-24 – 2024-10-26 (×2): 6 via TOPICAL

## 2024-10-24 MED ORDER — LEVALBUTEROL HCL 0.63 MG/3ML IN NEBU
0.6300 mg | INHALATION_SOLUTION | Freq: Three times a day (TID) | RESPIRATORY_TRACT | Status: DC
  Filled 2024-10-24 (×2): qty 3

## 2024-10-24 MED ORDER — HEPARIN (PORCINE) 25000 UT/250ML-% IV SOLN
1200.0000 [IU]/h | INTRAVENOUS | Status: DC
  Administered 2024-10-25: 1000 [IU]/h via INTRAVENOUS
  Administered 2024-10-25: 1200 [IU]/h via INTRAVENOUS
  Filled 2024-10-24 (×3): qty 250

## 2024-10-24 MED ORDER — AMIODARONE HCL IN DEXTROSE 360-4.14 MG/200ML-% IV SOLN
60.0000 mg/h | INTRAVENOUS | Status: DC
  Administered 2024-10-24 (×2): 60 mg/h via INTRAVENOUS
  Filled 2024-10-24: qty 200

## 2024-10-24 MED ORDER — NOREPINEPHRINE 4 MG/250ML-% IV SOLN
0.0000 ug/min | INTRAVENOUS | Status: DC
Start: 2024-10-24 — End: 2024-10-25
  Filled 2024-10-24: qty 250

## 2024-10-24 MED ORDER — AMIODARONE HCL IN DEXTROSE 360-4.14 MG/200ML-% IV SOLN
30.0000 mg/h | INTRAVENOUS | Status: DC
  Administered 2024-10-25 – 2024-10-26 (×3): 30 mg/h via INTRAVENOUS
  Filled 2024-10-24 (×4): qty 200

## 2024-10-24 MED ORDER — FUROSEMIDE 10 MG/ML IJ SOLN
40.0000 mg | Freq: Once | INTRAMUSCULAR | Status: AC
  Administered 2024-10-24: 40 mg via INTRAVENOUS
  Filled 2024-10-24: qty 4

## 2024-10-24 MED ORDER — METOPROLOL TARTRATE 25 MG PO TABS
25.0000 mg | ORAL_TABLET | Freq: Four times a day (QID) | ORAL | Status: DC
  Administered 2024-10-24 – 2024-10-26 (×3): 25 mg via ORAL
  Filled 2024-10-24 (×3): qty 1

## 2024-10-24 MED ORDER — BISACODYL 10 MG RE SUPP
10.0000 mg | Freq: Once | RECTAL | Status: AC
  Administered 2024-10-24: 10 mg via RECTAL
  Filled 2024-10-24: qty 1

## 2024-10-24 MED ORDER — LEVALBUTEROL HCL 0.63 MG/3ML IN NEBU
0.6300 mg | INHALATION_SOLUTION | Freq: Four times a day (QID) | RESPIRATORY_TRACT | Status: DC | PRN
  Administered 2024-10-24: 0.63 mg via RESPIRATORY_TRACT
  Filled 2024-10-24 (×2): qty 3

## 2024-10-24 MED ORDER — BUDESONIDE 0.25 MG/2ML IN SUSP
0.2500 mg | Freq: Two times a day (BID) | RESPIRATORY_TRACT | Status: DC
  Administered 2024-10-24: 0.25 mg via RESPIRATORY_TRACT
  Filled 2024-10-24: qty 2

## 2024-10-24 MED ORDER — IPRATROPIUM BROMIDE 0.02 % IN SOLN
0.5000 mg | Freq: Three times a day (TID) | RESPIRATORY_TRACT | Status: DC
  Administered 2024-10-24: 0.5 mg via RESPIRATORY_TRACT
  Filled 2024-10-24: qty 2.5

## 2024-10-24 NOTE — Progress Notes (Signed)
 Progress Note     Subjective: No Abd pain.  + flatus, no BM after suppository yesterday.  No nausea.  Has no appetite so not eating much.  Unclear if she is drinking her Breeze.  In afib rvr this am with SOB, possible PNA   Objective: Vital signs in last 24 hours: Temp:  [97.6 F (36.4 C)-98.4 F (36.9 C)] 98.1 F (36.7 C) (11/17 0812) Pulse Rate:  [75-133] 133 (11/17 0812) Resp:  [16-33] 20 (11/17 0812) BP: (101-133)/(56-89) 124/89 (11/17 0812) SpO2:  [88 %-99 %] 95 % (11/17 0812) FiO2 (%):  [28 %] 28 % (11/16 0955) Last BM Date : 10/19/24  Intake/Output from previous day: 11/16 0701 - 11/17 0700 In: 1366.5 [P.O.:650; I.V.:108.5; IV Piggyback:608] Out: 575 [Urine:575] Intake/Output this shift: No intake/output data recorded.  PE: General: Pleasant female who is laying in bed in NAD. Abd: Soft with min distention; no TTP. Inicisional hernia is reducible. No rebound tenderness or guarding.  Psych: A&Ox3 with an appropriate affect.    Lab Results:  Recent Labs    10/23/24 0735 10/24/24 0505  WBC 7.1 11.5*  HGB 12.0 11.9*  HCT 38.6 37.5  PLT 273 278   BMET Recent Labs    10/23/24 0735 10/24/24 0505  NA 137 137  K 4.6 4.1  CL 101 100  CO2 26 23  GLUCOSE 153* 140*  BUN 15 28*  CREATININE 1.29* 2.16*  CALCIUM 9.2 8.7*   PT/INR No results for input(s): LABPROT, INR in the last 72 hours. CMP     Component Value Date/Time   NA 137 10/24/2024 0505   NA 140 09/10/2020 1001   K 4.1 10/24/2024 0505   CL 100 10/24/2024 0505   CO2 23 10/24/2024 0505   GLUCOSE 140 (H) 10/24/2024 0505   BUN 28 (H) 10/24/2024 0505   BUN 26 09/10/2020 1001   CREATININE 2.16 (H) 10/24/2024 0505   CALCIUM 8.7 (L) 10/24/2024 0505   PROT 7.1 10/21/2024 0459   PROT 6.7 09/02/2019 0903   ALBUMIN 3.4 (L) 10/21/2024 0459   ALBUMIN 3.9 09/02/2019 0903   AST 25 10/21/2024 0459   ALT <5 10/21/2024 0459   ALKPHOS 99 10/21/2024 0459   BILITOT 0.6 10/21/2024 0459   BILITOT 0.4  09/02/2019 0903   GFRNONAA 21 (L) 10/24/2024 0505   GFRAA 52 (L) 09/10/2020 1001   Lipase     Component Value Date/Time   LIPASE 28 10/20/2024 1353       Studies/Results: DG Chest Port 1 View Result Date: 10/24/2024 EXAM: 1 VIEW(S) XRAY OF THE CHEST 10/24/2024 06:18:25 AM COMPARISON: 10/23/2024. CLINICAL HISTORY: Dyspnea. FINDINGS: LUNGS AND PLEURA: Low lung volumes are noted. Persistent bilateral interstitial prominence is present. Trace right pleural effusion is seen. Asymmetric elevation of the left hemidiaphragm is unchanged. A left lower lobe bandlike opacity is present, which likely represents scar or subsegmental atelectasis. No pneumothorax. HEART AND MEDIASTINUM: Aortic atherosclerosis is present. A retrocardiac lucency is consistent with a hiatal hernia. No acute abnormality of the cardiac and mediastinal silhouettes. BONES AND SOFT TISSUES: Partially visualized lumbar spinal fusion hardware is noted. No acute osseous abnormality. IMPRESSION: 1. Persistent bilateral interstitial prominence with trace right pleural effusion. Electronically signed by: Waddell Calk MD 10/24/2024 06:39 AM EST RP Workstation: [id number]   DG Chest Port 1 View Result Date: 10/23/2024 EXAM: 1 VIEW(S) XRAY OF THE CHEST 10/23/2024 06:50:00 AM COMPARISON: PA and lateral radiographs of the chest dated 03/19/2023. CLINICAL HISTORY: SOB (shortness of breath). FINDINGS: LUNGS  AND PLEURA: Prominent interstitial opacities are again demonstrated within the lungs bilaterally. Questionable small pleural effusions. No pneumothorax. HEART AND MEDIASTINUM: The heart is borderline in size. There is calcification within the aortic arch. There is a mild hiatus hernia. BONES AND SOFT TISSUES: Patient is status post orthopedic fixation of the lumbar spine. IMPRESSION: 1. Prominent interstitial opacities bilaterally. 2. Questionable small pleural effusions. 3. Borderline heart size. 4. Calcification within the aortic arch. 5.  Mild hiatus hernia. Electronically signed by: Evalene Coho MD 10/23/2024 07:19 AM EST RP Workstation: [id number]    Anti-infectives: Anti-infectives (From admission, onward)    Start     Dose/Rate Route Frequency Ordered Stop   10/23/24 1000  cefTRIAXone (ROCEPHIN) 2 g in sodium chloride  0.9 % 100 mL IVPB        2 g 200 mL/hr over 30 Minutes Intravenous Every 24 hours 10/23/24 0804     10/23/24 1000  azithromycin (ZITHROMAX) 500 mg in sodium chloride  0.9 % 250 mL IVPB        500 mg 250 mL/hr over 60 Minutes Intravenous Every 24 hours 10/23/24 0804          Assessment/Plan Jeimy Proudfoot is an 89 y.o. female with HTN, HLD, hypothyroidism, depressoin, PAD, CAD here with possible partial small bowel obstruction versus ileus   -CT scan with mild to moderate thickening of the terminal ileum on CT scan with mucosal enhancement. Could have some sort of terminal ileitis and/or even gastroenteritis. Large periumbilical hernia containing portion of the transverse colon without obstruction. Sigmoid diverticulosis without inflammation. -Xray with some improvement -on regular diet with Breeze TID, not really eating due to poor appetite -retry suppository again today -Hopefully, this continues to resolve with nonoperative management.  -Will continue to follow, but no surgical needs at this time. -saw with primary team and discussed at bedside  FEN: Regular; IVF per primary team VTE: Lovenox ID: None currently.    LOS: 4 days   I reviewed nursing notes, hospitalist notes, last 24 h vitals and pain scores, last 48 h intake and output, last 24 h labs and trends, and last 24 h imaging results.  Burnard FORBES Banter, [facility] Surgery 10/24/2024, 9:19 AM Please see Amion for pager number during day hours 7:00am-4:30pm

## 2024-10-24 NOTE — Progress Notes (Signed)
 OT Cancellation Note  Patient Details Name: Jennifer [name] MRN: [id number] DOB: March 02, 1934   Cancelled Treatment:    Reason Eval/Treat Not Completed: Medical issues which prohibited therapy  Patient noted to have increased SOB/WOB and elevated HR upon OT chart review prior to session with nursing confirmation.  Will cancel OT session this day and allow for session once patient able and schedule allows.   Savon Cobbs OT/L Acute Rehabilitation Department  [phone]    10/24/2024, 12:54 PM

## 2024-10-24 NOTE — Progress Notes (Signed)
Rt took pt off BIPAP and placed on [address] . Pt doing well at this time.

## 2024-10-24 NOTE — Progress Notes (Signed)
 Pt increasingly wheezy and anxious. HR remains in the 130's despite Metoprolol and Cardizem. RT notified for treatment. MD notified. New orders received for tx to Stepdown and Amiodarone. Faris Coolman B

## 2024-10-24 NOTE — Progress Notes (Addendum)
 PROGRESS NOTE    Jennifer [name]  FMW:[id number] DOB: 26-Feb-1934 DOA: 10/20/2024 PCP: Cloria Annabella CROME, DO   Brief Narrative: 89 year old with past medical history significant for peripheral vascular disease, obstructive sleep apnea, on CPAP, depression, GERD, CAD with stable angina, history of PCI to LAD 2009, repeat cath 2014 due to accelerated angina and stent was patent.  Hypertension pulmonary hypertension, chronic hypoxic respiratory failure on 2 L of oxygen presented with abdominal pain, nausea.  CT with finding concerning for SBO.    Assessment & Plan:   Principal Problem:   SBO (small bowel obstruction) (HCC)  1-SBO:  - Patient presented with abdominal pain, left side, associated with nausea. - CT abdomen and pelvis showed:  Moderate diffuse small bowel dilatation with fecalization, concerning for distal obstruction, likely at the terminal ileum; severe terminal ileal wall thickening with extension into the ileocecal valve, with differential including inflammatory etiology versus mass. -Continue with IV fluids. -IV Protonix. -General Surgery consulted, and following.  -KUB: Diffuse gaseous distention of small bowel, decreased when comparing to scout film from CT scan 1 day prior. -no BM, denies abdominal pain, passing gas.   Acute on Chronic hypoxic respiratory failure Pulmonary hypertension PNA, Aspiration PNA - Continue with Pulmicort, Brovana, DuoNebs. -11/16: Develops Dyspnea, Chest x ray with BL prominent interstitial opacities.  -Continue with IV antibiotics -Pulmonologist consulted, recommended diuresis.  -IV solumedrol.   -hold on lasix  due to AKI.  Chest x ray today, still showing BL infiltrates.   A fib RVR; Patient develops A fib RVR Continue with  on Cardizem Gtt---increased 10cc today. HR elevated.  Cardiology consulted, appreciate assistance. Started on oral metoprolol and eliquis.  Addendum;  HR continue to be elevated, plan to start amiodarone gtt.  Appreciate cardiology   History of CAD s/p stent LAD 2009, history of angina: -Continue with  Ranexa  -As needed nitroglycerin.    Hypertension: - Hold lisinopril in the setting of NPO. - PRN  hydralazine Hold Norvasc    Hypothyroidism: -Continue with  synthroid.   History of CAD status post stent LAD  On Ranexa ,  Zetia, crestor.   Depression: Continue with  lexapro, and Zoloft.    GERD; Start PPI     Estimated body mass index is 36.33 kg/m as calculated from the following:   Height as of this encounter: 5' 2 (1.575 m).   Weight as of this encounter: 90.1 kg.   DVT prophylaxis: Lovenox Code Status: DNR Family Communication: care discussed with patient and daughter 11/17 Disposition Plan:  Status is: Inpatient Remains inpatient appropriate because: management of SBO    Consultants:  Surgery   Procedures:  none  Antimicrobials:    Subjective: I saw patient this morning, she was alert, report some improvement of dyspnea.  She was on 3 L oxygen. HR elevated this am 140.   Objective: Vitals:   10/24/24 0221 10/24/24 0400 10/24/24 0500 10/24/24 0700  BP:  114/69 120/64 116/74  Pulse:  96 92   Resp:  18 19   Temp:  98.4 F (36.9 C)    TempSrc:  Oral    SpO2: 92% 90% 92%   Weight:      Height:        Intake/Output Summary (Last 24 hours) at 10/24/2024 0736 Last data filed at 10/24/2024 0500 Gross per 24 hour  Intake 1366.47 ml  Output 575 ml  Net 791.47 ml   Filed Weights   10/21/24 1433  Weight: 90.1 kg    Examination:  General exam:  alert, comfortable.  Respiratory system: Less BL wheezing.  Cardiovascular system: S 1, S 2 RRR Gastrointestinal system: BS present, soft, nt Central nervous system: Alert, follows command Extremities: no edema   Data Reviewed: I have personally reviewed following labs and imaging studies  CBC: Recent Labs  Lab 10/20/24 1353 10/20/24 1404 10/21/24 0459 10/23/24 0735 10/24/24 0505  WBC 9.7  --  9.9  7.1 11.5*  NEUTROABS 8.5*  --   --   --   --   HGB 12.9 13.3 11.6* 12.0 11.9*  HCT 40.7 39.0 37.3 38.6 37.5  MCV 90.6  --  91.9 90.8 89.9  PLT 303  --  306 273 278   Basic Metabolic Panel: Recent Labs  Lab 10/20/24 1353 10/20/24 1404 10/21/24 0459 10/22/24 0524 10/23/24 0735 10/24/24 0505  NA 140 141 140 143 137 137  K 3.9 3.8 4.3 4.4 4.6 4.1  CL 102 104 103 107 101 100  CO2 27  --  29 28 26 23   GLUCOSE 133* 138* 116* 96 153* 140*  BUN 16 18 19 15 15  28*  CREATININE 1.11* 1.20* 1.29* 1.14* 1.29* 2.16*  CALCIUM 9.7  --  9.2 8.9 9.2 8.7*  MG  --   --   --  2.3 2.3  --   PHOS  --   --   --   --  2.2*  --    GFR: Estimated Creatinine Clearance: 18.8 mL/min (A) (by C-G formula based on SCr of 2.16 mg/dL (H)). Liver Function Tests: Recent Labs  Lab 10/20/24 1353 10/21/24 0459  AST 24 25  ALT 8 <5  ALKPHOS 126 99  BILITOT 0.5 0.6  PROT 8.4* 7.1  ALBUMIN 4.0 3.4*   Recent Labs  Lab 10/20/24 1353  LIPASE 28   No results for input(s): AMMONIA in the last 168 hours. Coagulation Profile: No results for input(s): INR, PROTIME in the last 168 hours. Cardiac Enzymes: No results for input(s): CKTOTAL, CKMB, CKMBINDEX, TROPONINI in the last 168 hours. BNP (last 3 results) Recent Labs    10/23/24 0806  PROBNP 733.0*   HbA1C: No results for input(s): HGBA1C in the last 72 hours. CBG: No results for input(s): GLUCAP in the last 168 hours. Lipid Profile: No results for input(s): CHOL, HDL, LDLCALC, TRIG, CHOLHDL, LDLDIRECT in the last 72 hours. Thyroid Function Tests: Recent Labs    10/23/24 0735  TSH 2.380   Anemia Panel: No results for input(s): VITAMINB12, FOLATE, FERRITIN, TIBC, IRON, RETICCTPCT in the last 72 hours. Sepsis Labs: Recent Labs  Lab 10/23/24 0806  PROCALCITON 0.33    No results found for this or any previous visit (from the past 240 hours).       Radiology Studies: DG Chest Port 1 View Result  Date: 10/24/2024 EXAM: 1 VIEW(S) XRAY OF THE CHEST 10/24/2024 06:18:25 AM COMPARISON: 10/23/2024. CLINICAL HISTORY: Dyspnea. FINDINGS: LUNGS AND PLEURA: Low lung volumes are noted. Persistent bilateral interstitial prominence is present. Trace right pleural effusion is seen. Asymmetric elevation of the left hemidiaphragm is unchanged. A left lower lobe bandlike opacity is present, which likely represents scar or subsegmental atelectasis. No pneumothorax. HEART AND MEDIASTINUM: Aortic atherosclerosis is present. A retrocardiac lucency is consistent with a hiatal hernia. No acute abnormality of the cardiac and mediastinal silhouettes. BONES AND SOFT TISSUES: Partially visualized lumbar spinal fusion hardware is noted. No acute osseous abnormality. IMPRESSION: 1. Persistent bilateral interstitial prominence with trace right pleural effusion. Electronically signed by: Waddell Calk MD 10/24/2024 06:39 AM EST  RP Workstation: [email or web address]   DG Chest Port 1 View Result Date: 10/23/2024 EXAM: 1 VIEW(S) XRAY OF THE CHEST 10/23/2024 06:50:00 AM COMPARISON: PA and lateral radiographs of the chest dated 03/19/2023. CLINICAL HISTORY: SOB (shortness of breath). FINDINGS: LUNGS AND PLEURA: Prominent interstitial opacities are again demonstrated within the lungs bilaterally. Questionable small pleural effusions. No pneumothorax. HEART AND MEDIASTINUM: The heart is borderline in size. There is calcification within the aortic arch. There is a mild hiatus hernia. BONES AND SOFT TISSUES: Patient is status post orthopedic fixation of the lumbar spine. IMPRESSION: 1. Prominent interstitial opacities bilaterally. 2. Questionable small pleural effusions. 3. Borderline heart size. 4. Calcification within the aortic arch. 5. Mild hiatus hernia. Electronically signed by: Evalene Coho MD 10/23/2024 07:19 AM EST RP Workstation: [id number]        Scheduled Meds:  enoxaparin (LOVENOX) injection  40 mg Subcutaneous Q24H   ezetimibe   10 mg Oral Daily   feeding supplement  1 Container Oral TID BM   ipratropium-albuterol   3 mL Nebulization Q6H   levothyroxine  75 mcg Oral QAC breakfast   methylPREDNISolone (SOLU-MEDROL) injection  40 mg Intravenous Daily   pantoprazole (PROTONIX) IV  40 mg Intravenous Q12H   ranolazine   500 mg Oral BID   rosuvastatin  40 mg Oral Daily   sertraline  50 mg Oral Daily   Continuous Infusions:  azithromycin 500 mg (10/23/24 1106)   cefTRIAXone (ROCEPHIN)  IV 2 g (10/23/24 1005)   diltiazem (CARDIZEM) infusion 7.5 mg/hr (10/24/24 0646)     LOS: 4 days    Time spent: 35 Minutes    Damien Cisar A Avram Danielson, MD [facility]   If 7PM-7AM, please contact night-coverage [email or web address]  10/24/2024, 7:36 AM

## 2024-10-24 NOTE — Progress Notes (Signed)
   10/24/24 0812  Assess: MEWS Score  Temp 98.1 F (36.7 C)  BP 124/89  Pulse Rate (!) 133  Resp 20  Level of Consciousness Alert  SpO2 95 %  O2 Device Nasal Cannula  O2 Flow Rate (L/min) 3 L/min  Assess: MEWS Score  MEWS Temp 0  MEWS Systolic 0  MEWS Pulse 3  MEWS RR 0  MEWS LOC 0  MEWS Score 3  MEWS Score Color Yellow  Assess: if the MEWS score is Yellow or Red  Were vital signs accurate and taken at a resting state? Yes  Does the patient meet 2 or more of the SIRS criteria? No  MEWS guidelines implemented  Yes, yellow  Treat  MEWS Interventions Considered administering scheduled or prn medications/treatments as ordered  Take Vital Signs  Increase Vital Sign Frequency  Yellow: Q2hr x1, continue Q4hrs until patient remains green for 12hrs  Escalate  MEWS: Escalate Yellow: Discuss with charge nurse and consider notifying provider and/or RRT  Notify: Charge Nurse/RN  Name of Charge Nurse/RN Notified Gustavo Dimes, RN  Provider Notification  Provider Name/Title Dr. Madelyne  Date Provider Notified 10/22/24  Time Provider Notified 0815  Method of Notification Face-to-face  Notification Reason Other (Comment) (elev HR and decreased UO)  Provider response At bedside  Date of Provider Response 10/24/24  Time of Provider Response 0815  Assess: SIRS CRITERIA  SIRS Temperature  0  SIRS Respirations  0  SIRS Pulse 1  SIRS WBC 0  SIRS Score Sum  1

## 2024-10-24 NOTE — Progress Notes (Signed)
  Progress Note  Patient Name: Ramiah Helfrich Date of Encounter: 10/24/2024 [facility] Cardiologist: Shelda Bruckner, MD   Interval Summary   Yesterday, she went into afib with RVR when tried to get out of bed. This morning, she has been back in Afib with HR 120s-130s and her dilt gtt was increased. She reports feeling better but still having some dyspnea.   Vital Signs Vitals:   10/24/24 0500 10/24/24 0700 10/24/24 0812 10/24/24 1046  BP: 120/64 116/74 124/89 116/62  Pulse: 92  (!) 133 (!) 136  Resp: 19  20 (!) 22  Temp:   98.1 F (36.7 C) 98.1 F (36.7 C)  TempSrc:   Oral Oral  SpO2: 92%  95% 95%  Weight:      Height:        Intake/Output Summary (Last 24 hours) at 10/24/2024 1055 Last data filed at 10/24/2024 0500 Gross per 24 hour  Intake 1246.47 ml  Output 575 ml  Net 671.47 ml      10/21/2024    2:33 PM 08/01/2024    1:11 PM 07/14/2024   11:23 AM  Last 3 Weights  Weight (lbs) 198 lb 10.2 oz 189 lb 189 lb  Weight (kg) 90.1 kg 85.73 kg 85.73 kg      Telemetry/ECG  Afib with RVR 120s-130s - Personally Reviewed  Physical Exam  GEN: No acute distress.   Neck: No JVD Cardiac: irregularly irregular and tachycardic, no murmurs, rubs, or gallops.  Respiratory: Clear to auscultation bilaterally. GI: Soft, nontender, non-distended  MS: No edema  Assessment & Plan  Ms Aldredge is a 17 yoF with Hx of CAD s/p remote LAD PCI, HFpEF, PAD, chronic respiratory failure on 2L who presented with partial SBO vs. Ileus and was found to be in Afib briefly last week and started on dilt gtt. She was in NSR with high PAC burden yesterday and went to Afib with RVR this AM (11/17) requiring higher doses of Dilt.  #Afib with RVR #AKI #CAD/PAD #HFpEF - In Afib with RVR this AM requiring higher dose of Dilt now at 10. Start metop 25 mg Q6H and Eliquis 5 mg BID. Will uptitrate metop to wean down the dilt gtt. - AKI today likely in the setting overdiuresis vs. Contrast  induced; will hold diuresis for now - Follow up with TTE - Cont rosuvastatin, ezetimibe, and ranolazine ; cont holding amlodipine  for now - We will continue to follow  For questions or updates, please contact [facility] Please consult [email or web address] for contact info under   Signed, Joelle VEAR Ren Donley, MD

## 2024-10-24 NOTE — Plan of Care (Signed)
 HR improved with Cardizem and amiodarone drips, able to tolerate 6L [place] for moderate periods before needing to wear the BiPAP again.  Foley catheter inserted and IV furosemide  administered. Will be NPO after midnight.

## 2024-10-24 NOTE — Progress Notes (Signed)
 Pt back on BIPAP due to increase HR and WOB. PT being transferred to ICU once bed becomes available.

## 2024-10-24 NOTE — Discharge Instructions (Signed)

## 2024-10-24 NOTE — Progress Notes (Signed)
  Echocardiogram 2D Echocardiogram has been performed.  Jennifer [name] 10/24/2024, 9:16 AM

## 2024-10-24 NOTE — Progress Notes (Signed)
 NAME:  Jennifer [name], MRN:  [id number], DOB:  [birth year], LOS: 4 ADMISSION DATE:  10/20/2024, CONSULTATION DATE: 10/24/24 REFERRING MD:  Dr. Owen, CHIEF COMPLAINT:  acute hypoxic respiratory failure   History of Present Illness:  Jennifer [name] is an 89 y/o F with a PMH significant for PVD, OSA on CPAP, MDD, GERD, CAD s/p PCI in 2009, HTN, HFpEF, and chronic hypoxic respiratory failure who presented to the hospital for SBO with hospital course c/b acute on chronic hypoxic respiratory failure requiring NIPPV. Pulmonology consulted for evaluation and management of the latter.   The patient initially presented with sharp abdominal pain, nausea and abdominal distention. In the ED, the patient underwent CT ABD/PLV that showed moderate diffuse small bowl dilatation with fecalization concerning for distal obstruction likely terminal ileum. Surgery was consulted and since patient had some flatulance, NGT was not placed. They followed patient and KUBs improved. They started clears yesterday.   This AM, patient developed tachypnea, worsening hypoxia, and atrial fibrillation with RVR. She was started on dilt drip, given lasix  80 mg IVP x 1, and placed on BiPAP. This has improved her clinical status. CXR done shows bilateral pulmonary vascular congestion with cardiomegaly. She was also started on CAP coverage with ceftriaxone and azithromycin as well as steroids by primary medical team.  Of note, PFTs performed on 08/2023, does not show any evidence of COPD. DLCO is reduced suggestive of pulmonary vascular disease. Echo on 3/24 shows normal LVEF 60 - 65%, normal RV size and function.   Labs from this AM, BNP 733, Cr 1.29, WBC 7.1  Pertinent  Medical History   Past Medical History:  Diagnosis Date   Ambulates with cane    straight   At risk for falls    CAD (coronary artery disease)    Cataracts, bilateral    GERD (gastroesophageal reflux disease)    Hypercholesterolemia    Hypertension     Hypothyroidism    Major depression    Musculoskeletal back pain    CHRONIC LEFT-SIDED DISCOMFORT   Obstructive sleep apnea on CPAP    uses CPAP nightly   Osteopenia    PVD (peripheral vascular disease)    Stable angina    SVD (spontaneous vaginal delivery)    x 2   Vitamin D deficiency    Wears glasses    Wears partial dentures    upper   Significant Hospital Events: Including procedures, antibiotic start and stop dates in addition to other pertinent events   11/13: ED for abdominal pain >SBO admitted to hospitalist  11/15: starting clear liquids  11/16: HR increase to 160-170s, SOB when transfer from bed to chair > cardizem bolus and gtt, EKG with RVR; cardiology consult. CCM consult  11/17: reconsult CCM, moving to SDU for amio gtt, on BIPAP  Interim History / Subjective:  Seen on arrival to SDU. On bipap she states her breathing feels improved on BiPAP.   Objective    Blood pressure 106/74, pulse (!) 126, temperature (!) 97.3 F (36.3 C), temperature source Oral, resp. rate (!) 22, height 5' 2 (1.575 m), weight 90.1 kg, SpO2 93%.    FiO2 (%):  [28 %] 28 %   Intake/Output Summary (Last 24 hours) at 10/24/2024 1511 Last data filed at 10/24/2024 0500 Gross per 24 hour  Intake 1006.47 ml  Output 350 ml  Net 656.47 ml  Overall + 1.5 L Filed Weights   10/21/24 1433  Weight: 90.1 kg    Examination: General: older female, laying  in bed, no acute distress  HENT: anicteric sclera, mucous membranes dry, bipap Lungs: expiratory wheezing, bipap  Cardiovascular: irregularly irregular rhythm, do not appreciate murmur Abdomen: rounded, soft Neuro: awake, oriented, follows commands GU: no foley   Resolved problem list   Assessment and Plan   #Acute on Chronic Hypoxic Respiratory Failure: #Acute on Chronic Diastolic Heart Failure: #Concern for aspiration pneumonia: #OSA on CPAP #SBO   - cardiology following, appreciate help in management  - add amiodarone gtt with  cardizem gtt - NPO at midnight for possible TEE DCCV in AM  - if decompensates overnight from respiratory stand point, cardiology recommending IV diuresis  - BiPAP now and then at bedtime when improving  - d/c'd steroids, arformeterol and and budesonide yesterday by ccm attending with no PFT evidence of COPD  - will switch eliquis to heparin for possible procedures upcoming - CAP coverage with rocephin, azithromycin but have suspicion this is all HF driven  - recommend formal SLP to r/o aspiration component  - xopenex preferred with RVR - ordered  - IS when not on bipap   Pulm will follow along. TRH to medically manage in SDU.   Labs   CBC: Recent Labs  Lab 10/20/24 1353 10/20/24 1404 10/21/24 0459 10/23/24 0735 10/24/24 0505  WBC 9.7  --  9.9 7.1 11.5*  NEUTROABS 8.5*  --   --   --   --   HGB 12.9 13.3 11.6* 12.0 11.9*  HCT 40.7 39.0 37.3 38.6 37.5  MCV 90.6  --  91.9 90.8 89.9  PLT 303  --  306 273 278    Basic Metabolic Panel: Recent Labs  Lab 10/20/24 1353 10/20/24 1404 10/21/24 0459 10/22/24 0524 10/23/24 0735 10/24/24 0505  NA 140 141 140 143 137 137  K 3.9 3.8 4.3 4.4 4.6 4.1  CL 102 104 103 107 101 100  CO2 27  --  29 28 26 23   GLUCOSE 133* 138* 116* 96 153* 140*  BUN 16 18 19 15 15  28*  CREATININE 1.11* 1.20* 1.29* 1.14* 1.29* 2.16*  CALCIUM 9.7  --  9.2 8.9 9.2 8.7*  MG  --   --   --  2.3 2.3  --   PHOS  --   --   --   --  2.2*  --    GFR: Estimated Creatinine Clearance: 18.8 mL/min (A) (by C-G formula based on SCr of 2.16 mg/dL (H)). Recent Labs  Lab 10/20/24 1353 10/21/24 0459 10/23/24 0735 10/23/24 0806 10/24/24 0505  PROCALCITON  --   --   --  0.33  --   WBC 9.7 9.9 7.1  --  11.5*    Liver Function Tests: Recent Labs  Lab 10/20/24 1353 10/21/24 0459  AST 24 25  ALT 8 <5  ALKPHOS 126 99  BILITOT 0.5 0.6  PROT 8.4* 7.1  ALBUMIN 4.0 3.4*   Recent Labs  Lab 10/20/24 1353  LIPASE 28   No results for input(s): AMMONIA in the  last 168 hours.  ABG    Component Value Date/Time   TCO2 30 10/20/2024 1404     Coagulation Profile: No results for input(s): INR, PROTIME in the last 168 hours.  Cardiac Enzymes: No results for input(s): CKTOTAL, CKMB, CKMBINDEX, TROPONINI in the last 168 hours.  HbA1C: No results found for: HGBA1C  CBG: No results for input(s): GLUCAP in the last 168 hours.  Review of Systems:   As above  Past Medical History:  She,  has  a past medical history of Ambulates with cane, At risk for falls, CAD (coronary artery disease), Cataracts, bilateral, GERD (gastroesophageal reflux disease), Hypercholesterolemia, Hypertension, Hypothyroidism, Major depression, Musculoskeletal back pain, Obstructive sleep apnea on CPAP, Osteopenia, PVD (peripheral vascular disease), Stable angina, SVD (spontaneous vaginal delivery), Vitamin D deficiency, Wears glasses, and Wears partial dentures.   Surgical History:   Past Surgical History:  Procedure Laterality Date   ANKLE SURGERY Left    BACK SURGERY     CARDIAC CATHETERIZATION  06/2008   PCI to LAD with 3.0 x 12 mm DES   COLONOSCOPY     EYE SURGERY     remove cataracts - bilateral   MULTIPLE TOOTH EXTRACTIONS     upper teeth - upper dentures   OPEN REDUCTION INTERNAL FIXATION (ORIF) DISTAL RADIAL FRACTURE Left 05/16/2019   Procedure: OPEN REDUCTION INTERNAL FIXATION (ORIF) DISTAL RADIAL FRACTURE;  Surgeon: Sissy Cough, MD;  Location: [facility] OR;  Service: Orthopedics;  Laterality: Left;   SPINAL FUSION     UPPER GI ENDOSCOPY       Social History:   reports that she has never smoked. She has never used smokeless tobacco. She reports that she does not drink alcohol and does not use drugs.   Family History:  Her family history includes Asthma in her father; Heart disease in her mother; Heart disease (age of onset: 56) in her brother.   Allergies No Known Allergies   Home Medications  Prior to Admission medications    Medication Sig Start Date End Date Taking? Authorizing Provider  acetaminophen  (TYLENOL ) 650 MG CR tablet Take 1,300 mg by mouth in the morning.   Yes [provider]  albuterol  (VENTOLIN  HFA) 108 (90 Base) MCG/ACT inhaler Inhale 2 puffs into the lungs every 6 (six) hours as needed for wheezing or shortness of breath. Patient taking differently: Inhale 2 puffs into the lungs 3 (three) times daily as needed for wheezing or shortness of breath. 11/29/19  Yes Olalere, Adewale A, MD  amLODipine  (NORVASC ) 5 MG tablet Take 5 mg by mouth daily.   Yes [provider]  aspirin EC 81 MG tablet Take 81 mg by mouth in the morning. CARDIAC EVENT PREVENTION   Yes [provider]  benzonatate  (TESSALON ) 200 MG capsule Take 1 capsule (200 mg total) by mouth 3 (three) times daily as needed for cough. 09/22/22  Yes Olalere, Adewale A, MD  Cholecalciferol (VITAMIN D3) 5000 units TABS Take 5,000 Units by mouth daily with breakfast.    Yes [provider]  clopidogrel (PLAVIX) 75 MG tablet Take 75 mg by mouth daily.   Yes [provider]  ezetimibe (ZETIA) 10 MG tablet Take 10 mg by mouth daily.   Yes [provider]  Fluticasone -Umeclidin-Vilant (TRELEGY ELLIPTA ) 100-62.5-25 MCG/ACT AEPB Inhale 1 puff into the lungs daily. 03/19/23  Yes Olalere, Adewale A, MD  Hydrocortisone (PREPARATION H SOOTHING RELIEF EX) Place 1 application  rectally 4 (four) times daily as needed (for hemorrhoids).   Yes [provider]  ipratropium-albuterol  (DUONEB) 0.5-2.5 (3) MG/3ML SOLN Take 3 mLs by nebulization every 4 (four) hours as needed (for shortness of breath).   Yes [provider]  levothyroxine (SYNTHROID) 75 MCG tablet Take 75 mcg by mouth daily before breakfast.   Yes [provider]  OXYGEN Inhale 2 L/min into the lungs as needed (for shortness of breath).   Yes [provider]  pantoprazole (PROTONIX) 40 MG tablet Take 40 mg by mouth  daily before breakfast.  Yes [provider]  ranolazine  (RANEXA ) 500 MG 12 hr tablet Take 500 mg by mouth in the morning and at bedtime.   Yes [provider]  rosuvastatin (CRESTOR) 40 MG tablet Take 40 mg by mouth daily.   Yes [provider]  sertraline (ZOLOFT) 50 MG tablet Take 50 mg by mouth daily.   Yes [provider]  Spacer/Aero-Holding Chambers (AEROCHAMBER MV) inhaler Use as instructed 11/29/19  Yes Olalere, Adewale A, MD  SYSTANE 0.4-0.3 % GEL ophthalmic gel Place 1 Application into both eyes 2 (two) times daily.   Yes [provider]  torsemide  (DEMADEX ) 20 MG tablet Take 1 tablet (20 mg total) by mouth 2 (two) times daily. Patient taking differently: Take 60 mg by mouth 2 (two) times daily. 09/10/20 10/20/24 Yes Lonni Slain, MD  vitamin E 45 MG (100 UNITS) capsule Take 100 Units by mouth daily.   Yes [provider]  nitroGLYCERIN (NITROSTAT) 0.4 MG SL tablet Place 0.4 mg under the tongue every 5 (five) minutes as needed for chest pain.    [provider]  traMADol (ULTRAM) 50 MG tablet Take 50 mg by mouth daily as needed for moderate pain.    [provider]    CC time: 31  The patient is critically ill with multiple organ system failure and requires high complexity decision making for assessment and support, frequent evaluation and titration of therapies, advanced monitoring, review of radiographic studies and interpretation of complex data.    Critical Care Time devoted to patient care services, exclusive of separately billable procedures, described in this note is 35   Tinnie FORBES Adolph DEVONNA [facility] 10/24/24 3:20 PM  Please see [email or web address] for pager details.  From 7A-7P if no response, please call [phone] After hours, please call ELink [phone]

## 2024-10-24 NOTE — Consult Note (Addendum)
 Renal Service Consult Note [facility] Jennifer JONETTA Fret, MD  Patient: Jennifer [name] Date: 10/24/2024 Requesting Physician: Dr. Madelyne  Reason for Consult: Renal failure HPI: The patient is a 89 y.o. year-old w/ PMH of PAD, OSA, depression, GERD, CAD with stable angina, h/o PCI to LAD w/ repeat cath 2014 due to accelerated angina. Also HL, HTN, pHTN w/ chronic hypoxic respiratory failure, esophageal stricture. Pt presented complaining of abdominal pain, nausea, reports abdominal distention. In ED BP 160/82, HR 77, RR 17, temp 97, 2L O2 (chronic at home). 100% sats. Na 130, bun 16, creat 1.1, LFT's ok, Hb 12.9, WBC 9K. Umbilical hernia was reduced by ED. CT showed SBO in terminal ileum. Gen surgery saw pt and did not place NG tube because was not vomiting.  Pt was admitted to medical team for SBO. IVF's were started and IV PPI. Home lisinopril was held. On 11/16 pt developed SOB and CXR showed some IS changes. Bipap was placed and IV lasix  and IV abx started. Also IV solumedrol. Pt had afib /RVR as well. Seen by Pulm who noted the lasix , IV dilt and bipap had improved her resp status. Echo 3/24 showed normal EF 60-65% and normal RV size/ fxn. Creatinine was 1.2 on admission, stable yest at 1.29, and today is up to 2.16. We are asked to see for renal failure.    Pt poor historian. Does say she is on O2 24 hrs at home, walks a little bit at home w/ O2. Doesn't go out.  L/w daughter.     Past Medical History  Past Medical History:  Diagnosis Date   Ambulates with cane    straight   At risk for falls    CAD (coronary artery disease)    Cataracts, bilateral    GERD (gastroesophageal reflux disease)    Hypercholesterolemia    Hypertension    Hypothyroidism    Major depression    Musculoskeletal back pain    CHRONIC LEFT-SIDED DISCOMFORT   Obstructive sleep apnea on CPAP    uses CPAP nightly   Osteopenia    PVD (peripheral vascular disease)    Stable angina    SVD  (spontaneous vaginal delivery)    x 2   Vitamin D deficiency    Wears glasses    Wears partial dentures    upper   Past Surgical History  Past Surgical History:  Procedure Laterality Date   ANKLE SURGERY Left    BACK SURGERY     CARDIAC CATHETERIZATION  06/2008   PCI to LAD with 3.0 x 12 mm DES   COLONOSCOPY     EYE SURGERY     remove cataracts - bilateral   MULTIPLE TOOTH EXTRACTIONS     upper teeth - upper dentures   OPEN REDUCTION INTERNAL FIXATION (ORIF) DISTAL RADIAL FRACTURE Left 05/16/2019   Procedure: OPEN REDUCTION INTERNAL FIXATION (ORIF) DISTAL RADIAL FRACTURE;  Surgeon: Sissy Cough, MD;  Location: [facility] OR;  Service: Orthopedics;  Laterality: Left;   SPINAL FUSION     UPPER GI ENDOSCOPY     Family History  Family History  Problem Relation Age of Onset   Heart disease Mother    Asthma Father    Heart disease Brother 20       CAD   Social History  reports that she has never smoked. She has never used smokeless tobacco. She reports that she does not drink alcohol and does not use drugs. Allergies No Known Allergies Home medications Prior to  Admission medications   Medication Sig Start Date End Date Taking? Authorizing Provider  acetaminophen  (TYLENOL ) 650 MG CR tablet Take 1,300 mg by mouth in the morning.   Yes [provider]  albuterol  (VENTOLIN  HFA) 108 (90 Base) MCG/ACT inhaler Inhale 2 puffs into the lungs every 6 (six) hours as needed for wheezing or shortness of breath. Patient taking differently: Inhale 2 puffs into the lungs 3 (three) times daily as needed for wheezing or shortness of breath. 11/29/19  Yes Olalere, Adewale A, MD  amLODipine  (NORVASC ) 5 MG tablet Take 5 mg by mouth daily.   Yes [provider]  aspirin EC 81 MG tablet Take 81 mg by mouth in the morning. CARDIAC EVENT PREVENTION   Yes [provider]  benzonatate  (TESSALON ) 200 MG capsule Take 1 capsule (200 mg total) by mouth 3 (three) times daily as needed for  cough. 09/22/22  Yes Olalere, Adewale A, MD  Cholecalciferol (VITAMIN D3) 5000 units TABS Take 5,000 Units by mouth daily with breakfast.    Yes [provider]  clopidogrel (PLAVIX) 75 MG tablet Take 75 mg by mouth daily.   Yes [provider]  ezetimibe (ZETIA) 10 MG tablet Take 10 mg by mouth daily.   Yes [provider]  Fluticasone -Umeclidin-Vilant (TRELEGY ELLIPTA ) 100-62.5-25 MCG/ACT AEPB Inhale 1 puff into the lungs daily. 03/19/23  Yes Olalere, Adewale A, MD  Hydrocortisone (PREPARATION H SOOTHING RELIEF EX) Place 1 application  rectally 4 (four) times daily as needed (for hemorrhoids).   Yes [provider]  ipratropium-albuterol  (DUONEB) 0.5-2.5 (3) MG/3ML SOLN Take 3 mLs by nebulization every 4 (four) hours as needed (for shortness of breath).   Yes [provider]  levothyroxine (SYNTHROID) 75 MCG tablet Take 75 mcg by mouth daily before breakfast.   Yes [provider]  OXYGEN Inhale 2 L/min into the lungs as needed (for shortness of breath).   Yes [provider]  pantoprazole (PROTONIX) 40 MG tablet Take 40 mg by mouth daily before breakfast.   Yes [provider]  ranolazine  (RANEXA ) 500 MG 12 hr tablet Take 500 mg by mouth in the morning and at bedtime.   Yes [provider]  rosuvastatin (CRESTOR) 40 MG tablet Take 40 mg by mouth daily.   Yes [provider]  sertraline (ZOLOFT) 50 MG tablet Take 50 mg by mouth daily.   Yes [provider]  Spacer/Aero-Holding Chambers (AEROCHAMBER MV) inhaler Use as instructed 11/29/19  Yes Olalere, Adewale A, MD  SYSTANE 0.4-0.3 % GEL ophthalmic gel Place 1 Application into both eyes 2 (two) times daily.   Yes [provider]  torsemide  (DEMADEX ) 20 MG tablet Take 1 tablet (20 mg total) by mouth 2 (two) times daily. Patient taking differently: Take 60 mg by mouth 2 (two) times daily. 09/10/20 10/20/24 Yes Lonni Slain, MD  vitamin  E 45 MG (100 UNITS) capsule Take 100 Units by mouth daily.   Yes [provider]  nitroGLYCERIN (NITROSTAT) 0.4 MG SL tablet Place 0.4 mg under the tongue every 5 (five) minutes as needed for chest pain.    [provider]  traMADol (ULTRAM) 50 MG tablet Take 50 mg by mouth daily as needed for moderate pain.    [provider]     Vitals:   10/24/24 0700 10/24/24 0812 10/24/24 1046 10/24/24 1149  BP: 116/74 124/89 116/62 120/69  Pulse:  (!) 133 (!) 136 (!) 133  Resp:  20 (!) 22 (!) 24  Temp:  98.1 F (36.7 C) 98.1 F (36.7 C) (!) 97.3 F (36.3 C)  TempSrc:  Oral Oral Oral  SpO2:  95% 95%   Weight:      Height:       Exam Gen pt was on [place] then on bipap Elderly female, deconditioned Sclera anicteric, throat clear  No jvd or bruits Chest clear bilat to bases RRR no MRG Abd soft ntnd no mass or ascites +bs Ext mild 1+ nonpitting edema LE's  Neuro is alert , nf  IP meds of interest: IVlasix 40mg  bid x 1 11/16 IV abx IV cardizem ongoing today IV LR 75 cc/hr 11/13 to 11/15, now off IV contrast 80ml on 11/13, no other contrast   BP's on admit 11/13 were mostly 160-180/ 70-90s BP's on 11/14 were 107- 131/ 65-75 BP's on 11/15 were 109- 145/ 47-63 BP's on 11/16 were  128- 167/ 76- 86 BP's on 11/17 are     94- 124/ 62- 75, hR 90-130's Total I/O's are 4.3 in and 1.9 out  CXR 11/16 - looked +vasc congestion, early IS CXR 11/17- looks better, close to normal   Date   Creat  eGFR (ml/min) 2019   0.93- 1.14 2020   0.92- 1.07 Sept 2021  1.19  48 ml/min   Oct 2021  1.13  52 ml/min 11/13   1.11  48 ml/min   11/14   1.29  40 ml/min 11/15   1.14  46 ml/min 11/16   1.29    10/24/24  2.16  21 ml/min    Assessment/ Plan: AKI on CKD 3a: b/l creatinine is 1.1- 1.3 from this admission, eGFR 40-48 ml/min. Creatinine up today after getting IV lasix  for acute/ chronic resp failure likely related to vol overload and IVF's. CXR looks better today. Off and on low  BP's could have also contributed to AKI. Pt did get IV contrast once on 11/13 but renal function was stable thereafter. Will need UA and renal US , urine lytes. Will hold off on IVF's. Not sure cause of AKI at this time. Try to keep SBP > 100-105. Will give one more IV lasix  40mg  today. F/u labs in am.  Afib/ RVR: f/b cardiology, getting BB, eliquis and IV cardizem.  Partial SBO vs ileus: per pmd and gen surgery H/o HFpEF: seen by cardiology Chronic resp failure: on home O2 around the clock       Myer Fret  MD [facility] 10/24/2024, 12:42 PM  Recent Labs  Lab 10/20/24 1353 10/20/24 1404 10/21/24 0459 10/22/24 0524 10/23/24 0735 10/24/24 0505  HGB 12.9   < > 11.6*  --  12.0 11.9*  ALBUMIN 4.0  --  3.4*  --   --   --   CALCIUM 9.7  --  9.2   < > 9.2 8.7*  PHOS  --   --   --   --  2.2*  --   CREATININE 1.11*   < > 1.29*   < > 1.29* 2.16*  K 3.9   < > 4.3   < > 4.6 4.1   < > = values in this interval not displayed.   Inpatient medications:  apixaban  2.5 mg Oral BID   arformoterol  15 mcg Nebulization BID   budesonide (PULMICORT) nebulizer solution  0.25 mg Nebulization BID   ezetimibe  10 mg Oral Daily   feeding supplement  1 Container Oral TID BM   ipratropium-albuterol   3 mL Nebulization Q6H   levothyroxine  75  mcg Oral QAC breakfast   methylPREDNISolone (SOLU-MEDROL) injection  40 mg Intravenous Daily   metoprolol tartrate  25 mg Oral Q6H   pantoprazole (PROTONIX) IV  40 mg Intravenous Q12H   ranolazine   500 mg Oral BID   rosuvastatin  40 mg Oral Daily   sertraline  50 mg Oral Daily    azithromycin 500 mg (10/23/24 1106)   cefTRIAXone (ROCEPHIN)  IV 2 g (10/24/24 1017)   diltiazem (CARDIZEM) infusion 10 mg/hr (10/24/24 0814)   acetaminophen  **OR** acetaminophen , albuterol , antiseptic oral rinse, hydrALAZINE, HYDROcodone -acetaminophen , lip balm, morphine injection, nitroGLYCERIN, ondansetron  **OR** ondansetron  (ZOFRAN ) IV

## 2024-10-24 NOTE — Progress Notes (Signed)
 PHARMACY - ANTICOAGULATION CONSULT NOTE  Pharmacy Consult for IV heparin Indication: Afib (while unable to take PO)  No Known Allergies  Patient Measurements: Height: 5' 2 (157.5 cm) Weight: 90.6 kg (199 lb 11.8 oz) IBW/kg (Calculated) : 50.1 HEPARIN DW (KG): 71  Vital Signs: Temp: 98.1 F (36.7 C) (11/17 1555) Temp Source: Axillary (11/17 1555) BP: 106/74 (11/17 1313) Pulse Rate: 126 (11/17 1313)  Labs: Recent Labs    10/22/24 0524 10/23/24 0735 10/24/24 0505  HGB  --  12.0 11.9*  HCT  --  38.6 37.5  PLT  --  273 278  CREATININE 1.14* 1.29* 2.16*    Estimated Creatinine Clearance: 18.8 mL/min (A) (by C-G formula based on SCr of 2.16 mg/dL (H)).   Medical History: Past Medical History:  Diagnosis Date   Ambulates with cane    straight   At risk for falls    CAD (coronary artery disease)    Cataracts, bilateral    GERD (gastroesophageal reflux disease)    Hypercholesterolemia    Hypertension    Hypothyroidism    Major depression    Musculoskeletal back pain    CHRONIC LEFT-SIDED DISCOMFORT   Obstructive sleep apnea on CPAP    uses CPAP nightly   Osteopenia    PVD (peripheral vascular disease)    Stable angina    SVD (spontaneous vaginal delivery)    x 2   Vitamin D deficiency    Wears glasses    Wears partial dentures    upper    Medications:  Medications Prior to Admission  Medication Sig Dispense Refill Last Dose/Taking   acetaminophen  (TYLENOL ) 650 MG CR tablet Take 1,300 mg by mouth in the morning.   10/19/2024 Morning   albuterol  (VENTOLIN  HFA) 108 (90 Base) MCG/ACT inhaler Inhale 2 puffs into the lungs every 6 (six) hours as needed for wheezing or shortness of breath. (Patient taking differently: Inhale 2 puffs into the lungs 3 (three) times daily as needed for wheezing or shortness of breath.) 8 g 5 Unknown   amLODipine  (NORVASC ) 5 MG tablet Take 5 mg by mouth daily.   10/19/2024 Morning   aspirin EC 81 MG tablet Take 81 mg by mouth in the  morning. CARDIAC EVENT PREVENTION   10/19/2024 at  8:00 AM   benzonatate  (TESSALON ) 200 MG capsule Take 1 capsule (200 mg total) by mouth 3 (three) times daily as needed for cough. 60 capsule 1 Unknown   Cholecalciferol (VITAMIN D3) 5000 units TABS Take 5,000 Units by mouth daily with breakfast.    10/19/2024 Morning   clopidogrel (PLAVIX) 75 MG tablet Take 75 mg by mouth daily.   10/19/2024 at  8:00 AM   ezetimibe (ZETIA) 10 MG tablet Take 10 mg by mouth daily.   10/19/2024 Morning   Fluticasone -Umeclidin-Vilant (TRELEGY ELLIPTA ) 100-62.5-25 MCG/ACT AEPB Inhale 1 puff into the lungs daily. 60 each 3 10/20/2024 Morning   Hydrocortisone (PREPARATION H SOOTHING RELIEF EX) Place 1 application  rectally 4 (four) times daily as needed (for hemorrhoids).   Unknown   ipratropium-albuterol  (DUONEB) 0.5-2.5 (3) MG/3ML SOLN Take 3 mLs by nebulization every 4 (four) hours as needed (for shortness of breath).   Unknown   levothyroxine (SYNTHROID) 75 MCG tablet Take 75 mcg by mouth daily before breakfast.   10/19/2024 Morning   OXYGEN Inhale 2 L/min into the lungs as needed (for shortness of breath).   Taking As Needed   pantoprazole (PROTONIX) 40 MG tablet Take 40 mg by mouth daily before  breakfast.   10/19/2024 Morning   ranolazine  (RANEXA ) 500 MG 12 hr tablet Take 500 mg by mouth in the morning and at bedtime.   10/19/2024 Bedtime   rosuvastatin (CRESTOR) 40 MG tablet Take 40 mg by mouth daily.   10/19/2024 Morning   sertraline (ZOLOFT) 50 MG tablet Take 50 mg by mouth daily.   10/19/2024 Morning   Spacer/Aero-Holding Chambers (AEROCHAMBER MV) inhaler Use as instructed 1 each 0 Unknown   SYSTANE 0.4-0.3 % GEL ophthalmic gel Place 1 Application into both eyes 2 (two) times daily.   10/19/2024 Evening   torsemide  (DEMADEX ) 20 MG tablet Take 1 tablet (20 mg total) by mouth 2 (two) times daily. (Patient taking differently: Take 60 mg by mouth 2 (two) times daily.) 180 tablet 3 10/19/2024 Bedtime   vitamin E 45 MG  (100 UNITS) capsule Take 100 Units by mouth daily.   10/19/2024 Morning   nitroGLYCERIN (NITROSTAT) 0.4 MG SL tablet Place 0.4 mg under the tongue every 5 (five) minutes as needed for chest pain.   Unknown   traMADol (ULTRAM) 50 MG tablet Take 50 mg by mouth daily as needed for moderate pain.   Unknown   Scheduled:   apixaban  2.5 mg Oral BID   [START ON 10/25/2024] atorvastatin  40 mg Oral Daily   Chlorhexidine  Gluconate Cloth  6 each Topical Daily   ezetimibe  10 mg Oral Daily   feeding supplement  1 Container Oral TID BM   ipratropium  0.5 mg Nebulization Q8H   levothyroxine  75 mcg Oral QAC breakfast   metoprolol tartrate  25 mg Oral Q6H   pantoprazole (PROTONIX) IV  40 mg Intravenous Q12H   ranolazine   500 mg Oral BID   sertraline  50 mg Oral Daily   PRN: acetaminophen  **OR** acetaminophen , antiseptic oral rinse, hydrALAZINE, HYDROcodone -acetaminophen , levalbuterol, lip balm, morphine injection, nitroGLYCERIN, ondansetron  **OR** ondansetron  (ZOFRAN ) IV  Assessment: 48 yoF with PMH HTN, CHF, chronic respiratory failure admitted 11/13 for SBO. Started on Eliquis earlier today (11/17) for new Afib w/ RVR. Now transferring to SDU today for worsening respiratory failure. Pharmacy consulted to convert Eliquis to heparin infusion in anticipation of possible procedures needed.  Baseline INR, aPTT: not done Prior anticoagulation:  Eliquis 2.5 mg starting 11/17; received only one dose at 12:31 Previously on prophylactic Lovenox 40 mg/d  Significant events:  Today, 10/24/2024: CBC: Hgb slightly low but stable over the past 4 days; Plt stable WNL SCr increased sharply overnight (baseline appears to be ~1.1) No bleeding or infusion issues per nursing  Goal of Therapy: Heparin level 0.3-0.7 units/ml Monitor platelets by anticoagulation protocol: Yes  Plan: Start heparin 1000 units/hr IV infusion at midnight tonight (~12 hr after Eliquis dose) Check aPTT and heparin level 8 hrs after  start (given only a single dose of DOAC, possible heparin level may not be elevated at all) Daily CBC, daily heparin level once stable; aPTT as needed if heparin level appears falsely elevated Monitor for signs of bleeding or thrombosis  Bard Jeans, PharmD, BCPS [phone] 10/24/2024, 6:47 PM

## 2024-10-24 NOTE — Progress Notes (Signed)
 RN made aware of high HR.

## 2024-10-24 NOTE — Progress Notes (Signed)
 Rt placed pt on BIPAP due to WOB and SOB. RN and family at bedside.

## 2024-10-25 ENCOUNTER — Inpatient Hospital Stay (HOSPITAL_COMMUNITY): Payer: Medicare (Managed Care)

## 2024-10-25 DIAGNOSIS — I4891 Unspecified atrial fibrillation: Secondary | ICD-10-CM | POA: Diagnosis not present

## 2024-10-25 DIAGNOSIS — K56609 Unspecified intestinal obstruction, unspecified as to partial versus complete obstruction: Secondary | ICD-10-CM | POA: Diagnosis not present

## 2024-10-25 DIAGNOSIS — I10 Essential (primary) hypertension: Secondary | ICD-10-CM | POA: Diagnosis not present

## 2024-10-25 DIAGNOSIS — I5030 Unspecified diastolic (congestive) heart failure: Secondary | ICD-10-CM | POA: Diagnosis not present

## 2024-10-25 DIAGNOSIS — I2510 Atherosclerotic heart disease of native coronary artery without angina pectoris: Secondary | ICD-10-CM | POA: Diagnosis not present

## 2024-10-25 LAB — CBC
HCT: 36.5 % (ref 36.0–46.0)
Hemoglobin: 11.7 g/dL — ABNORMAL LOW (ref 12.0–15.0)
MCH: 28.8 pg (ref 26.0–34.0)
MCHC: 32.1 g/dL (ref 30.0–36.0)
MCV: 89.9 fL (ref 80.0–100.0)
Platelets: 306 K/uL (ref 150–400)
RBC: 4.06 MIL/uL (ref 3.87–5.11)
RDW: 13.9 % (ref 11.5–15.5)
WBC: 10 K/uL (ref 4.0–10.5)
nRBC: 0 % (ref 0.0–0.2)

## 2024-10-25 LAB — BASIC METABOLIC PANEL WITH GFR
Anion gap: 15 (ref 5–15)
Anion gap: 16 — ABNORMAL HIGH (ref 5–15)
BUN: 43 mg/dL — ABNORMAL HIGH (ref 8–23)
BUN: 47 mg/dL — ABNORMAL HIGH (ref 8–23)
CO2: 22 mmol/L (ref 22–32)
CO2: 21 mmol/L — ABNORMAL LOW (ref 22–32)
Calcium: 8.4 mg/dL — ABNORMAL LOW (ref 8.9–10.3)
Calcium: 8 mg/dL — ABNORMAL LOW (ref 8.9–10.3)
Chloride: 96 mmol/L — ABNORMAL LOW (ref 98–111)
Chloride: 96 mmol/L — ABNORMAL LOW (ref 98–111)
Creatinine, Ser: 3.29 mg/dL — ABNORMAL HIGH (ref 0.44–1.00)
Creatinine, Ser: 3.45 mg/dL — ABNORMAL HIGH (ref 0.44–1.00)
GFR, Estimated: 13 mL/min — ABNORMAL LOW (ref >60)
GFR, Estimated: 12 mL/min — ABNORMAL LOW (ref >60)
Glucose, Bld: 140 mg/dL — ABNORMAL HIGH (ref 70–99)
Glucose, Bld: 130 mg/dL — ABNORMAL HIGH (ref 70–99)
Potassium: 4.5 mmol/L (ref 3.5–5.1)
Potassium: 4.4 mmol/L (ref 3.5–5.1)
Sodium: 133 mmol/L — ABNORMAL LOW (ref 135–145)
Sodium: 133 mmol/L — ABNORMAL LOW (ref 135–145)

## 2024-10-25 LAB — APTT
aPTT: 45 s — ABNORMAL HIGH (ref 24–36)
aPTT: 147 s — ABNORMAL HIGH (ref 24–36)

## 2024-10-25 LAB — HEPARIN LEVEL (UNFRACTIONATED): Heparin Unfractionated: 0.76 [IU]/mL — ABNORMAL HIGH (ref 0.30–0.70)

## 2024-10-25 MED ORDER — SODIUM CHLORIDE 0.9 % IV SOLN
3.0000 g | Freq: Two times a day (BID) | INTRAVENOUS | Status: DC
  Administered 2024-10-25 – 2024-10-26 (×2): 3 g via INTRAVENOUS
  Filled 2024-10-25 (×2): qty 8

## 2024-10-25 MED ORDER — HYDROMORPHONE HCL 1 MG/ML IJ SOLN
0.5000 mg | INTRAMUSCULAR | Status: DC | PRN
  Administered 2024-10-25: 0.5 mg via INTRAVENOUS
  Filled 2024-10-25: qty 1

## 2024-10-25 MED ORDER — HEPARIN BOLUS VIA INFUSION
2000.0000 [IU] | Freq: Once | INTRAVENOUS | Status: AC
  Administered 2024-10-25: 2000 [IU] via INTRAVENOUS
  Filled 2024-10-25: qty 2000

## 2024-10-25 MED ORDER — IPRATROPIUM BROMIDE 0.02 % IN SOLN
0.5000 mg | Freq: Three times a day (TID) | RESPIRATORY_TRACT | Status: DC
  Administered 2024-10-25 – 2024-10-26 (×4): 0.5 mg via RESPIRATORY_TRACT
  Filled 2024-10-25 (×4): qty 2.5

## 2024-10-25 MED ORDER — DIATRIZOATE MEGLUMINE & SODIUM 66-10 % PO SOLN
90.0000 mL | Freq: Once | ORAL | Status: AC
  Administered 2024-10-25: 90 mL via ORAL
  Filled 2024-10-25: qty 90

## 2024-10-25 NOTE — H&P (View-Only) (Signed)
 Progress Note  Patient Name: Jennifer [name] Date of Encounter: 10/25/2024  [facility] Cardiologist: Jennifer Bruckner, MD   Interval Summary   Denies any chest pain or shortness of breath.  Vital Signs Vitals:   10/25/24 0715 10/25/24 0800 10/25/24 0810 10/25/24 0823  BP: 107/80 (!) 120/50    Pulse: 75 74    Resp: (!) 27 (!) 27    Temp:   97.7 F (36.5 C)   TempSrc:   Oral   SpO2: 99% 100%  98%  Weight:      Height:        Intake/Output Summary (Last 24 hours) at 10/25/2024 0858 Last data filed at 10/25/2024 0720 Gross per 24 hour  Intake 923.13 ml  Output 210 ml  Net 713.13 ml      10/24/2024    3:55 PM 10/21/2024    2:33 PM 08/01/2024    1:11 PM  Last 3 Weights  Weight (lbs) 199 lb 11.8 oz 198 lb 10.2 oz 189 lb  Weight (kg) 90.6 kg 90.1 kg 85.73 kg      Telemetry/ECG  Was in A-fib with heart rates in the 100s to 110s but converted to sinus rhythm at about 7:14 AM.  After converting to sinus rhythm heart rates have been in the 70s to 80s.- Personally Reviewed  Physical Exam  GEN: No acute distress.   Neck: No JVD Cardiac: RRR, no murmurs, rubs, or gallops.  Respiratory: Clear to auscultation bilaterally. GI: Abdomen distended and tender to palpation. Jennifer: No edema  Assessment & Plan  Jennifer [name] is a 89 yoF with Hx of CAD s/p remote LAD PCI, HFpEF, PAD, chronic respiratory failure on 2L who presented with partial SBO vs. Ileus and was found to be in Afib briefly last week and started on dilt gtt. She was in NSR with high PAC burden yesterday and went to Afib with RVR this AM (11/17) requiring higher doses of Dilt.   A-fib with RVR CHA2DS2-VASc Score = 5 [CHF History: 1, HTN History: 0, Diabetes History: 0, Stroke History: 0, Vascular Disease History: 1, Age Score: 2, Gender Score: 1].  Therefore, the patient's annual risk of stroke is 7.2 %.    Was initially placed on IV Cardizem.  In the morning on 11/17 was started on metoprolol tartrate 25  mg every 6 hours.  Blood pressures over the day continue to remain soft and heart rates continue to to remain elevated.  Because of this the patient was started on IV amiodarone. This morning at about 7:15 am the patient converted to sinus rhythm.  Continue IV amiodarone. May consider transitioning to oral amiodarone.  Continue IV heparin. May transition to Eliquis 5 mg twice daily when can take p.o. medications. Continue metoprolol.   HFpEF Echocardiogram showed normal LVEF of 65 to 70%, no RWMA, a 15 mm intracavitary gradient on the LV, normal RV systolic function, normal pulmonary artery pressure, and grossly normal valve function. Chest x-ray yesterday showed Persistent bilateral interstitial prominence with trace right pleural effusion. Appears somewhat euvolemic on exam. Weight at office visit on 07/2024 was 85.7 kgs.  Most recent weight this hospitalization was 90.6 kg's May consider ordering 40mg  IV lasix . Order RHC to assess volume status. Order abdominal ultrasound.   CAD PAD Hyperlipidemia Continue atorvastatin, and Zetia Stop Ranexa  due to worsening renal function.   AKI Creatinine has worsened to 3.29 today. Did receive contrast with imaging on 10/20/24. Nephrology has been consulted.   For questions or updates, please contact  [facility] Please consult [email or web address] for contact info under        Signed, Jennifer Limes, PA-C

## 2024-10-25 NOTE — Progress Notes (Signed)
 [facility] Progress Note  Subjective:  Pt seen in ICU room Jennifer [name], Jennifer [name]  Vitals:   10/25/24 1215 10/25/24 1300 10/25/24 1359 10/25/24 1400  BP: (!) 101/50 (!) 97/59  104/77  Pulse: 78 78  82  Resp: (!) 21 19  (!) 23  Temp:      TempSrc:      SpO2: (!) 89% 96% 97% 90%  Weight:      Height:        Exam: Gen pt was on [place] then on bipap Elderly female, deconditioned Sclera anicteric, throat clear  Jennifer jvd or bruits Chest clear bilat to bases RRR Jennifer MRG Abd soft ntnd Jennifer mass or ascites +bs Ext mild 1+ nonpitting edema LE's  Neuro is alert , nf   IP meds of interest: IVlasix 40mg  bid x 1 11/16 IV cardizem ongoing [name] IV LR 75 cc/hr 11/13 to 11/15, now off IV contrast 80ml on 11/13, Jennifer other contrast    BP's on admit 11/13 were mostly 160-180/ 70-90s BP's on 11/14 were 107- 131/ 65-75 BP's on 11/15 were 109- 145/ 47-63 BP's on 11/16 were  128- 167/ 76- 86 BP's on 11/17 are     94- 124/ 62- 75, hR 90-130's Total I/O's are 4.3 in and 1.9 out   CXR 11/16 - looked +vasc congestion, early IS CXR 11/17- looks better, close to normal  UA 11/17: mod LE, prot 100, many bact, > 50 wbc, 6-10 rbc/epis UNa > 30, UCreat 106 Renal US : 9.2/ 8.3 cm kidneys, Jennifer hydronephrosis   Date                             Creat               eGFR (ml/min) 2019                            0.93- 1.14 2020                            0.92- 1.07 Sept 2021                    1.19                 48 ml/min                     Oct 2021                     1.13                 52 ml/min 11/13                           1.11                 48 ml/min                     11/14                           1.29                 40 ml/min 11/15  1.14                 46 ml/min 11/16                           1.29                              10/24/24                      2.16                 21 ml/min             Assessment/ Plan: AKI on CKD  3a: b/l creatinine is 1.1- 1.3 from this admission, eGFR 40-48 ml/min. Pt did get IV contrast on 11/13 but renal function didn't drop until 11/16-11/17. Pt got IV lasix  for vol overload, on hold for now w/ rising creatinine. Renal US  w/o obstruction, UA showed wbc's > mild rbc's. UOP 60 cc yesterday, creat up to 3.2 [name]. Jennifer signs of uremia. AKI from hypotension vs diast CHF vs other. Oliguric. Not a good candidate for RRT due to significant comorbidities, have d/w family who are in agreement. Will follow.  Afib/ RVR: f/b cardiology, getting BB, eliquis and IV cardizem.  Partial SBO vs ileus: per pmd and gen surgery H/o HFpEF: seen by cardiology Chronic resp failure: on home O2 around the clock DNR-limited     Myer Fret MD  [facility] 10/25/2024, 2:52 PM  Recent Labs  Lab 10/20/24 1353 10/20/24 1404 10/21/24 0459 10/22/24 0524 10/23/24 0735 10/24/24 0505 10/25/24 0808  HGB 12.9   < > 11.6*  --  12.0 11.9* 11.7*  ALBUMIN 4.0  --  3.4*  --   --   --   --   CALCIUM 9.7  --  9.2   < > 9.2 8.7* 8.4*  PHOS  --   --   --   --  2.2*  --   --   CREATININE 1.11*   < > 1.29*   < > 1.29* 2.16* 3.29*  K 3.9   < > 4.3   < > 4.6 4.1 4.5   < > = values in this interval not displayed.   Jennifer results for input(s): IRON, TIBC, FERRITIN in the last 168 hours. Inpatient medications:  atorvastatin  40 mg Oral Daily   Chlorhexidine  Gluconate Cloth  6 each Topical Daily   ezetimibe  10 mg Oral Daily   feeding supplement  1 Container Oral TID BM   ipratropium  0.5 mg Nebulization TID   levothyroxine  75 mcg Oral QAC breakfast   metoprolol tartrate  25 mg Oral Q6H   pantoprazole (PROTONIX) IV  40 mg Intravenous Q12H   sertraline  50 mg Oral Daily    sodium chloride  Stopped (10/25/24 0013)   amiodarone 30 mg/hr (10/25/24 1424)   ampicillin-sulbactam (UNASYN) IV     azithromycin Stopped (10/25/24 1145)   heparin 1,200 Units/hr (10/25/24 1206)   acetaminophen  **OR** acetaminophen , antiseptic oral  rinse, hydrALAZINE, HYDROcodone -acetaminophen , HYDROmorphone (DILAUDID) injection, levalbuterol, lip balm, nitroGLYCERIN, ondansetron  **OR** ondansetron  (ZOFRAN ) IV

## 2024-10-25 NOTE — Progress Notes (Signed)
 NAME:  Jennifer [name], MRN:  [id number], DOB:  Apr 18, 1934, LOS: 5 ADMISSION DATE:  10/20/2024, CONSULTATION DATE: 10/25/24 REFERRING MD:  Dr. Owen, CHIEF COMPLAINT:  acute hypoxic respiratory failure   History of Present Illness:  Jennifer [name] is an 89 y/o F with a PMH significant for PVD, OSA on CPAP, MDD, GERD, CAD s/p PCI in 2009, HTN, HFpEF, and chronic hypoxic respiratory failure who presented to the hospital for SBO with hospital course c/b acute on chronic hypoxic respiratory failure requiring NIPPV. Pulmonology consulted for evaluation and management of the latter.   The patient initially presented with sharp abdominal pain, nausea and abdominal distention. In the ED, the patient underwent CT ABD/PLV that showed moderate diffuse small bowl dilatation with fecalization concerning for distal obstruction likely terminal ileum. Surgery was consulted and since patient had some flatulance, NGT was not placed. They followed patient and KUBs improved. They started clears yesterday.   This AM, patient developed tachypnea, worsening hypoxia, and atrial fibrillation with RVR. She was started on dilt drip, given lasix  80 mg IVP x 1, and placed on BiPAP. This has improved her clinical status. CXR done shows bilateral pulmonary vascular congestion with cardiomegaly. She was also started on CAP coverage with ceftriaxone and azithromycin as well as steroids by primary medical team.  Of note, PFTs performed on 08/2023, does not show any evidence of COPD. DLCO is reduced suggestive of pulmonary vascular disease. Echo on 3/24 shows normal LVEF 60 - 65%, normal RV size and function.   Labs from this AM, BNP 733, Cr 1.29, WBC 7.1  Pertinent  Medical History   Past Medical History:  Diagnosis Date   Ambulates with cane    straight   At risk for falls    CAD (coronary artery disease)    Cataracts, bilateral    GERD (gastroesophageal reflux disease)    Hypercholesterolemia    Hypertension     Hypothyroidism    Major depression    Musculoskeletal back pain    CHRONIC LEFT-SIDED DISCOMFORT   Obstructive sleep apnea on CPAP    uses CPAP nightly   Osteopenia    PVD (peripheral vascular disease)    Stable angina    SVD (spontaneous vaginal delivery)    x 2   Vitamin D deficiency    Wears glasses    Wears partial dentures    upper   Significant Hospital Events: Including procedures, antibiotic start and stop dates in addition to other pertinent events   11/13: ED for abdominal pain >SBO admitted to hospitalist  11/15: starting clear liquids  11/16: HR increase to 160-170s, SOB when transfer from bed to chair > cardizem bolus and gtt, EKG with RVR; cardiology consult. CCM consult  11/17: reconsult CCM, moving to SDU for amio gtt, on BIPAP 11/18: NAEON; off BiPAP this morning, converted to SR  Interim History / Subjective:  Off bipap this morning; still some labored breathing but no distress, converted to SR Objective    Blood pressure (!) 129/96, pulse 85, temperature 97.7 F (36.5 C), temperature source Oral, resp. rate 19, height 5' 2 (1.575 m), weight 90.6 kg, SpO2 91%.    FiO2 (%):  [28 %] 28 %   Intake/Output Summary (Last 24 hours) at 10/25/2024 1024 Last data filed at 10/25/2024 0720 Gross per 24 hour  Intake 923.13 ml  Output 210 ml  Net 713.13 ml  Overall + 1.5 L Filed Weights   10/21/24 1433 10/24/24 1555  Weight: 90.1 kg 90.6 kg  Examination: General: older female, laying in bed, no acute distress  HENT: anicteric sclera, mucous membranes dry, bipap Lungs: expiratory wheezing, bipap  Cardiovascular: irregularly irregular rhythm, do not appreciate murmur Abdomen: rounded, soft Neuro: awake, oriented, follows commands GU: no foley   Resolved problem list   Assessment and Plan   #Acute on Chronic Hypoxic Respiratory Failure: #Acute on Chronic Diastolic Heart Failure: #Concern for aspiration pneumonia: #OSA on CPAP #SBO   - cardiology  following, appreciate help in management  - con't amiodarone, heparin gtt  - needs RHC for volume status, abdominal ultrasound  - off BiPAP for now, would restart with any drowsiness or worsening respiratory effort and nightly  - d/c'd steroids, arformeterol and and budesonide yesterday by ccm attending with no PFT evidence of COPD  - CAP coverage with rocephin, azithromycin but have suspicion this is all HF driven (to end 11/21) - her UA was UTI - rocephin should cover unless resistant bug  - zetia, lipitor, synthroid, metoprolol when she is able to take PO  - recommend formal SLP to r/o aspiration component - ordered  - xopenex preferred with RVR  - IS when not on bipap   Pulm will follow along. TRH to medically manage in SDU.   Labs   CBC: Recent Labs  Lab 10/20/24 1353 10/20/24 1404 10/21/24 0459 10/23/24 0735 10/24/24 0505 10/25/24 0808  WBC 9.7  --  9.9 7.1 11.5* 10.0  NEUTROABS 8.5*  --   --   --   --   --   HGB 12.9 13.3 11.6* 12.0 11.9* 11.7*  HCT 40.7 39.0 37.3 38.6 37.5 36.5  MCV 90.6  --  91.9 90.8 89.9 89.9  PLT 303  --  306 273 278 306    Basic Metabolic Panel: Recent Labs  Lab 10/21/24 0459 10/22/24 0524 10/23/24 0735 10/24/24 0505 10/25/24 0808  NA 140 143 137 137 133*  K 4.3 4.4 4.6 4.1 4.5  CL 103 107 101 100 96*  CO2 29 28 26 23 22   GLUCOSE 116* 96 153* 140* 140*  BUN 19 15 15  28* 43*  CREATININE 1.29* 1.14* 1.29* 2.16* 3.29*  CALCIUM 9.2 8.9 9.2 8.7* 8.4*  MG  --  2.3 2.3  --   --   PHOS  --   --  2.2*  --   --    GFR: Estimated Creatinine Clearance: 12.4 mL/min (A) (by C-G formula based on SCr of 3.29 mg/dL (H)). Recent Labs  Lab 10/21/24 0459 10/23/24 0735 10/23/24 0806 10/24/24 0505 10/25/24 0808  PROCALCITON  --   --  0.33  --   --   WBC 9.9 7.1  --  11.5* 10.0    Liver Function Tests: Recent Labs  Lab 10/20/24 1353 10/21/24 0459  AST 24 25  ALT 8 <5  ALKPHOS 126 99  BILITOT 0.5 0.6  PROT 8.4* 7.1  ALBUMIN 4.0 3.4*    Recent Labs  Lab 10/20/24 1353  LIPASE 28   No results for input(s): AMMONIA in the last 168 hours.  ABG    Component Value Date/Time   TCO2 30 10/20/2024 1404     Coagulation Profile: No results for input(s): INR, PROTIME in the last 168 hours.  Cardiac Enzymes: No results for input(s): CKTOTAL, CKMB, CKMBINDEX, TROPONINI in the last 168 hours.  HbA1C: No results found for: HGBA1C  CBG: No results for input(s): GLUCAP in the last 168 hours.  Review of Systems:   As above  Past Medical History:  She,  has a past medical history of Ambulates with cane, At risk for falls, CAD (coronary artery disease), Cataracts, bilateral, GERD (gastroesophageal reflux disease), Hypercholesterolemia, Hypertension, Hypothyroidism, Major depression, Musculoskeletal back pain, Obstructive sleep apnea on CPAP, Osteopenia, PVD (peripheral vascular disease), Stable angina, SVD (spontaneous vaginal delivery), Vitamin D deficiency, Wears glasses, and Wears partial dentures.   Surgical History:   Past Surgical History:  Procedure Laterality Date   ANKLE SURGERY Left    BACK SURGERY     CARDIAC CATHETERIZATION  06/2008   PCI to LAD with 3.0 x 12 mm DES   COLONOSCOPY     EYE SURGERY     remove cataracts - bilateral   MULTIPLE TOOTH EXTRACTIONS     upper teeth - upper dentures   OPEN REDUCTION INTERNAL FIXATION (ORIF) DISTAL RADIAL FRACTURE Left 05/16/2019   Procedure: OPEN REDUCTION INTERNAL FIXATION (ORIF) DISTAL RADIAL FRACTURE;  Surgeon: Sissy Cough, MD;  Location: [facility] OR;  Service: Orthopedics;  Laterality: Left;   SPINAL FUSION     UPPER GI ENDOSCOPY       Social History:   reports that she has never smoked. She has never used smokeless tobacco. She reports that she does not drink alcohol and does not use drugs.   Family History:  Her family history includes Asthma in her father; Heart disease in her mother; Heart disease (age of onset: 52) in her brother.    Allergies No Known Allergies   Home Medications  Prior to Admission medications   Medication Sig Start Date End Date Taking? Authorizing Provider  acetaminophen  (TYLENOL ) 650 MG CR tablet Take 1,300 mg by mouth in the morning.   Yes [provider]  albuterol  (VENTOLIN  HFA) 108 (90 Base) MCG/ACT inhaler Inhale 2 puffs into the lungs every 6 (six) hours as needed for wheezing or shortness of breath. Patient taking differently: Inhale 2 puffs into the lungs 3 (three) times daily as needed for wheezing or shortness of breath. 11/29/19  Yes Olalere, Adewale A, MD  amLODipine  (NORVASC ) 5 MG tablet Take 5 mg by mouth daily.   Yes [provider]  aspirin EC 81 MG tablet Take 81 mg by mouth in the morning. CARDIAC EVENT PREVENTION   Yes [provider]  benzonatate  (TESSALON ) 200 MG capsule Take 1 capsule (200 mg total) by mouth 3 (three) times daily as needed for cough. 09/22/22  Yes Olalere, Adewale A, MD  Cholecalciferol (VITAMIN D3) 5000 units TABS Take 5,000 Units by mouth daily with breakfast.    Yes [provider]  clopidogrel (PLAVIX) 75 MG tablet Take 75 mg by mouth daily.   Yes [provider]  ezetimibe (ZETIA) 10 MG tablet Take 10 mg by mouth daily.   Yes [provider]  Fluticasone -Umeclidin-Vilant (TRELEGY ELLIPTA ) 100-62.5-25 MCG/ACT AEPB Inhale 1 puff into the lungs daily. 03/19/23  Yes Olalere, Adewale A, MD  Hydrocortisone (PREPARATION H SOOTHING RELIEF EX) Place 1 application  rectally 4 (four) times daily as needed (for hemorrhoids).   Yes [provider]  ipratropium-albuterol  (DUONEB) 0.5-2.5 (3) MG/3ML SOLN Take 3 mLs by nebulization every 4 (four) hours as needed (for shortness of breath).   Yes [provider]  levothyroxine (SYNTHROID) 75 MCG tablet Take 75 mcg by mouth daily before breakfast.   Yes [provider]  OXYGEN Inhale 2 L/min into the lungs as needed (for shortness of breath).   Yes  [provider]  pantoprazole (PROTONIX) 40 MG tablet Take 40 mg by mouth  daily before breakfast.   Yes [provider]  ranolazine  (RANEXA ) 500 MG 12 hr tablet Take 500 mg by mouth in the morning and at bedtime.   Yes [provider]  rosuvastatin (CRESTOR) 40 MG tablet Take 40 mg by mouth daily.   Yes [provider]  sertraline (ZOLOFT) 50 MG tablet Take 50 mg by mouth daily.   Yes [provider]  Spacer/Aero-Holding Chambers (AEROCHAMBER MV) inhaler Use as instructed 11/29/19  Yes Olalere, Adewale A, MD  SYSTANE 0.4-0.3 % GEL ophthalmic gel Place 1 Application into both eyes 2 (two) times daily.   Yes [provider]  torsemide  (DEMADEX ) 20 MG tablet Take 1 tablet (20 mg total) by mouth 2 (two) times daily. Patient taking differently: Take 60 mg by mouth 2 (two) times daily. 09/10/20 10/20/24 Yes Lonni Slain, MD  vitamin E 45 MG (100 UNITS) capsule Take 100 Units by mouth daily.   Yes [provider]  nitroGLYCERIN (NITROSTAT) 0.4 MG SL tablet Place 0.4 mg under the tongue every 5 (five) minutes as needed for chest pain.    [provider]  traMADol (ULTRAM) 50 MG tablet Take 50 mg by mouth daily as needed for moderate pain.    [provider]    CC time: 3  The patient is critically ill with multiple organ system failure and requires high complexity decision making for assessment and support, frequent evaluation and titration of therapies, advanced monitoring, review of radiographic studies and interpretation of complex data.    Critical Care Time devoted to patient care services, exclusive of separately billable procedures, described in this note is 32   Tinnie FORBES Adolph DEVONNA  Pulmonary & Critical Care 10/25/24 10:24 AM  Please see [email or web address] for pager details.  From 7A-7P if no response, please call [phone] After hours, please call ELink [phone]

## 2024-10-25 NOTE — Evaluation (Addendum)
 Clinical/Bedside Swallow Evaluation Patient Details  Name: Jennifer [name] MRN: [id number] Date of Birth: 1934-03-18  Today's Date: 10/25/2024 Time: SLP Start Time (ACUTE ONLY): 1810 SLP Stop Time (ACUTE ONLY): 1830 SLP Time Calculation (min) (ACUTE ONLY): 20 min  Past Medical History:  Past Medical History:  Diagnosis Date   Ambulates with cane    straight   At risk for falls    CAD (coronary artery disease)    Cataracts, bilateral    GERD (gastroesophageal reflux disease)    Hypercholesterolemia    Hypertension    Hypothyroidism    Major depression    Musculoskeletal back pain    CHRONIC LEFT-SIDED DISCOMFORT   Obstructive sleep apnea on CPAP    uses CPAP nightly   Osteopenia    PVD (peripheral vascular disease)    Stable angina    SVD (spontaneous vaginal delivery)    x 2   Vitamin D deficiency    Wears glasses    Wears partial dentures    upper   Past Surgical History:  Past Surgical History:  Procedure Laterality Date   ANKLE SURGERY Left    BACK SURGERY     CARDIAC CATHETERIZATION  06/2008   PCI to LAD with 3.0 x 12 mm DES   COLONOSCOPY     EYE SURGERY     remove cataracts - bilateral   MULTIPLE TOOTH EXTRACTIONS     upper teeth - upper dentures   OPEN REDUCTION INTERNAL FIXATION (ORIF) DISTAL RADIAL FRACTURE Left 05/16/2019   Procedure: OPEN REDUCTION INTERNAL FIXATION (ORIF) DISTAL RADIAL FRACTURE;  Surgeon: Sissy Cough, MD;  Location: [facility] OR;  Service: Orthopedics;  Laterality: Left;   SPINAL FUSION     UPPER GI ENDOSCOPY     HPI:  89 year old with history of OSA, GERD, hypertension, CHF, chronic respiratory failure presenting with small bowel obstruction, acute on chronic hypoxic respiratory failure. Ileus is being managed conservatively by surgery. Hospital course complicated by atrial fibrillation with RVR, ongoing respiratory failure requiring BiPAP.   She was started on clear liquids and then was made NPO due to increased respiratory issues.  Imaging of abdomen showed  Worsening small bowel dilatation, concerning for distal small bowel  obstruction or ileus, without a definite transition zone identified.  2. Large periumbilical hernia containing a portion of the transverse colon without obstruction.  3. Sigmoid diverticulosis without evidence of diverticulitis.  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 She also reports her that she is not able to have the surgery repeated.  She also has h/o spontaneous reflux and esophageal dysmotility seen on esophagram in 2019.  She reports her dysphagia impacts her intake negatively PTA and states she tolerates liquids better than solids. Small bore tube attempted to be placed but pt was unable to tolerate and it was removed.  Prior brain imaging 01/2023 showed Small lacunar infarct or prominent perivascular space within the  right basal ganglia/internal capsule, new from the prior head CT of  04/30/2019 but otherwise age-indetermine.  Pt's chest imaging today showed  Bibasilar subsegmental consolidative changes may represent  atelectasis but concerning for infiltrate. Follow-up to resolution  recommended.  2. Trace left pleural effusion.  3. Moderate size hiatal hernia containing a portion of the stomach.  Swallow evaluation ordered.    Assessment / Plan / Recommendation  Clinical Impression  Patient's CN exam is unremarkable and her voice is clear. She does appear with significant oral  coating that did not clear with repeated dental brushing - raising concerns for potential oral candidiasis.  She was observed with minimal intake including Italian Ice,  [employer] and water.    No clinical indication of dysphagia with all Italian Ice boluses but with all liquids,  pt demonstrated repeated eructation - which she states is worse than baseline.  Given her extensive esophageal dysphagia - she may benefit from repeating an esophagram  (? with water soluble contrast if MD indicates) to better assess her esophageal swallow function (with cursory look at her oropharynx).    ? Esophagram on Thursday, November 20th?  As she is having a procedure 10/26/2024.    Pt agreeable to recommendations - defer to MD for definitive plan - no family present.  Pt endorses dysphagia prior to admission, which she attributes to her known esophageal dysfunction with suspected current exacerbation- as she admits to worsening dysphagia.  SLP Visit Diagnosis: Dysphagia, unspecified (R13.10) (known h/o paraesophageal hernia s/p repair x3)    Aspiration Risk  Moderate aspiration risk    Diet Recommendation Other (Comment) (sips of clears)    Compensations: Slow rate;Small sips/bites;Other (Comment) (stay upright after po)    Other  Recommendations Recommended Consults: Consider esophageal assessment Oral Care Recommendations: Oral care BID Caregiver Recommendations: Have oral suction available     Assistance Recommended at Discharge  tbd  Functional Status Assessment Patient has had a recent decline in their functional status and demonstrates the ability to make significant improvements in function in a reasonable and predictable amount of time.  Frequency and Duration min 1 x/week  1 week       Prognosis Prognosis for improved oropharyngeal function: Fair Barriers to Reach Goals: Time post onset      Swallow Study   General Date of Onset: 10/25/24 HPI: 89 year old with history of OSA, GERD, hypertension, CHF, chronic respiratory failure presenting with small bowel obstruction, acute on chronic hypoxic respiratory failure. Ileus is being managed conservatively by surgery. Hospital course complicated by atrial fibrillation with RVR, ongoing respiratory failure requiring BiPAP.   She was started on clear liquids and then was made NPO due to increased respiratory issues. Imaging of abdomen showed  Worsening small bowel dilatation, concerning  for distal small bowel  obstruction or ileus, without a definite transition zone identified.  2. Large periumbilical hernia containing a portion of the transverse colon without obstruction.  3. Sigmoid diverticulosis without evidence of diverticulitis.  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 She also reports her that she is not able to have the surgery repeated.  She also has h/o spontaneous reflux and esophageal dysmotility seen on esophagram in 2019.  She reports her dysphagia impacts her intake negatively PTA and states she tolerates liquids better than solids. Small bore tube attempted to be placed but pt was unable to tolerate and it was removed.  Prior brain imaging 01/2023 showed Small lacunar infarct or prominent perivascular space within the  right basal ganglia/internal capsule, new from the prior head CT of  04/30/2019 but otherwise age-indetermine.  Pt's chest imaging today showed  Bibasilar subsegmental consolidative changes may represent  atelectasis but concerning for infiltrate. Follow-up to resolution  recommended.  2. Trace left pleural effusion.  3. Moderate size hiatal hernia containing a portion of the stomach.  Swallow evaluation ordered. Type of Study: Bedside Swallow Evaluation Previous Swallow Assessment: see HPI Diet Prior to this Study: Clear liquid  diet (sips of clears) Temperature Spikes Noted: No Respiratory Status: Nasal cannula History of Recent Intubation: No Behavior/Cognition: Alert;Cooperative;Pleasant mood Oral Cavity Assessment: Other (comment)   (tongue is coated even after brushing)  ? ? Oral candidiasis- not on bilateral buccal region  Oral Care Completed by SLP: Yes Oral Cavity - Dentition: Other (Comment) (dentures are at home, few upper teeth) Self-Feeding Abilities: Needs assist Patient Positioning: Upright in bed Baseline Vocal Quality: Low vocal intensity Volitional Cough:  Other (Comment) (did not elicit due to pt's pain) Volitional Swallow: Unable to elicit    Oral/Motor/Sensory Function Overall Oral Motor/Sensory Function: Within functional limits   Ice Chips Ice chips: Within functional limits Presentation: Spoon Other Comments: Italian Ice via tsps and tiny Ice Chips   Thin Liquid Thin Liquid: Impaired Presentation: Cup;Self Fed;Straw Pharyngeal  Phase Impairments: Multiple swallows;Suspected delayed Swallow Other Comments: recurrent eructation noted with each bolus of clear liquids; pt senses backflow of material into pharynx -    Nectar Thick Nectar Thick Liquid: Impaired Presentation: Spoon Pharyngeal Phase Impairments: Multiple swallows;Suspected delayed Swallow   Honey Thick Honey Thick Liquid: Not tested   Puree Puree: Not tested   Solid     Solid: Not tested      Nicolas Emmie Caldron 10/25/2024,6:59 PM Madelin POUR, MS [facility] SLP Acute Rehab Services Office [phone]

## 2024-10-25 NOTE — Progress Notes (Signed)
 PROGRESS NOTE    Jennifer [name]  FMW:[id number] DOB: 1934/04/27 DOA: 10/20/2024 PCP: Cloria Annabella CROME, DO   Brief Narrative: 89 year old with past medical history significant for peripheral vascular disease, obstructive sleep apnea, on CPAP, depression, GERD, CAD with stable angina, history of PCI to LAD 2009, repeat cath 2014 due to accelerated angina and stent was patent.  Hypertension pulmonary hypertension, chronic hypoxic respiratory failure on 2 L of oxygen presented with abdominal pain, nausea.  CT with finding concerning for SBO.  Patient hospital course complicated by acute on chronic hypoxic respiratory failure, could be related to pneumonia aspiration versus acute diastolic heart failure.  Patient also developed A-fib with RVR.  She developed AKI.  Multiple consultants following.  Cardiology is considering right heart cath.  Plan to get CT chest to evaluate for pneumonia.  Assessment & Plan:   Principal Problem:   SBO (small bowel obstruction) (HCC)  1-SBO:  - Patient presented with abdominal pain, left side, associated with nausea. - CT abdomen and pelvis showed:  Moderate diffuse small bowel dilatation with fecalization, concerning for distal obstruction, likely at the terminal ileum; severe terminal ileal wall thickening with extension into the ileocecal valve, with differential including inflammatory etiology versus mass. -Initially treated with IV fluids -IV Protonix. -General Surgery consulted, and following.  -KUB: Diffuse gaseous distention of small bowel, decreased when comparing to scout film from CT scan 1 day prior. - She denies abdominal pain, notes patient had a small bowel movement yesterday after suppository.  Surgery recommend clear liquid diet if not on BiPAP.   Acute on Chronic hypoxic respiratory failure Pulmonary hypertension PNA, Aspiration PNA ? Acute on chronic diastolic heart failure - Continue with DuoNebs.  Neurologist discontinued Pulmicort and  Brovana. -11/16: Develops Dyspnea, Chest x ray with BL prominent interstitial opacities.  -Pulmonologist consulted, recommended initially diuresis.  - IV Solu-Medrol discontinued by pulmonologist -Chest x ray today, still showing BL infiltrates.  - Received IV Lasix , currently holding Lasix  due to worsening renal function  A fib RVR; Patient develops A fib RVR Initially treated with Cardizem drip.  Could not tolerate oral metoprolol due to soft blood pressure.  Started on IV amiodarone on 11/19. -On heparin drip.  AKI: Unclear etiology, worse after diuresis. Unclear if cardiorenal syndrome. Cardiology and nephrology following.  History of CAD s/p stent LAD 2009, history of angina: - Holding Ranexa  due to AKI  -As needed nitroglycerin.    Hypertension: - Hold lisinopril in the setting of NPO. - PRN  hydralazine Hold Norvasc    Hypothyroidism: -Continue with  synthroid.   History of CAD status post stent LAD  On  Zetia, crestor.   Depression: Continue with  lexapro, and Zoloft.    GERD on  PPI    Cannot rule out IVC thrombus by echo: Ultrasound order  Estimated body mass index is 36.53 kg/m as calculated from the following:   Height as of this encounter: 5' 2 (1.575 m).   Weight as of this encounter: 90.6 kg.   DVT prophylaxis: Lovenox Code Status: DNR Family Communication: Daughters  at bedside Disposition Plan:  Status is: Inpatient Remains inpatient appropriate because: management of SBO    Consultants:  Surgery   Procedures:  none  Antimicrobials:    Subjective: Patient seen this morning, she was feeling weak and tired.  She denies worsening shortness of breath.  She was uncomfortable with NG catheter that was placed Denies abdominal pain. Daughter is at bedside updated about medical condition   Objective: Vitals:  10/25/24 0800 10/25/24 0810 10/25/24 0823 10/25/24 0900  BP: (!) 120/50   (!) 129/96  Pulse: 74   85  Resp: (!) 27   19  Temp:   97.7 F (36.5 C)    TempSrc:  Oral    SpO2: 100%  98% 91%  Weight:      Height:        Intake/Output Summary (Last 24 hours) at 10/25/2024 9076 Last data filed at 10/25/2024 0720 Gross per 24 hour  Intake 923.13 ml  Output 210 ml  Net 713.13 ml   Filed Weights   10/21/24 1433 10/24/24 1555  Weight: 90.1 kg 90.6 kg    Examination:  General exam: NAD Respiratory system: Normal respiratory effort bilateral rhonchus no wheezing Cardiovascular system: S1, S2 regular rhythm or rate Gastrointestinal system: Bowel sounds present, soft nontender obese Central nervous system: Alert follows commands Extremities: no edema   Data Reviewed: I have personally reviewed following labs and imaging studies  CBC: Recent Labs  Lab 10/20/24 1353 10/20/24 1404 10/21/24 0459 10/23/24 0735 10/24/24 0505 10/25/24 0808  WBC 9.7  --  9.9 7.1 11.5* 10.0  NEUTROABS 8.5*  --   --   --   --   --   HGB 12.9 13.3 11.6* 12.0 11.9* 11.7*  HCT 40.7 39.0 37.3 38.6 37.5 36.5  MCV 90.6  --  91.9 90.8 89.9 89.9  PLT 303  --  306 273 278 306   Basic Metabolic Panel: Recent Labs  Lab 10/21/24 0459 10/22/24 0524 10/23/24 0735 10/24/24 0505 10/25/24 0808  NA 140 143 137 137 133*  K 4.3 4.4 4.6 4.1 4.5  CL 103 107 101 100 96*  CO2 29 28 26 23 22   GLUCOSE 116* 96 153* 140* 140*  BUN 19 15 15  28* 43*  CREATININE 1.29* 1.14* 1.29* 2.16* 3.29*  CALCIUM  9.2 8.9 9.2 8.7* 8.4*  MG  --  2.3 2.3  --   --   PHOS  --   --  2.2*  --   --    GFR: Estimated Creatinine Clearance: 12.4 mL/min (A) (by C-G formula based on SCr of 3.29 mg/dL (H)). Liver Function Tests: Recent Labs  Lab 10/20/24 1353 10/21/24 0459  AST 24 25  ALT 8 <5  ALKPHOS 126 99  BILITOT 0.5 0.6  PROT 8.4* 7.1  ALBUMIN 4.0 3.4*   Recent Labs  Lab 10/20/24 1353  LIPASE 28   No results for input(s): AMMONIA in the last 168 hours. Coagulation Profile: No results for input(s): INR, PROTIME in the last 168 hours. Cardiac  Enzymes: No results for input(s): CKTOTAL, CKMB, CKMBINDEX, TROPONINI in the last 168 hours. BNP (last 3 results) Recent Labs    10/23/24 0806  PROBNP 733.0*   HbA1C: No results for input(s): HGBA1C in the last 72 hours. CBG: No results for input(s): GLUCAP in the last 168 hours. Lipid Profile: No results for input(s): CHOL, HDL, LDLCALC, TRIG, CHOLHDL, LDLDIRECT in the last 72 hours. Thyroid Function Tests: Recent Labs    10/23/24 0735  TSH 2.380   Anemia Panel: No results for input(s): VITAMINB12, FOLATE, FERRITIN, TIBC, IRON, RETICCTPCT in the last 72 hours. Sepsis Labs: Recent Labs  Lab 10/23/24 0806  PROCALCITON 0.33    No results found for this or any previous visit (from the past 240 hours).       Radiology Studies: ECHOCARDIOGRAM COMPLETE Result Date: 10/24/2024    ECHOCARDIOGRAM REPORT   Patient Name:   Dustine Gao Date  of Exam: 10/24/2024 Medical Rec #:  [id number]       Height:       62.0 in Accession #:    [id number]      Weight:       198.6 lb Date of Birth:  25-Jul-1934       BSA:          1.906 m Patient Age:    89 years        BP:           116/74 mmHg Patient Gender: F               HR:           126 bpm. Exam Location:  Inpatient Procedure: 2D Echo, Cardiac Doppler and Color Doppler (Both Spectral and Color            Flow Doppler were utilized during procedure). Indications:    I50.40* Unspecified combined systolic (congestive) and diastolic                 (congestive) heart failure  History:        Patient has prior history of Echocardiogram examinations, most                 recent 02/25/2023. CAD, Pulmonary HTN, Signs/Symptoms:Shortness                 of Breath and Dyspnea; Risk Factors:Dyslipidemia.  Sonographer:    Ellouise Mose RDCS Referring Phys: [id number] [facility]  Sonographer Comments: Technically difficult study due to poor echo windows. Patient in rapid AFIB during exam. IMPRESSIONS  1. Left ventricular  ejection fraction, by estimation, is 65 to 70%. Left ventricular ejection fraction by 2D MOD biplane is 63.8 %. Left ventricular ejection fraction by PLAX is 64 %. The left ventricle has normal function. The left ventricle has no regional wall motion abnormalities. Left ventricular diastolic parameters are indeterminate.  2. Right ventricular systolic function is normal. The right ventricular size is normal. There is normal pulmonary artery systolic pressure. The estimated right ventricular systolic pressure is 35.7 mmHg.  3. The mitral valve is normal in structure. Trivial mitral valve regurgitation. No evidence of mitral stenosis.  4. The aortic valve is tricuspid. Aortic valve regurgitation is not visualized. No aortic stenosis is present.  5. Cannot rule out thrombus in IVC. The inferior vena cava is dilated in size with >50% respiratory variability, suggesting right atrial pressure of 8 mmHg. FINDINGS  Left Ventricle: Mild intercavitary gradient of . Left ventricular ejection fraction, by estimation, is 65 to 70%. Left ventricular ejection fraction by PLAX is 64 %. Left ventricular ejection fraction by 2D MOD biplane is 63.8 %. The left ventricle has normal function. The left ventricle has no regional wall motion abnormalities. The left ventricular internal cavity size was normal in size. There is no left ventricular hypertrophy. Left ventricular diastolic parameters are indeterminate. Normal left ventricular filling pressure. Right Ventricle: The right ventricular size is normal. No increase in right ventricular wall thickness. Right ventricular systolic function is normal. There is normal pulmonary artery systolic pressure. The tricuspid regurgitant velocity is 2.63 m/s, and  with an assumed right atrial pressure of 8 mmHg, the estimated right ventricular systolic pressure is 35.7 mmHg. Left Atrium: Left atrial size was normal in size. Right Atrium: Right atrial size was normal in size. Pericardium:  There is no evidence of pericardial effusion. Mitral Valve: The mitral valve is normal in structure. Trivial mitral valve  regurgitation. No evidence of mitral valve stenosis. Tricuspid Valve: The tricuspid valve is normal in structure. Tricuspid valve regurgitation is mild. Aortic Valve: The aortic valve is tricuspid. Aortic valve regurgitation is not visualized. No aortic stenosis is present. Pulmonic Valve: The pulmonic valve was normal in structure. Pulmonic valve regurgitation is trivial. No evidence of pulmonic stenosis. Aorta: The aortic root and ascending aorta are structurally normal, with no evidence of dilitation. Venous: Cannot rule out thrombus in IVC. The inferior vena cava is dilated in size with greater than 50% respiratory variability, suggesting right atrial pressure of 8 mmHg. IAS/Shunts: No atrial level shunt detected by color flow Doppler.  LEFT VENTRICLE PLAX 2D                        Biplane EF (MOD) LV EF:         Left            LV Biplane EF:   Left                ventricular                      ventricular                ejection                         ejection                fraction by                      fraction by                PLAX is 64                       2D MOD                %.                               biplane is LVIDd:         2.40 cm                          63.8 %. LVIDs:         1.60 cm LV PW:         0.90 cm LV IVS:        0.90 cm LVOT diam:     1.60 cm LV SV:         32 LV SV Index:   17 LVOT Area:     2.01 cm  LV Volumes (MOD) LV vol d, MOD    43.7 ml A2C: LV vol d, MOD    31.2 ml A4C: LV vol s, MOD    13.0 ml A2C: LV vol s, MOD    11.1 ml A4C: LV SV MOD A2C:   30.7 ml LV SV MOD A4C:   31.2 ml LV SV MOD BP:    23.9 ml RIGHT VENTRICLE             IVC RV S prime:     12.90 cm/s  IVC diam: 2.10 cm TAPSE (M-mode): 1.3 cm LEFT ATRIUM  Index        RIGHT ATRIUM           Index LA diam:        1.60 cm 0.84 cm/m   RA Area:     10.60 cm LA Vol (A2C):   17.2  ml 9.02 ml/m   RA Volume:   19.80 ml  10.39 ml/m LA Vol (A4C):   31.1 ml 16.32 ml/m LA Biplane Vol: 24.5 ml 12.85 ml/m  AORTIC VALVE LVOT Vmax:   113.00 cm/s LVOT Vmean:  76.300 cm/s LVOT VTI:    0.160 m  AORTA Ao Asc diam: 3.25 cm MITRAL VALVE               TRICUSPID VALVE MV Area (PHT): 3.13 cm    TR Peak grad:   27.7 mmHg MV Decel Time: 242 msec    TR Vmax:        263.00 cm/s MV E velocity: 94.50 cm/s                            SHUNTS                            Systemic VTI:  0.16 m                            Systemic Diam: 1.60 cm Morene Brownie Electronically signed by Morene Brownie Signature Date/Time: 10/24/2024/1:26:41 PM    Final    DG Chest Port 1 View Result Date: 10/24/2024 EXAM: 1 VIEW(S) XRAY OF THE CHEST 10/24/2024 06:18:25 AM COMPARISON: 10/23/2024. CLINICAL HISTORY: Dyspnea. FINDINGS: LUNGS AND PLEURA: Low lung volumes are noted. Persistent bilateral interstitial prominence is present. Trace right pleural effusion is seen. Asymmetric elevation of the left hemidiaphragm is unchanged. A left lower lobe bandlike opacity is present, which likely represents scar or subsegmental atelectasis. No pneumothorax. HEART AND MEDIASTINUM: Aortic atherosclerosis is present. A retrocardiac lucency is consistent with a hiatal hernia. No acute abnormality of the cardiac and mediastinal silhouettes. BONES AND SOFT TISSUES: Partially visualized lumbar spinal fusion hardware is noted. No acute osseous abnormality. IMPRESSION: 1. Persistent bilateral interstitial prominence with trace right pleural effusion. Electronically signed by: Waddell Calk MD 10/24/2024 06:39 AM EST RP Workstation: [id number]        Scheduled Meds:  atorvastatin   40 mg Oral Daily   Chlorhexidine  Gluconate Cloth  6 each Topical Daily   diatrizoate  meglumine -sodium  90 mL Oral Once   ezetimibe   10 mg Oral Daily   feeding supplement  1 Container Oral TID BM   ipratropium  0.5 mg Nebulization TID   levothyroxine   75 mcg Oral  QAC breakfast   metoprolol  tartrate  25 mg Oral Q6H   pantoprazole  (PROTONIX ) IV  40 mg Intravenous Q12H   ranolazine   500 mg Oral BID   sertraline   50 mg Oral Daily   Continuous Infusions:  sodium chloride  Stopped (10/25/24 0013)   amiodarone  30 mg/hr (10/25/24 0506)   azithromycin  Stopped (10/24/24 1409)   cefTRIAXone  (ROCEPHIN )  IV Stopped (10/24/24 1051)   diltiazem  (CARDIZEM ) infusion Stopped (10/24/24 1820)   heparin  1,000 Units/hr (10/25/24 0506)   norepinephrine  (LEVOPHED ) Adult infusion       LOS: 5 days    Time spent: 35 Minutes    Turner Baillie A Francis Doenges, MD [facility]   If 7PM-7AM, please contact night-coverage  [email or web address]  10/25/2024, 9:23 AM

## 2024-10-25 NOTE — Progress Notes (Signed)
 Progress Note     Subjective: Not really having abdominal pain, feels a little bloated, no nausea.  Diet moved back to NPO yesterday for bipap.  Says she had a BM with her suppository yesterday.   Objective: Vital signs in last 24 hours: Temp:  [97.3 F (36.3 C)-98.1 F (36.7 C)] 97.7 F (36.5 C) (11/18 0810) Pulse Rate:  [74-136] 85 (11/18 0900) Resp:  [19-42] 19 (11/18 0900) BP: (86-129)/(50-99) 129/96 (11/18 0900) SpO2:  [88 %-100 %] 91 % (11/18 0900) FiO2 (%):  [28 %] 28 % (11/17 2200) Weight:  [90.6 kg] 90.6 kg (11/17 1555) Last BM Date : 10/19/24  Intake/Output from previous day: 11/17 0701 - 11/18 0700 In: 923.1 [I.V.:573.1; IV Piggyback:350] Out: 60 [Urine:60] Intake/Output this shift: Total I/O In: -  Out: 150 [Urine:150]  PE: General: Pleasant female who is laying in bed in NAD on Refton Abd: Soft with min distention in upper abdomen where her known hernia is; no TTP. Inicisional hernia is soft. No rebound tenderness or guarding.  Psych: A&Ox3 with an appropriate affect.    Lab Results:  Recent Labs    10/24/24 0505 10/25/24 0808  WBC 11.5* 10.0  HGB 11.9* 11.7*  HCT 37.5 36.5  PLT 278 306   BMET Recent Labs    10/24/24 0505 10/25/24 0808  NA 137 133*  K 4.1 4.5  CL 100 96*  CO2 23 22  GLUCOSE 140* 140*  BUN 28* 43*  CREATININE 2.16* 3.29*  CALCIUM 8.7* 8.4*   PT/INR No results for input(s): LABPROT, INR in the last 72 hours. CMP     Component Value Date/Time   NA 133 (L) 10/25/2024 0808   NA 140 09/10/2020 1001   K 4.5 10/25/2024 0808   CL 96 (L) 10/25/2024 0808   CO2 22 10/25/2024 0808   GLUCOSE 140 (H) 10/25/2024 0808   BUN 43 (H) 10/25/2024 0808   BUN 26 09/10/2020 1001   CREATININE 3.29 (H) 10/25/2024 0808   CALCIUM 8.4 (L) 10/25/2024 0808   PROT 7.1 10/21/2024 0459   PROT 6.7 09/02/2019 0903   ALBUMIN 3.4 (L) 10/21/2024 0459   ALBUMIN 3.9 09/02/2019 0903   AST 25 10/21/2024 0459   ALT <5 10/21/2024 0459   ALKPHOS 99  10/21/2024 0459   BILITOT 0.6 10/21/2024 0459   BILITOT 0.4 09/02/2019 0903   GFRNONAA 13 (L) 10/25/2024 0808   GFRAA 52 (L) 09/10/2020 1001   Lipase     Component Value Date/Time   LIPASE 28 10/20/2024 1353       Studies/Results: ECHOCARDIOGRAM COMPLETE Result Date: 10/24/2024    ECHOCARDIOGRAM REPORT   Patient Name:   ROBERT SUNGA Date of Exam: 10/24/2024 Medical Rec #:  [id number]       Height:       62.0 in Accession #:    [id number]      Weight:       198.6 lb Date of Birth:  1934-10-06       BSA:          1.906 m Patient Age:    89 years        BP:           116/74 mmHg Patient Gender: F               HR:           126 bpm. Exam Location:  Inpatient Procedure: 2D Echo, Cardiac Doppler and Color Doppler (Both Spectral and Color  Flow Doppler were utilized during procedure). Indications:    I50.40* Unspecified combined systolic (congestive) and diastolic                 (congestive) heart failure  History:        Patient has prior history of Echocardiogram examinations, most                 recent 02/25/2023. CAD, Pulmonary HTN, Signs/Symptoms:Shortness                 of Breath and Dyspnea; Risk Factors:Dyslipidemia.  Sonographer:    Ellouise Mose RDCS Referring Phys: [id number] [facility]  Sonographer Comments: Technically difficult study due to poor echo windows. Patient in rapid AFIB during exam. IMPRESSIONS  1. Left ventricular ejection fraction, by estimation, is 65 to 70%. Left ventricular ejection fraction by 2D MOD biplane is 63.8 %. Left ventricular ejection fraction by PLAX is 64 %. The left ventricle has normal function. The left ventricle has no regional wall motion abnormalities. Left ventricular diastolic parameters are indeterminate.  2. Right ventricular systolic function is normal. The right ventricular size is normal. There is normal pulmonary artery systolic pressure. The estimated right ventricular systolic pressure is 35.7 mmHg.  3. The mitral valve is normal in  structure. Trivial mitral valve regurgitation. No evidence of mitral stenosis.  4. The aortic valve is tricuspid. Aortic valve regurgitation is not visualized. No aortic stenosis is present.  5. Cannot rule out thrombus in IVC. The inferior vena cava is dilated in size with >50% respiratory variability, suggesting right atrial pressure of 8 mmHg. FINDINGS  Left Ventricle: Mild intercavitary gradient of . Left ventricular ejection fraction, by estimation, is 65 to 70%. Left ventricular ejection fraction by PLAX is 64 %. Left ventricular ejection fraction by 2D MOD biplane is 63.8 %. The left ventricle has normal function. The left ventricle has no regional wall motion abnormalities. The left ventricular internal cavity size was normal in size. There is no left ventricular hypertrophy. Left ventricular diastolic parameters are indeterminate. Normal left ventricular filling pressure. Right Ventricle: The right ventricular size is normal. No increase in right ventricular wall thickness. Right ventricular systolic function is normal. There is normal pulmonary artery systolic pressure. The tricuspid regurgitant velocity is 2.63 m/s, and  with an assumed right atrial pressure of 8 mmHg, the estimated right ventricular systolic pressure is 35.7 mmHg. Left Atrium: Left atrial size was normal in size. Right Atrium: Right atrial size was normal in size. Pericardium: There is no evidence of pericardial effusion. Mitral Valve: The mitral valve is normal in structure. Trivial mitral valve regurgitation. No evidence of mitral valve stenosis. Tricuspid Valve: The tricuspid valve is normal in structure. Tricuspid valve regurgitation is mild. Aortic Valve: The aortic valve is tricuspid. Aortic valve regurgitation is not visualized. No aortic stenosis is present. Pulmonic Valve: The pulmonic valve was normal in structure. Pulmonic valve regurgitation is trivial. No evidence of pulmonic stenosis. Aorta: The aortic root and  ascending aorta are structurally normal, with no evidence of dilitation. Venous: Cannot rule out thrombus in IVC. The inferior vena cava is dilated in size with greater than 50% respiratory variability, suggesting right atrial pressure of 8 mmHg. IAS/Shunts: No atrial level shunt detected by color flow Doppler.  LEFT VENTRICLE PLAX 2D                        Biplane EF (MOD) LV EF:  Left            LV Biplane EF:   Left                ventricular                      ventricular                ejection                         ejection                fraction by                      fraction by                PLAX is 64                       2D MOD                %.                               biplane is LVIDd:         2.40 cm                          63.8 %. LVIDs:         1.60 cm LV PW:         0.90 cm LV IVS:        0.90 cm LVOT diam:     1.60 cm LV SV:         32 LV SV Index:   17 LVOT Area:     2.01 cm  LV Volumes (MOD) LV vol d, MOD    43.7 ml A2C: LV vol d, MOD    31.2 ml A4C: LV vol s, MOD    13.0 ml A2C: LV vol s, MOD    11.1 ml A4C: LV SV MOD A2C:   30.7 ml LV SV MOD A4C:   31.2 ml LV SV MOD BP:    23.9 ml RIGHT VENTRICLE             IVC RV S prime:     12.90 cm/s  IVC diam: 2.10 cm TAPSE (M-mode): 1.3 cm LEFT ATRIUM             Index        RIGHT ATRIUM           Index LA diam:        1.60 cm 0.84 cm/m   RA Area:     10.60 cm LA Vol (A2C):   17.2 ml 9.02 ml/m   RA Volume:   19.80 ml  10.39 ml/m LA Vol (A4C):   31.1 ml 16.32 ml/m LA Biplane Vol: 24.5 ml 12.85 ml/m  AORTIC VALVE LVOT Vmax:   113.00 cm/s LVOT Vmean:  76.300 cm/s LVOT VTI:    0.160 m  AORTA Ao Asc diam: 3.25 cm MITRAL VALVE               TRICUSPID VALVE MV Area (PHT): 3.13 cm    TR Peak grad:   27.7 mmHg MV Decel Time: 242 msec    TR Vmax:  263.00 cm/s MV E velocity: 94.50 cm/s                            SHUNTS                            Systemic VTI:  0.16 m                            Systemic Diam: 1.60 cm Morene Brownie Electronically signed by Morene Brownie Signature Date/Time: 10/24/2024/1:26:41 PM    Final    DG Chest Port 1 View Result Date: 10/24/2024 EXAM: 1 VIEW(S) XRAY OF THE CHEST 10/24/2024 06:18:25 AM COMPARISON: 10/23/2024. CLINICAL HISTORY: Dyspnea. FINDINGS: LUNGS AND PLEURA: Low lung volumes are noted. Persistent bilateral interstitial prominence is present. Trace right pleural effusion is seen. Asymmetric elevation of the left hemidiaphragm is unchanged. A left lower lobe bandlike opacity is present, which likely represents scar or subsegmental atelectasis. No pneumothorax. HEART AND MEDIASTINUM: Aortic atherosclerosis is present. A retrocardiac lucency is consistent with a hiatal hernia. No acute abnormality of the cardiac and mediastinal silhouettes. BONES AND SOFT TISSUES: Partially visualized lumbar spinal fusion hardware is noted. No acute osseous abnormality. IMPRESSION: 1. Persistent bilateral interstitial prominence with trace right pleural effusion. Electronically signed by: Waddell Calk MD 10/24/2024 06:39 AM EST RP Workstation: [id number]    Anti-infectives: Anti-infectives (From admission, onward)    Start     Dose/Rate Route Frequency Ordered Stop   10/23/24 1000  cefTRIAXone (ROCEPHIN) 2 g in sodium chloride  0.9 % 100 mL IVPB        2 g 200 mL/hr over 30 Minutes Intravenous Every 24 hours 10/23/24 0804     10/23/24 1000  azithromycin (ZITHROMAX) 500 mg in sodium chloride  0.9 % 250 mL IVPB        500 mg 250 mL/hr over 60 Minutes Intravenous Every 24 hours 10/23/24 0804          Assessment/Plan Roseline Dauphinee is an 89 y.o. female with HTN, HLD, hypothyroidism, depressoin, PAD, CAD here with possible partial small bowel obstruction versus ileus   -CT scan with mild to moderate thickening of the terminal ileum on CT scan with mucosal enhancement. Could have some sort of terminal ileitis and/or even gastroenteritis. Large periumbilical hernia containing portion of the  transverse colon without obstruction. Sigmoid diverticulosis without inflammation. -repeat x-ray still with some dilated bowel today, appears more like ileus than obstruction, but given minimal bowel function, will have her drink gastrografin and do the SBO protocol orally to prove no obstructive process  -can likely have CLD as long as she stays off BiPap -Will continue to follow, but no surgical needs at this time.   FEN: NPO due to needs for bipap yesterday, can likely try some CLD, but otherwise proceeding with SBO protocol, IVF per primary team VTE: Lovenox ID: None currently.    LOS: 5 days   I reviewed nursing notes, hospitalist notes, last 24 h vitals and pain scores, last 48 h intake and output, last 24 h labs and trends, and last 24 h imaging results.  Burnard FORBES Banter, [facility] 10/25/2024, 9:26 AM Please see Amion for pager number during day hours 7:00am-4:30pm

## 2024-10-25 NOTE — Progress Notes (Signed)
   10/24/24 2200  BiPAP/CPAP/SIPAP  BiPAP/CPAP/SIPAP Pt Type Adult  BiPAP/CPAP/SIPAP V60 (cpap)  Mask Type Full face mask  Dentures removed? Not applicable  Mask Size Medium  Respiratory Rate 20 breaths/min  EPAP 10 cmH2O  FiO2 (%) 28 %  Minute Ventilation 10.5  Leak 6  Peak Inspiratory Pressure (PIP) 11  Tidal Volume (Vt) 517  Patient Home Machine No  Patient Home Mask No  Patient Home Tubing No  Auto Titrate No  Press High Alarm 25 cmH2O  Press Low Alarm 5 cmH2O  Device Plugged into RED Power Outlet Yes

## 2024-10-25 NOTE — Plan of Care (Signed)
  Problem: Pain Managment: Goal: General experience of comfort will improve and/or be controlled Outcome: Progressing   Problem: Clinical Measurements: Goal: Respiratory complications will improve Outcome: Not Progressing Goal: Cardiovascular complication will be avoided Outcome: Not Progressing   Problem: Nutrition: Goal: Adequate nutrition will be maintained Outcome: Not Progressing   Problem: Elimination: Goal: Will not experience complications related to urinary retention Outcome: Not Progressing

## 2024-10-25 NOTE — Progress Notes (Addendum)
 Progress Note  Patient Name: Jennifer [name] Date of Encounter: 10/25/2024  [facility] Cardiologist: Jennifer Bruckner, MD   Interval Summary   Denies any chest pain or shortness of breath.  Vital Signs Vitals:   10/25/24 0715 10/25/24 0800 10/25/24 0810 10/25/24 0823  BP: 107/80 (!) 120/50    Pulse: 75 74    Resp: (!) 27 (!) 27    Temp:   97.7 F (36.5 C)   TempSrc:   Oral   SpO2: 99% 100%  98%  Weight:      Height:        Intake/Output Summary (Last 24 hours) at 10/25/2024 0858 Last data filed at 10/25/2024 0720 Gross per 24 hour  Intake 923.13 ml  Output 210 ml  Net 713.13 ml      10/24/2024    3:55 PM 10/21/2024    2:33 PM 08/01/2024    1:11 PM  Last 3 Weights  Weight (lbs) 199 lb 11.8 oz 198 lb 10.2 oz 189 lb  Weight (kg) 90.6 kg 90.1 kg 85.73 kg      Telemetry/ECG  Was in A-fib with heart rates in the 100s to 110s but converted to sinus rhythm at about 7:14 AM.  After converting to sinus rhythm heart rates have been in the 70s to 80s.- Personally Reviewed  Physical Exam  GEN: No acute distress.   Neck: No JVD Cardiac: RRR, no murmurs, rubs, or gallops.  Respiratory: Clear to auscultation bilaterally. GI: Abdomen distended and tender to palpation. Jennifer: No edema  Assessment & Plan  Jennifer [name] is a 89 yoF with Hx of CAD s/p remote LAD PCI, HFpEF, PAD, chronic respiratory failure on 2L who presented with partial SBO vs. Ileus and was found to be in Afib briefly last week and started on dilt gtt. She was in NSR with high PAC burden yesterday and went to Afib with RVR this AM (11/17) requiring higher doses of Dilt.   A-fib with RVR CHA2DS2-VASc Score = 5 [CHF History: 1, HTN History: 0, Diabetes History: 0, Stroke History: 0, Vascular Disease History: 1, Age Score: 2, Gender Score: 1].  Therefore, the patient's annual risk of stroke is 7.2 %.    Was initially placed on IV Cardizem.  In the morning on 11/17 was started on metoprolol tartrate 25  mg every 6 hours.  Blood pressures over the day continue to remain soft and heart rates continue to to remain elevated.  Because of this the patient was started on IV amiodarone. This morning at about 7:15 am the patient converted to sinus rhythm.  Continue IV amiodarone. May consider transitioning to oral amiodarone.  Continue IV heparin. May transition to Eliquis 5 mg twice daily when can take p.o. medications. Continue metoprolol.   HFpEF Echocardiogram showed normal LVEF of 65 to 70%, no RWMA, a 15 mm intracavitary gradient on the LV, normal RV systolic function, normal pulmonary artery pressure, and grossly normal valve function. Chest x-ray yesterday showed Persistent bilateral interstitial prominence with trace right pleural effusion. Appears somewhat euvolemic on exam. Weight at office visit on 07/2024 was 85.7 kgs.  Most recent weight this hospitalization was 90.6 kg's May consider ordering 40mg  IV lasix . Order RHC to assess volume status. Order abdominal ultrasound.   CAD PAD Hyperlipidemia Continue atorvastatin, and Zetia Stop Ranexa  due to worsening renal function.   AKI Creatinine has worsened to 3.29 today. Did receive contrast with imaging on 10/20/24. Nephrology has been consulted.   For questions or updates, please contact  [facility] Please consult [email or web address] for contact info under        Signed, Jennifer Limes, PA-C

## 2024-10-25 NOTE — Progress Notes (Addendum)
 PHARMACY - ANTICOAGULATION CONSULT NOTE  Pharmacy Consult for IV heparin Indication: Afib (while unable to take PO Eliquis)  No Known Allergies  Patient Measurements: Height: 5' 2 (157.5 cm) Weight: 90.6 kg (199 lb 11.8 oz) IBW/kg (Calculated) : 50.1 HEPARIN DW (KG): 71  Vital Signs: Temp: 97.7 F (36.5 C) (11/18 0810) Temp Source: Oral (11/18 0810) BP: 129/96 (11/18 0900) Pulse Rate: 85 (11/18 0900)  Labs: Recent Labs    10/23/24 0735 10/24/24 0505 10/25/24 0808  HGB 12.0 11.9* 11.7*  HCT 38.6 37.5 36.5  PLT 273 278 306  APTT  --   --  45*  HEPARINUNFRC  --   --  0.76*  CREATININE 1.29* 2.16* 3.29*    Estimated Creatinine Clearance: 12.4 mL/min (A) (by C-G formula based on SCr of 3.29 mg/dL (H)).  Medical History: Past Medical History:  Diagnosis Date   Ambulates with cane    straight   At risk for falls    CAD (coronary artery disease)    Cataracts, bilateral    GERD (gastroesophageal reflux disease)    Hypercholesterolemia    Hypertension    Hypothyroidism    Major depression    Musculoskeletal back pain    CHRONIC LEFT-SIDED DISCOMFORT   Obstructive sleep apnea on CPAP    uses CPAP nightly   Osteopenia    PVD (peripheral vascular disease)    Stable angina    SVD (spontaneous vaginal delivery)    x 2   Vitamin D deficiency    Wears glasses    Wears partial dentures    upper    Medications: No anticoagulants PTA -Eliquis (new start inpatient) for afib  Assessment: 75 yoF with PMH HTN, CHF, chronic respiratory failure admitted 11/13 for SBO. Started on Eliquis on 11/17 for new Afib w/ RVR. Pt subsequently transferred to SDU on 11/17 for worsening respiratory failure. Pharmacy consulted to dose heparin while apixaban on hold.   Pt received one dose of apixaban prior to initiating heparin drip. DOAC can falsely elevated heparin level.   Today, 10/25/2024: aPTT = 45 seconds is subtherapeutic on heparin infusion of 1000 units/hr Heparin level =  0.76 is falsely elevated due to DOAC CBC: Hgb low but stable, Plt WNL No bleeding or complications reported Worsening renal function  Goal of Therapy: Heparin level 0.3-0.7 units/ml aPTT 66-102 seconds Monitor platelets by anticoagulation protocol: Yes  Plan: Heparin bolus of 2000 units IV once Increase rate of heparin infusion to 1200 units/hr Check 8 hour aPTT CBC, heparin level, aPTT daily Once heparin level and aPTT correlate, can monitor using heparin level only Monitor for signs of bleeding  Ronal CHRISTELLA Rav, PharmD 10/25/24 9:31 AM

## 2024-10-25 NOTE — Progress Notes (Signed)
 Pharmacy: Re- heparin  Patient is an 89 y.o F who presented to the ED on 10/20/24 with c/o abdominal pain and was found to have suspected SBO. She was started on Eliquis on 10/24/24 for new onset afib and then transferred to SDU for respiratory failure.  Anticoagulant changed to heparin drip on 10/25/24.  - Last dose of Eliquis given to patient on 10/24/24 at 12:31 PM.   - aPTT level collected at 6:26pm was elevated at 147 secs, but not certain if level was drawn at the correct site. - Repeat aPTT level collected at 9:21 PM is >200 sec.  RN verified that lab was drawn from L arm and heparin infusing in R arm.   -No bleeding or other concerns reported by RN  Goal of Therapy: Heparin level 0.3-0.7 units/ml aPTT 66-102 seconds Monitor platelets by anticoagulation protocol: Yes  Plan: - Hold heparin x2 hours then resume at lower rate of 1000 units/hr - Check aptt/heparin level at 1100 -Monitor of s/sx of bleeding  Rosaline Millet, PharmD, BCPS 10/26/2024@1 :11 AM

## 2024-10-25 NOTE — Progress Notes (Signed)
 Afternoon rounds: Stable throughout today.  Has been mostly off of BiPAP but looks uncomfortable at times Plan for right heart cath tomorrow per cardiology. We tried diuresis which made AKI worse so holding on further diuresis at this time Currently receiving CT chest without contrast to evaluate her bilateral opacities Switched Rocephin to Unasyn for aspiration component Also have ordered abdominal vascular ultrasound to assess for IVC thrombus as this was concern on TTE finding  Will continue following from pulmonary perspective  Tinnie FORBES Furth, PA-C [facility] 10/25/24 2:47 PM  Please see [email or web address] for pager details.  From 7A-7P if no response, please call [phone] After hours, please call ELink [phone]

## 2024-10-26 ENCOUNTER — Inpatient Hospital Stay (HOSPITAL_COMMUNITY): Payer: Medicare (Managed Care)

## 2024-10-26 ENCOUNTER — Encounter (HOSPITAL_COMMUNITY): Payer: Self-pay | Admitting: Cardiology

## 2024-10-26 ENCOUNTER — Encounter (HOSPITAL_COMMUNITY)
Admission: EM | Disposition: A | Discharge status: Hospice | Payer: Self-pay | Source: Home / Self Care | Attending: Internal Medicine

## 2024-10-26 DIAGNOSIS — I4891 Unspecified atrial fibrillation: Secondary | ICD-10-CM | POA: Diagnosis not present

## 2024-10-26 DIAGNOSIS — I2510 Atherosclerotic heart disease of native coronary artery without angina pectoris: Secondary | ICD-10-CM | POA: Diagnosis not present

## 2024-10-26 DIAGNOSIS — K429 Umbilical hernia without obstruction or gangrene: Secondary | ICD-10-CM | POA: Diagnosis not present

## 2024-10-26 DIAGNOSIS — I50810 Right heart failure, unspecified: Secondary | ICD-10-CM

## 2024-10-26 DIAGNOSIS — N179 Acute kidney failure, unspecified: Secondary | ICD-10-CM

## 2024-10-26 DIAGNOSIS — I509 Heart failure, unspecified: Secondary | ICD-10-CM | POA: Diagnosis not present

## 2024-10-26 DIAGNOSIS — I5030 Unspecified diastolic (congestive) heart failure: Secondary | ICD-10-CM | POA: Diagnosis not present

## 2024-10-26 DIAGNOSIS — I480 Paroxysmal atrial fibrillation: Secondary | ICD-10-CM

## 2024-10-26 DIAGNOSIS — Z66 Do not resuscitate: Secondary | ICD-10-CM

## 2024-10-26 DIAGNOSIS — Z79899 Other long term (current) drug therapy: Secondary | ICD-10-CM

## 2024-10-26 DIAGNOSIS — Z7189 Other specified counseling: Secondary | ICD-10-CM

## 2024-10-26 DIAGNOSIS — Z711 Person with feared health complaint in whom no diagnosis is made: Secondary | ICD-10-CM

## 2024-10-26 DIAGNOSIS — Z515 Encounter for palliative care: Secondary | ICD-10-CM

## 2024-10-26 DIAGNOSIS — K56609 Unspecified intestinal obstruction, unspecified as to partial versus complete obstruction: Secondary | ICD-10-CM | POA: Diagnosis not present

## 2024-10-26 DIAGNOSIS — R0602 Shortness of breath: Secondary | ICD-10-CM

## 2024-10-26 DIAGNOSIS — I10 Essential (primary) hypertension: Secondary | ICD-10-CM | POA: Diagnosis not present

## 2024-10-26 HISTORY — PX: RIGHT HEART CATH: CATH118263

## 2024-10-26 LAB — POCT I-STAT EG7
Acid-base deficit: 5 mmol/L — ABNORMAL HIGH (ref 0.0–2.0)
Acid-base deficit: 6 mmol/L — ABNORMAL HIGH (ref 0.0–2.0)
Bicarbonate: 22.6 mmol/L (ref 20.0–28.0)
Bicarbonate: 23.1 mmol/L (ref 20.0–28.0)
Calcium, Ion: 0.97 mmol/L — ABNORMAL LOW (ref 1.15–1.40)
Calcium, Ion: 0.97 mmol/L — ABNORMAL LOW (ref 1.15–1.40)
HCT: 34 % — ABNORMAL LOW (ref 36.0–46.0)
HCT: 34 % — ABNORMAL LOW (ref 36.0–46.0)
Hemoglobin: 11.6 g/dL — ABNORMAL LOW (ref 12.0–15.0)
Hemoglobin: 11.6 g/dL — ABNORMAL LOW (ref 12.0–15.0)
O2 Saturation: 67 %
O2 Saturation: 69 %
Potassium: 4.1 mmol/L (ref 3.5–5.1)
Potassium: 4.1 mmol/L (ref 3.5–5.1)
Sodium: 134 mmol/L — ABNORMAL LOW (ref 135–145)
Sodium: 134 mmol/L — ABNORMAL LOW (ref 135–145)
TCO2: 24 mmol/L (ref 22–32)
TCO2: 25 mmol/L (ref 22–32)
pCO2, Ven: 57.5 mmHg (ref 44–60)
pCO2, Ven: 58.8 mmHg (ref 44–60)
pH, Ven: 7.202 — ABNORMAL LOW (ref 7.25–7.43)
pH, Ven: 7.203 — ABNORMAL LOW (ref 7.25–7.43)
pO2, Ven: 43 mmHg (ref 32–45)
pO2, Ven: 45 mmHg (ref 32–45)

## 2024-10-26 LAB — CBC
HCT: 38.6 % (ref 36.0–46.0)
Hemoglobin: 12 g/dL (ref 12.0–15.0)
MCH: 28.3 pg (ref 26.0–34.0)
MCHC: 31.1 g/dL (ref 30.0–36.0)
MCV: 91 fL (ref 80.0–100.0)
Platelets: 298 K/uL (ref 150–400)
RBC: 4.24 MIL/uL (ref 3.87–5.11)
RDW: 14.2 % (ref 11.5–15.5)
WBC: 8.9 K/uL (ref 4.0–10.5)
nRBC: 0.2 % (ref 0.0–0.2)

## 2024-10-26 LAB — BASIC METABOLIC PANEL WITH GFR
Anion gap: 18 — ABNORMAL HIGH (ref 5–15)
BUN: 53 mg/dL — ABNORMAL HIGH (ref 8–23)
CO2: 19 mmol/L — ABNORMAL LOW (ref 22–32)
Calcium: 7.7 mg/dL — ABNORMAL LOW (ref 8.9–10.3)
Chloride: 95 mmol/L — ABNORMAL LOW (ref 98–111)
Creatinine, Ser: 4.02 mg/dL — ABNORMAL HIGH (ref 0.44–1.00)
GFR, Estimated: 10 mL/min — ABNORMAL LOW (ref >60)
Glucose, Bld: 108 mg/dL — ABNORMAL HIGH (ref 70–99)
Potassium: 4.4 mmol/L (ref 3.5–5.1)
Sodium: 132 mmol/L — ABNORMAL LOW (ref 135–145)

## 2024-10-26 LAB — APTT: aPTT: >200 s (ref 24–36)

## 2024-10-26 SURGERY — RIGHT HEART CATH

## 2024-10-26 MED ORDER — HALOPERIDOL LACTATE 2 MG/ML PO CONC
0.5000 mg | ORAL | Status: DC | PRN
Start: 2024-10-26 — End: 2024-10-27

## 2024-10-26 MED ORDER — AMIODARONE HCL 200 MG PO TABS
400.0000 mg | ORAL_TABLET | Freq: Two times a day (BID) | ORAL | Status: DC
  Filled 2024-10-26: qty 2

## 2024-10-26 MED ORDER — HEPARIN (PORCINE) IN NACL 1000-0.9 UT/500ML-% IV SOLN
INTRAVENOUS | Status: DC | PRN
  Administered 2024-10-26: 500 mL

## 2024-10-26 MED ORDER — LORAZEPAM 2 MG/ML IJ SOLN: 0.5000 mg | INTRAMUSCULAR | Status: DC | PRN

## 2024-10-26 MED ORDER — HYDROMORPHONE HCL 1 MG/ML IJ SOLN: 0.5000 mg | INTRAMUSCULAR | Status: DC | PRN

## 2024-10-26 MED ORDER — ASPIRIN 81 MG PO CHEW: 81.0000 mg | CHEWABLE_TABLET | ORAL | Status: DC

## 2024-10-26 MED ORDER — HEPARIN (PORCINE) 25000 UT/250ML-% IV SOLN
1000.0000 [IU]/h | INTRAVENOUS | Status: DC
  Administered 2024-10-26: 1000 [IU]/h via INTRAVENOUS
  Filled 2024-10-26: qty 250

## 2024-10-26 MED ORDER — HYDROMORPHONE HCL 1 MG/ML IJ SOLN
0.5000 mg | INTRAMUSCULAR | Status: DC | PRN
  Administered 2024-10-27 (×2): 0.5 mg via INTRAVENOUS
  Filled 2024-10-26 (×2): qty 1

## 2024-10-26 MED ORDER — GLYCOPYRROLATE 0.2 MG/ML IJ SOLN
0.2000 mg | INTRAMUSCULAR | Status: DC | PRN
Start: 2024-10-26 — End: 2024-10-27

## 2024-10-26 MED ORDER — AMIODARONE HCL 200 MG PO TABS: 200.0000 mg | ORAL_TABLET | Freq: Every day | ORAL | Status: DC

## 2024-10-26 MED ORDER — AMIODARONE HCL IN DEXTROSE 360-4.14 MG/200ML-% IV SOLN: 30.0000 mg/h | INTRAVENOUS | Status: DC

## 2024-10-26 MED ORDER — SODIUM CHLORIDE 0.9 % IV SOLN: INTRAVENOUS | Status: DC

## 2024-10-26 MED ORDER — POLYVINYL ALCOHOL 1.4 % OP SOLN: 1.0000 [drp] | Freq: Four times a day (QID) | OPHTHALMIC | Status: DC | PRN

## 2024-10-26 MED ORDER — GLYCOPYRROLATE 1 MG PO TABS: 1.0000 mg | ORAL_TABLET | ORAL | Status: DC | PRN

## 2024-10-26 MED ORDER — ASPIRIN 81 MG PO CHEW
81.0000 mg | CHEWABLE_TABLET | ORAL | Status: DC
  Filled 2024-10-26: qty 1

## 2024-10-26 MED ORDER — SODIUM CHLORIDE 0.9% FLUSH: 3.0000 mL | Freq: Two times a day (BID) | INTRAVENOUS | Status: DC

## 2024-10-26 MED ORDER — BIOTENE DRY MOUTH MT LIQD: 15.0000 mL | OROMUCOSAL | Status: DC | PRN

## 2024-10-26 MED ORDER — OXYCODONE HCL 5 MG/5ML PO SOLN
2.5000 mg | ORAL | Status: DC | PRN
  Administered 2024-10-26: 2.5 mg via ORAL
  Filled 2024-10-26: qty 5

## 2024-10-26 MED ORDER — LORAZEPAM 2 MG/ML PO CONC
0.2500 mg | ORAL | Status: DC | PRN
  Administered 2024-10-26 (×2): 0.26 mg via ORAL
  Filled 2024-10-26 (×2): qty 1

## 2024-10-26 MED ORDER — LIDOCAINE HCL (PF) 1 % IJ SOLN
INTRAMUSCULAR | Status: DC | PRN
  Administered 2024-10-26: 2 mL via INTRADERMAL
  Administered 2024-10-26: 5 mL via INTRADERMAL
  Administered 2024-10-26: 2 mL via INTRADERMAL

## 2024-10-26 MED ORDER — SODIUM CHLORIDE 0.9 % IV SOLN: 3.0000 g | INTRAVENOUS | Status: DC

## 2024-10-26 MED ORDER — SODIUM CHLORIDE 0.9% FLUSH: 3.0000 mL | INTRAVENOUS | Status: DC | PRN

## 2024-10-26 MED ORDER — SODIUM CHLORIDE 0.9 % IV SOLN: 250.0000 mL | INTRAVENOUS | Status: DC | PRN

## 2024-10-26 MED ORDER — GLYCOPYRROLATE 0.2 MG/ML IJ SOLN: 0.2000 mg | INTRAMUSCULAR | Status: DC | PRN

## 2024-10-26 MED ORDER — LIDOCAINE HCL (PF) 1 % IJ SOLN
INTRAMUSCULAR | Status: AC
  Filled 2024-10-26: qty 30

## 2024-10-26 MED ORDER — HEPARIN (PORCINE) 25000 UT/250ML-% IV SOLN: 1000.0000 [IU]/h | INTRAVENOUS | Status: DC

## 2024-10-26 SURGICAL SUPPLY — 12 items
CATH BALLN WEDGE 5F 110CM (CATHETERS) IMPLANT
CATH SWAN GANZ 7F STRAIGHT (CATHETERS) IMPLANT
GLIDESHEATH SLENDER 7FR .021G (SHEATH) IMPLANT
GUIDEWIRE .025 260CM (WIRE) IMPLANT
KIT MICROPUNCTURE NIT STIFF (SHEATH) IMPLANT
PACK CARDIAC CATHETERIZATION (CUSTOM PROCEDURE TRAY) ×1 IMPLANT
SHEATH GLIDE SLENDER 4/5FR (SHEATH) IMPLANT
SHEATH PINNACLE 7F 10CM (SHEATH) IMPLANT
SHEATH PROBE COVER 6X72 (BAG) IMPLANT
TRANSDUCER W/STOPCOCK (MISCELLANEOUS) IMPLANT
TUBING ART PRESS 72 MALE/FEM (TUBING) IMPLANT
WIRE MICROINTRODUCER 60CM (WIRE) IMPLANT

## 2024-10-26 NOTE — Plan of Care (Signed)
  Problem: Education: Goal: Knowledge of General Education information will improve Description: Including pain rating scale, medication(s)/side effects and non-pharmacologic comfort measures Outcome: Completed/Met   Problem: Health Behavior/Discharge Planning: Goal: Ability to manage health-related needs will improve Outcome: Completed/Met   Problem: Clinical Measurements: Goal: Ability to maintain clinical measurements within normal limits will improve Outcome: Completed/Met Goal: Will remain free from infection Outcome: Completed/Met Goal: Diagnostic test results will improve Outcome: Completed/Met Goal: Respiratory complications will improve Outcome: Completed/Met Goal: Cardiovascular complication will be avoided Outcome: Completed/Met   Problem: Activity: Goal: Risk for activity intolerance will decrease Outcome: Completed/Met   Problem: Nutrition: Goal: Adequate nutrition will be maintained Outcome: Completed/Met   Problem: Coping: Goal: Level of anxiety will decrease Outcome: Completed/Met   Problem: Elimination: Goal: Will not experience complications related to bowel motility Outcome: Completed/Met Goal: Will not experience complications related to urinary retention Outcome: Completed/Met   Problem: Pain Managment: Goal: General experience of comfort will improve and/or be controlled Outcome: Completed/Met   Problem: Safety: Goal: Ability to remain free from injury will improve Outcome: Completed/Met   Problem: Skin Integrity: Goal: Risk for impaired skin integrity will decrease Outcome: Completed/Met   Problem: Education: Goal: Understanding of CV disease, CV risk reduction, and recovery process will improve Outcome: Completed/Met Goal: Individualized Educational Video(s) Outcome: Completed/Met   Problem: Activity: Goal: Ability to return to baseline activity level will improve Outcome: Completed/Met   Problem: Cardiovascular: Goal: Ability to  achieve and maintain adequate cardiovascular perfusion will improve Outcome: Completed/Met Goal: Vascular access site(s) Level 0-1 will be maintained Outcome: Completed/Met   Problem: Education: Goal: Knowledge of the prescribed therapeutic regimen will improve Outcome: Completed/Met   Problem: Coping: Goal: Ability to identify and develop effective coping behavior will improve Outcome: Completed/Met   Problem: Clinical Measurements: Goal: Quality of life will improve Outcome: Completed/Met   Problem: Respiratory: Goal: Verbalizations of increased ease of respirations will increase Outcome: Completed/Met   Problem: Role Relationship: Goal: Family's ability to cope with current situation will improve Outcome: Completed/Met Goal: Ability to verbalize concerns, feelings, and thoughts to partner or family member will improve Outcome: Completed/Met   Problem: Pain Management: Goal: Satisfaction with pain management regimen will improve Outcome: Completed/Met   Patient comfort care with plan to go home with hospice in morning.

## 2024-10-26 NOTE — Progress Notes (Signed)
 SPIRITUAL CARE AND COUNSELING CONSULT NOTE   VISIT SUMMARY:  I met with Malaya's family and provided support as they care for her. Alegra is originally from [place], but lived in [place]  for many years as well as [place] .  Many of her beloved family members have already passed and the family that is left is at bedside with her. Her daughters have a clear understanding and just want her to be comfortable. Her grandson is doing what he can to help himself through this difficult time.  He has a seizure disorder and his past seizures have been triggered by stress.  We talked about making sure he is taking care of himself. Pamula wanted to have some prayers in Spanish.  As she was raised Catholic, I read some of the prayers in Spanish and she moved her mouth along with the words.  One of her daughters has someone from her church that plans to come and visit her to offer additional prayers and blessings.    SPIRITUAL ENCOUNTER                                                                                                                                                                      Type of Visit: Initial Care provided to:: Patient, Family Referral source: Nurse (RN/NT/LPN) Reason for visit: End-of-life OnCall Visit: No   SPIRITUAL FRAMEWORK  Needs/Challenges/Barriers: Although Caysie understands some English, she would prefer to have prayer with someone who speaks Spanish. Family Stress Factors: Major life changes   GOALS    The goal now is her comfort.   INTERVENTIONS   Spiritual Care Interventions Made: Established relationship of care and support, Compassionate presence, Reflective listening, Normalization of emotions, Narrative/life review, Bereavement/grief support, Prayer, Supported grief process, Provided grief education    INTERVENTION OUTCOMES   Outcomes: Connection to spiritual care, Connected to spiritual community, Reduced anxiety  SPIRITUAL CARE PLAN   Spiritual  Care Issues Still Outstanding: Chaplain will continue to follow    If immediate needs arise, please contact [facility] / Behavioral Health 24 hour on call [phone]   Evins Lamarr Caldron, Chaplain  10/26/2024 3:20 PM

## 2024-10-26 NOTE — Interval H&P Note (Signed)
 History and Physical Interval Note:  10/26/2024 10:20 AM  Jennifer [name]  has presented today for surgery, with the diagnosis of heart failure.  The various methods of treatment have been discussed with the patient and family. After consideration of risks, benefits and other options for treatment, the patient has consented to  Procedure(s): RIGHT HEART CATH (N/A) as a surgical intervention.  The patient's history has been reviewed, patient examined, no change in status, stable for surgery.  I have reviewed the patient's chart and labs.  Questions were answered to the patient's satisfaction.     Daryle Amis [employer]

## 2024-10-26 NOTE — Progress Notes (Signed)
 PT Cancellation Note  Patient Details Name: Jennifer [name] MRN: [id number] DOB: [birth year]   Cancelled Treatment:    Reason Eval/Treat Not Completed: Medical issues which prohibited therapy  Noted on pressers and HR high. [name] PT Acute Rehabilitation Services Office [phone]  [name] 10/26/2024, 7:20 AM

## 2024-10-26 NOTE — Progress Notes (Signed)
 aPTT critical >200. Pharmacy notified. Hold heparin for 2 hours and restart at 0300 on 10/26/2024.

## 2024-10-26 NOTE — Progress Notes (Signed)
 [facility] PROGRESS NOTE    Progress Note  Mandi Mattioli  FMW:[id number] DOB: 1934/07/14 DOA: 10/20/2024 PCP: Cloria Annabella CROME, DO     Brief Narrative:   Laylani Pudwill is an 89 y.o. female past medical history significant for peripheral vascular disease, struct of sleep apnea on CPAP, depression stable angina, history of CAD with PCI to the LAD in 2019 repeated cath in 2014 with a stent placed, pulmonary hypertension, chronic respiratory failure on 2 L of oxygen presented with abdominal pain nausea and vomiting found to have a SBO by CT scan.  Hospital course was complicated for acute respiratory failure on chronic concerning for aspiration pneumonia versus acute HFpEF, she also developed A-fib with RVR and acute kidney injury.  Multiple consultants following cardiology and nephrology along with the surgery.   Assessment/Plan:   SBO (small bowel obstruction) (HCC)/ileus CT scan of the abdomen and pelvis showed moderate small bowel dilation with fecalization concerning for SBO with severe terminal ileal wall thickening and stenting into the cecal valve, etiology are uncertain concerning for inflammatory versus mass. Surgery on board treating conservatively with IV fluids NPO. Currently denying any abdominal pain had a small bowel movement the day prior to yesterday with a suppository. Surgery advance her diet to clears. Further management per surgery.  Is not a surgical candidate  Acute on chronic respiratory failure with hypoxia: Of unclear etiology question pulmonary hypertension versus pneumonia/aspiration pneumonia versus acute on chronic diastolic dysfunction: Pulmonary was consulted discontinue inhalers. Chest x-ray showed bilateral prominent interstitial opacities, nephrology on board and was started on IV diuresis. Diuresis was attempted but discontinued due to worsening renal function. She is currently on IV Unasyn  and azithromycin . CT of the chest shows subsegmental  infiltrates with trace pleural effusion. She remains on 6 L of oxygen to try to keep saturations greater than 90% Has remained afebrile and leukocytosis improved.  A-fib with RVR: Initially on Cardizem  drip, loaded with amiodarone  IV, and diltiazem  was discontinued due to hypotension she continues to be in A-fib with RVR. Cardiology on board, she is currently on IV heparin . 2D echo showed an EF of 65%.  No regional wall motion abnormality, the inferior vena cava is dilated greater than 50% respiratory variability, cannot rule out IVC thrombus. Abdominal Doppler to assess for IVC thrombus is pending.. Discontinue amlodipine . Continue Setia, statin and ranolazine .  Acute kidney injury: Of unclear etiology worse after IV diuresis. Cardiology and nephrology on board, her creatinine continues to worsen despite IV fluids. She is hyponatremic hypochloremic with a worsening renal function, with poor urine output in the setting of hypotension, with significant decreased oral intake.  I am concerned that she is hypovolemic. She appears euvolemic on physical exam Renal ultrasound without obstruction. Not a candidate for dialysis due to multiple comorbidities and age. Further management per nephrology. Discussed with nephrology to see if we can start IV fluids.  As she appears euvolemic on physical exam.  History of coronary artery disease: Holding Ranexa  due to acute kidney injury. Continue nitroglycerin  as needed.  Essential hypertension: Holding lisinopril and Norvasc . Currently on clear liquid diet.  Hypothyroidism: Continue Synthroid .  Depression: Continue Lexapro and Zoloft .  GERD: Continue PPI.  Cannot rule out IVC thrombus by echo: Ultrasound Doppler ordered.  DVT prophylaxis: heparin  Family Communication:none Status is: Inpatient Remains inpatient appropriate because: Daughter    Code Status:     Code Status Orders  (From admission, onward)           Start  Ordered   10/20/24 1705  Do not attempt resuscitation (DNR)- Limited -Do Not Intubate (DNI)  Continuous       Question Answer Comment  If pulseless and not breathing No CPR or chest compressions.   In Pre-Arrest Conditions (Patient Is Breathing and Has A Pulse) Do not intubate. Provide all appropriate non-invasive medical interventions. Avoid ICU transfer unless indicated or required.   Consent: Discussion documented in EHR or advanced directives reviewed      10/20/24 1707           Code Status History     This patient has a current code status but no historical code status.      Advance Directive Documentation    Flowsheet Row Most Recent Value  Type of Advance Directive Healthcare Power of Attorney, Living will, Out of facility DNR (pink MOST or yellow form)  Pre-existing out of facility DNR order (yellow form or pink MOST form) --  MOST Form in Place? --      IV Access:   Peripheral IV   Procedures and diagnostic studies:   CT CHEST WO CONTRAST Result Date: 10/25/2024 CLINICAL DATA:  Pneumonia complication suspected. EXAM: CT CHEST WITHOUT CONTRAST TECHNIQUE: Multidetector CT imaging of the chest was performed following the standard protocol without IV contrast. RADIATION DOSE REDUCTION: This exam was performed according to the departmental dose-optimization program which includes automated exposure control, adjustment of the mA and/or kV according to patient size and/or use of iterative reconstruction technique. COMPARISON:  Chest CT dated 03/15/2020. FINDINGS: Evaluation of this exam is limited in the absence of intravenous contrast. Cardiovascular: There is no cardiomegaly or pericardial effusion. There is coronary vascular calcification. Mild atherosclerotic calcification of the thoracic aorta. No aneurysmal dilatation. The central pulmonary arteries are grossly unremarkable. Mediastinum/Nodes: No hilar or mediastinal adenopathy. Moderate size hiatal hernia containing  a portion of the stomach. The esophagus is grossly unremarkable. No mediastinal fluid collection. Lungs/Pleura: Trace left pleural effusion, new since the prior CT. Bibasilar subsegmental consolidative changes are new since the prior CT and may represent atelectasis or infiltrate. Bilateral upper lobe linear and reticular scarring. No pneumothorax. The central airways are patent. Upper Abdomen: Partially visualized distended gallbladder. Colonic diverticulosis. Musculoskeletal: Osteopenia with degenerative changes of the spine. No acute osseous pathology. Old left lateral rib fractures with nonunion. IMPRESSION: 1. Bibasilar subsegmental consolidative changes may represent atelectasis but concerning for infiltrate. Follow-up to resolution recommended. 2. Trace left pleural effusion. 3. Moderate size hiatal hernia containing a portion of the stomach. 4.  Aortic Atherosclerosis (ICD10-I70.0). Electronically Signed   By: Vanetta Chou M.D.   On: 10/25/2024 17:50   CT ABDOMEN PELVIS WO CONTRAST Result Date: 10/25/2024 EXAM: CT ABDOMEN AND PELVIS WITHOUT CONTRAST 10/25/2024 02:45:00 PM TECHNIQUE: CT of the abdomen and pelvis was performed without the administration of intravenous contrast. Multiplanar reformatted images are provided for review. Automated exposure control, iterative reconstruction, and/or weight-based adjustment of the mA/kV was utilized to reduce the radiation dose to as low as reasonably achievable. COMPARISON: 5 days ago. CLINICAL HISTORY: Other abdominal pain. FINDINGS: LOWER CHEST: Minimal bibasilar atelectasis. LIVER: The liver is unremarkable. GALLBLADDER AND BILE DUCTS: Dilated gallbladder is noted. No biliary ductal dilatation. SPLEEN: No acute abnormality. PANCREAS: No acute abnormality. ADRENAL GLANDS: No acute abnormality. KIDNEYS, URETERS AND BLADDER: No stones in the kidneys or ureters. No hydronephrosis. No perinephric or periureteral stranding. Urinary bladder is unremarkable.  Bladder secondary to foley. GI AND BOWEL: Stomach demonstrates no acute abnormality. Worsening small bowel dilatation  is noted. Definite transition zone is not identified. New findings remain concerning for distal small bowel obstruction or ileus. Large periumbilical hernia is noted which contains portion of transverse colon but does not result in stricture. Sigmoid diverticulosis without inflammation. PERITONEUM AND RETROPERITONEUM: No ascites. No free air. VASCULATURE: Aorta is normal in caliber. LYMPH NODES: No lymphadenopathy. REPRODUCTIVE ORGANS: No acute abnormality. BONES AND SOFT TISSUES: No acute osseous abnormality. No focal soft tissue abnormality. IMPRESSION: 1. Worsening small bowel dilatation, concerning for distal small bowel obstruction or ileus, without a definite transition zone identified. 2. Large periumbilical hernia containing a portion of the transverse colon without obstruction. 3. Sigmoid diverticulosis without evidence of diverticulitis. Electronically signed by: Lynwood Seip MD 10/25/2024 04:27 PM EST RP Workstation: [id number]   DG Abd Portable 1V-Small Bowel Protocol-Position Verification Result Date: 10/25/2024 CLINICAL DATA:  Hypoxia and enteric tube placement EXAM: DG ABD PORTABLE 1V; DG CHEST 1V PORT COMPARISON:  Earlier same day abdominal radiograph at 7:30 a.m., chest radiograph dated 10/24/2024, CT abdomen and pelvis dated 10/20/2024 FINDINGS: Lines/tubes: Enteric tube courses to the left of the trachea and extends below the carina. The distal portion of the catheter sharply angulated at the level of the left basilar hemithorax with tip directed superiorly. Chest: Low lung volumes with bronchovascular crowding. Increased bilateral interstitial and patchy opacities. Similar blunting of bilateral costophrenic angles. No pneumothorax. Similar enlarged cardiomediastinal silhouette. Abdomen: Similar gas-filled dilation of the stomach and small bowel loops. No pneumatosis or free  air. No abnormal calcification or mass effect. Bones: No acute osseous abnormality. Unchanged lumbar spinal fusion hardware appears intact. IMPRESSION: 1. Enteric tube courses to the left of the trachea and extends below the carina with tip directed superiorly, likely within the moderate hiatal hernia. Recommend advancing. 2. Increased bilateral interstitial and patchy opacities, which may represent pulmonary edema, atelectasis, or infection. 3. Similar gas-filled dilation of the stomach and small bowel loops. Electronically Signed   By: Limin  Xu M.D.   On: 10/25/2024 12:03   DG CHEST PORT 1 VIEW Result Date: 10/25/2024 CLINICAL DATA:  Hypoxia and enteric tube placement EXAM: DG ABD PORTABLE 1V; DG CHEST 1V PORT COMPARISON:  Earlier same day abdominal radiograph at 7:30 a.m., chest radiograph dated 10/24/2024, CT abdomen and pelvis dated 10/20/2024 FINDINGS: Lines/tubes: Enteric tube courses to the left of the trachea and extends below the carina. The distal portion of the catheter sharply angulated at the level of the left basilar hemithorax with tip directed superiorly. Chest: Low lung volumes with bronchovascular crowding. Increased bilateral interstitial and patchy opacities. Similar blunting of bilateral costophrenic angles. No pneumothorax. Similar enlarged cardiomediastinal silhouette. Abdomen: Similar gas-filled dilation of the stomach and small bowel loops. No pneumatosis or free air. No abnormal calcification or mass effect. Bones: No acute osseous abnormality. Unchanged lumbar spinal fusion hardware appears intact. IMPRESSION: 1. Enteric tube courses to the left of the trachea and extends below the carina with tip directed superiorly, likely within the moderate hiatal hernia. Recommend advancing. 2. Increased bilateral interstitial and patchy opacities, which may represent pulmonary edema, atelectasis, or infection. 3. Similar gas-filled dilation of the stomach and small bowel loops. Electronically  Signed   By: Limin  Xu M.D.   On: 10/25/2024 12:03   DG Abd Portable 1V Result Date: 10/25/2024 CLINICAL DATA:  Enteric tube placement EXAM: DG ABD PORTABLE 1V COMPARISON:  Abdominal radiograph dated 10/22/2024 FINDINGS: Reported enteric tube is not seen within the field of view. Gas-filled dilation of the stomach and small bowel loops.  Lumbar spinal fusion hardware appears intact. IMPRESSION: 1. Reported enteric tube is not seen within the field of view. 2. Gas-filled dilation of the stomach and small bowel loops. Electronically Signed   By: Limin  Xu M.D.   On: 10/25/2024 11:58   US  RENAL Result Date: 10/25/2024 CLINICAL DATA:  AKI EXAM: RENAL / URINARY TRACT ULTRASOUND COMPLETE COMPARISON:  None Available. FINDINGS: Right Kidney: Renal measurements: 9.2 x 4.8 x 5.6 cm = volume: 128.7 mL. Echogenicity within normal limits. No mass or hydronephrosis visualized. Left Kidney: Renal measurements: 8.3 x 4.3 x 5.1 cm = volume: 95.4 mL. Echogenicity within normal limits. No mass or hydronephrosis visualized. Bladder: Bladder is decompressed with Foley catheter in place. Other: None. IMPRESSION: No specific abnormality identified. Electronically Signed   By: Harrietta Sherry M.D.   On: 10/25/2024 10:45   ECHOCARDIOGRAM COMPLETE Result Date: 10/24/2024    ECHOCARDIOGRAM REPORT   Patient Name:   MAKENLY LARABEE Date of Exam: 10/24/2024 Medical Rec #:  [id number]       Height:       62.0 in Accession #:    [id number]      Weight:       198.6 lb Date of Birth:  31-Oct-1934       BSA:          1.906 m Patient Age:    89 years        BP:           116/74 mmHg Patient Gender: F               HR:           126 bpm. Exam Location:  Inpatient Procedure: 2D Echo, Cardiac Doppler and Color Doppler (Both Spectral and Color            Flow Doppler were utilized during procedure). Indications:    I50.40* Unspecified combined systolic (congestive) and diastolic                 (congestive) heart failure  History:        Patient  has prior history of Echocardiogram examinations, most                 recent 02/25/2023. CAD, Pulmonary HTN, Signs/Symptoms:Shortness                 of Breath and Dyspnea; Risk Factors:Dyslipidemia.  Sonographer:    Ellouise Mose RDCS Referring Phys: [id number] [facility]  Sonographer Comments: Technically difficult study due to poor echo windows. Patient in rapid AFIB during exam. IMPRESSIONS  1. Left ventricular ejection fraction, by estimation, is 65 to 70%. Left ventricular ejection fraction by 2D MOD biplane is 63.8 %. Left ventricular ejection fraction by PLAX is 64 %. The left ventricle has normal function. The left ventricle has no regional wall motion abnormalities. Left ventricular diastolic parameters are indeterminate.  2. Right ventricular systolic function is normal. The right ventricular size is normal. There is normal pulmonary artery systolic pressure. The estimated right ventricular systolic pressure is 35.7 mmHg.  3. The mitral valve is normal in structure. Trivial mitral valve regurgitation. No evidence of mitral stenosis.  4. The aortic valve is tricuspid. Aortic valve regurgitation is not visualized. No aortic stenosis is present.  5. Cannot rule out thrombus in IVC. The inferior vena cava is dilated in size with >50% respiratory variability, suggesting right atrial pressure of 8 mmHg. FINDINGS  Left Ventricle: Mild intercavitary gradient of . Left ventricular ejection  fraction, by estimation, is 65 to 70%. Left ventricular ejection fraction by PLAX is 64 %. Left ventricular ejection fraction by 2D MOD biplane is 63.8 %. The left ventricle has normal function. The left ventricle has no regional wall motion abnormalities. The left ventricular internal cavity size was normal in size. There is no left ventricular hypertrophy. Left ventricular diastolic parameters are indeterminate. Normal left ventricular filling pressure. Right Ventricle: The right ventricular size is normal. No increase in  right ventricular wall thickness. Right ventricular systolic function is normal. There is normal pulmonary artery systolic pressure. The tricuspid regurgitant velocity is 2.63 m/s, and  with an assumed right atrial pressure of 8 mmHg, the estimated right ventricular systolic pressure is 35.7 mmHg. Left Atrium: Left atrial size was normal in size. Right Atrium: Right atrial size was normal in size. Pericardium: There is no evidence of pericardial effusion. Mitral Valve: The mitral valve is normal in structure. Trivial mitral valve regurgitation. No evidence of mitral valve stenosis. Tricuspid Valve: The tricuspid valve is normal in structure. Tricuspid valve regurgitation is mild. Aortic Valve: The aortic valve is tricuspid. Aortic valve regurgitation is not visualized. No aortic stenosis is present. Pulmonic Valve: The pulmonic valve was normal in structure. Pulmonic valve regurgitation is trivial. No evidence of pulmonic stenosis. Aorta: The aortic root and ascending aorta are structurally normal, with no evidence of dilitation. Venous: Cannot rule out thrombus in IVC. The inferior vena cava is dilated in size with greater than 50% respiratory variability, suggesting right atrial pressure of 8 mmHg. IAS/Shunts: No atrial level shunt detected by color flow Doppler.  LEFT VENTRICLE PLAX 2D                        Biplane EF (MOD) LV EF:         Left            LV Biplane EF:   Left                ventricular                      ventricular                ejection                         ejection                fraction by                      fraction by                PLAX is 64                       2D MOD                %.                               biplane is LVIDd:         2.40 cm                          63.8 %. LVIDs:         1.60 cm LV PW:  0.90 cm LV IVS:        0.90 cm LVOT diam:     1.60 cm LV SV:         32 LV SV Index:   17 LVOT Area:     2.01 cm  LV Volumes (MOD) LV vol d, MOD    43.7 ml A2C:  LV vol d, MOD    31.2 ml A4C: LV vol s, MOD    13.0 ml A2C: LV vol s, MOD    11.1 ml A4C: LV SV MOD A2C:   30.7 ml LV SV MOD A4C:   31.2 ml LV SV MOD BP:    23.9 ml RIGHT VENTRICLE             IVC RV S prime:     12.90 cm/s  IVC diam: 2.10 cm TAPSE (M-mode): 1.3 cm LEFT ATRIUM             Index        RIGHT ATRIUM           Index LA diam:        1.60 cm 0.84 cm/m   RA Area:     10.60 cm LA Vol (A2C):   17.2 ml 9.02 ml/m   RA Volume:   19.80 ml  10.39 ml/m LA Vol (A4C):   31.1 ml 16.32 ml/m LA Biplane Vol: 24.5 ml 12.85 ml/m  AORTIC VALVE LVOT Vmax:   113.00 cm/s LVOT Vmean:  76.300 cm/s LVOT VTI:    0.160 m  AORTA Ao Asc diam: 3.25 cm MITRAL VALVE               TRICUSPID VALVE MV Area (PHT): 3.13 cm    TR Peak grad:   27.7 mmHg MV Decel Time: 242 msec    TR Vmax:        263.00 cm/s MV E velocity: 94.50 cm/s                            SHUNTS                            Systemic VTI:  0.16 m                            Systemic Diam: 1.60 cm Morene Brownie Electronically signed by Morene Brownie Signature Date/Time: 10/24/2024/1:26:41 PM    Final    DG Chest Port 1 View Result Date: 10/24/2024 EXAM: 1 VIEW(S) XRAY OF THE CHEST 10/24/2024 06:18:25 AM COMPARISON: 10/23/2024. CLINICAL HISTORY: Dyspnea. FINDINGS: LUNGS AND PLEURA: Low lung volumes are noted. Persistent bilateral interstitial prominence is present. Trace right pleural effusion is seen. Asymmetric elevation of the left hemidiaphragm is unchanged. A left lower lobe bandlike opacity is present, which likely represents scar or subsegmental atelectasis. No pneumothorax. HEART AND MEDIASTINUM: Aortic atherosclerosis is present. A retrocardiac lucency is consistent with a hiatal hernia. No acute abnormality of the cardiac and mediastinal silhouettes. BONES AND SOFT TISSUES: Partially visualized lumbar spinal fusion hardware is noted. No acute osseous abnormality. IMPRESSION: 1. Persistent bilateral interstitial prominence with trace right pleural  effusion. Electronically signed by: Waddell Calk MD 10/24/2024 06:39 AM EST RP Workstation: [id number]     Medical Consultants:   None.   Subjective:    Melitza Cudworth no complaints today.  Objective:    Vitals:  10/25/24 2315 10/26/24 0109 10/26/24 0144 10/26/24 0357  BP:  (!) 106/54 111/60   Pulse:  (!) 41 95   Resp:  (!) 23    Temp: (!) 97.3 F (36.3 C)   98.6 F (37 C)  TempSrc: Axillary   Axillary  SpO2:  95%    Weight:      Height:       SpO2: 95 % O2 Flow Rate (L/min): 5 L/min FiO2 (%): 28 %   Intake/Output Summary (Last 24 hours) at 10/26/2024 0607 Last data filed at 10/26/2024 0331 Gross per 24 hour  Intake 1076.13 ml  Output 150 ml  Net 926.13 ml   Filed Weights   10/21/24 1433 10/24/24 1555 10/25/24 0701  Weight: 90.1 kg 90.6 kg 91.5 kg    Exam: General exam: In no acute distress. Respiratory system: Good air movement and clear to auscultation. Cardiovascular system: S1 & S2 heard, RRR. No JVD. Gastrointestinal system: Abdomen is nondistended, soft and nontender.  Extremities: No pedal edema. Skin: No rashes, lesions or ulcers Psychiatry: Judgement and insight appear normal. Mood & affect appropriate.    Data Reviewed:    Labs: Basic Metabolic Panel: Recent Labs  Lab 10/22/24 0524 10/23/24 0735 10/24/24 0505 10/25/24 0808 10/25/24 1450 10/26/24 0310  NA 143 137 137 133* 133* 132*  K 4.4 4.6 4.1 4.5 4.4 4.4  CL 107 101 100 96* 96* 95*  CO2 28 26 23 22  21* 19*  GLUCOSE 96 153* 140* 140* 130* 108*  BUN 15 15 28* 43* 47* 53*  CREATININE 1.14* 1.29* 2.16* 3.29* 3.45* 4.02*  CALCIUM 8.9 9.2 8.7* 8.4* 8.0* 7.7*  MG 2.3 2.3  --   --   --   --   PHOS  --  2.2*  --   --   --   --    GFR Estimated Creatinine Clearance: 10.2 mL/min (A) (by C-G formula based on SCr of 4.02 mg/dL (H)). Liver Function Tests: Recent Labs  Lab 10/20/24 1353 10/21/24 0459  AST 24 25  ALT 8 <5  ALKPHOS 126 99  BILITOT 0.5 0.6  PROT 8.4* 7.1   ALBUMIN 4.0 3.4*   Recent Labs  Lab 10/20/24 1353  LIPASE 28   No results for input(s): AMMONIA in the last 168 hours. Coagulation profile No results for input(s): INR, PROTIME in the last 168 hours. COVID-19 Labs  No results for input(s): DDIMER, FERRITIN, LDH, CRP in the last 72 hours.  Lab Results  Component Value Date   SARSCOV2NAA NOT DETECTED 05/12/2019    CBC: Recent Labs  Lab 10/20/24 1353 10/20/24 1404 10/21/24 0459 10/23/24 0735 10/24/24 0505 10/25/24 0808 10/26/24 0310  WBC 9.7  --  9.9 7.1 11.5* 10.0 8.9  NEUTROABS 8.5*  --   --   --   --   --   --   HGB 12.9   < > 11.6* 12.0 11.9* 11.7* 12.0  HCT 40.7   < > 37.3 38.6 37.5 36.5 38.6  MCV 90.6  --  91.9 90.8 89.9 89.9 91.0  PLT 303  --  306 273 278 306 298   < > = values in this interval not displayed.   Cardiac Enzymes: No results for input(s): CKTOTAL, CKMB, CKMBINDEX, TROPONINI in the last 168 hours. BNP (last 3 results) Recent Labs    10/23/24 0806  PROBNP 733.0*   CBG: No results for input(s): GLUCAP in the last 168 hours. D-Dimer: No results for input(s): DDIMER in the last  72 hours. Hgb A1c: No results for input(s): HGBA1C in the last 72 hours. Lipid Profile: No results for input(s): CHOL, HDL, LDLCALC, TRIG, CHOLHDL, LDLDIRECT in the last 72 hours. Thyroid function studies: Recent Labs    10/23/24 0735  TSH 2.380   Anemia work up: No results for input(s): VITAMINB12, FOLATE, FERRITIN, TIBC, IRON, RETICCTPCT in the last 72 hours. Sepsis Labs: Recent Labs  Lab 10/23/24 0735 10/23/24 0806 10/24/24 0505 10/25/24 0808 10/26/24 0310  PROCALCITON  --  0.33  --   --   --   WBC 7.1  --  11.5* 10.0 8.9   Microbiology No results found for this or any previous visit (from the past 240 hours).   Medications:    aspirin  81 mg Oral Pre-Cath   atorvastatin  40 mg Oral Daily   Chlorhexidine  Gluconate Cloth  6 each Topical Daily    ezetimibe  10 mg Oral Daily   feeding supplement  1 Container Oral TID BM   ipratropium  0.5 mg Nebulization TID   levothyroxine  75 mcg Oral QAC breakfast   metoprolol tartrate  25 mg Oral Q6H   pantoprazole (PROTONIX) IV  40 mg Intravenous Q12H   sertraline  50 mg Oral Daily   sodium chloride  flush  3 mL Intravenous Q12H   Continuous Infusions:  sodium chloride      amiodarone 30 mg/hr (10/26/24 0331)   ampicillin-sulbactam (UNASYN) IV Stopped (10/25/24 1851)   azithromycin Stopped (10/25/24 1145)   heparin 1,000 Units/hr (10/26/24 0331)      LOS: 6 days   Erle Odell Castor  [facility]  10/26/2024, 6:07 AM

## 2024-10-26 NOTE — Progress Notes (Signed)
 Patient left to go to [facility] for a R heart cath prior to being able to see her.  We will try to see her this afternoon when she returns to [facility].  Reviewed her incidental CT A/P from yesterday and patient appears to likely have an ileus.  Continue to treat conservatively at this time.  Burnard FORBES Banter, PA-C 9:25 AM

## 2024-10-26 NOTE — Progress Notes (Signed)
 OT Cancellation Note  Patient Details Name: Jennifer [name] MRN: [id number] DOB: 22-Mar-1934   Cancelled Treatment:    Reason Eval/Treat Not Completed: Medical issues which prohibited therapy  Delanna JINNY Lesches, OTR/L 10/26/2024, 8:54 AM

## 2024-10-26 NOTE — TOC Progression Note (Addendum)
 Transition of Care [facility]) - Progression Note    Patient Details  Name: Jennifer [name] MRN: [id number] Date of Birth: 05/31/1934  Transition of Care [facility] CM/SW Contact  Caylen Yardley, Nathanel, RN Phone Number: 10/26/2024, 3:33 PM  Clinical Narrative:Referral for home w/hospice. Contacted dtr Iris-agree to home w/hospice-patient is currently under [facility]-rep Crystal csw-left vm. I spoke to supv Amy Jackquline @ [facility]-she will f/u on hospice referral & dme(hospital bed) prior [facility]-rep Cheri is  on hold until [facility] has reviewed.  Iris dtr is aware.also aware that [facility] will review. Noted no ss#-Iris provided last 4 of ss#[id number].  Confirmed address @ d/c on demographic sheet. -3:48p-waiting further info on home w/hospice. Patients ss#[phone]. -4:19p-Received call from [facility] rep Crystal-states per dtr Iris patient will stay under [facility] services & d/c home w/their services-[facility] will deliver hospital bed to home prior patient d/c by [facility] in am. [facility] csw Crystal will contact Angela NCM when hospital bed has arrived in the home.I have confirmed w/Iris also d/c plan return w/[facility] to home-hospital bed to be deivered to home by [facility] sometime this evening. CSW @ [facility] will contact NCM in am for when [facility] can provide transport home.    Expected Discharge Plan: Home w Hospice Care Barriers to Discharge: Continued Medical Work up               Expected Discharge Plan and Services   Discharge Planning Services: CM Consult Post Acute Care Choice: Durable Medical Equipment, Hospice Living arrangements for the past 2 months: Apartment                                       Social Drivers of Health (SDOH) Interventions SDOH Screenings   Food Insecurity: No Food Insecurity (10/21/2024)  Housing: Low Risk  (10/21/2024)  Transportation Needs: No Transportation Needs (10/21/2024)  Utilities: Not At Risk (10/21/2024)  Social Connections: Unknown (10/22/2024)  Tobacco  Use: Low Risk  (10/22/2024)    Readmission Risk Interventions     No data to display

## 2024-10-26 NOTE — Progress Notes (Signed)
 Updated that patient is now comfort care. Please notify general surgery team should you have any further question or concerns. Will be glad to assist.   Marjorie Favre, PA-C 2:42 PM 10/26/2024

## 2024-10-26 NOTE — Progress Notes (Signed)
 Jennifer [name] Progress Note  Subjective:  No c/o's   Vitals:   10/26/24 1130 10/26/24 1258 10/26/24 1300 10/26/24 1400  BP: (!) 97/53 (!) 93/45 (!) 94/52 (!) 92/47  Pulse: 76 70 70 67  Resp: 20 18 18  (!) 24  Temp:   (!) 96.7 F (35.9 C)   TempSrc:   Axillary   SpO2: 98% 97% 99% 97%  Weight:      Height:        Exam: Gen: elderly female, deconditioned Sclera anicteric, throat clear  No jvd or bruits Chest clear bilat to bases RRR no MRG Abd soft ntnd no mass or ascites +bs Ext mild 1+ nonpitting edema LE's  Neuro is alert , nf   IP meds of interest: IVlasix 40mg  bid x 1 11/16 IV cardizem  ongoing today IV LR 75 cc/hr 11/13 to 11/15, now off IV contrast 80ml on 11/13, no other contrast    BP's on admit 11/13 were mostly 160-180/ 70-90s BP's on 11/14 were 107- 131/ 65-75 BP's on 11/15 were 109- 145/ 47-63 BP's on 11/16 were  128- 167/ 76- 86 BP's on 11/17 are     94- 124/ 62- 75, hR 90-130's Total I/O's are 4.3 in and 1.9 out   CXR 11/16 - looked +vasc congestion, early IS CXR 11/17- looks better, close to normal  UA 11/17: mod LE, prot 100, many bact, > 50 wbc, 6-10 rbc/epis UNa > 30, UCreat 106 Renal US : 9.2/ 8.3 cm kidneys, no hydronephrosis   Date                             Creat               eGFR (ml/min) 2019                            0.93- 1.14 2020                            0.92- 1.07 Sept 2021                    1.19                 48 ml/min                     Oct 2021                     1.13                 52 ml/min 11/13                           1.11                 48 ml/min                     11/14                           1.29                 40 ml/min 11/15  1.14                 46 ml/min 11/16                           1.29                              10/24/24                      2.16                 21 ml/min             Assessment/ Plan: AKI on CKD 3a: b/l creatinine is 1.1- 1.3 from this  admission, eGFR 40-48 ml/min. Pt did get IV contrast on 11/13 but renal function didn't drop until 11/16-11/17. Pt got IV lasix  for vol overload, on hold for now due to rising creatinine. Renal US  w/o obstruction, UA showed wbc's > mild rbc's. UOP remains low w/ 150 cc output yesterday. Creat continue to rise up to 4.0 today. AKI from hypotension, diast CHF +/- other. Pt is a not a candidate for RRT due to significant comorbidities. Poor prognosis. PCT meeting w/ family today.  Afib/ RVR: f/b cardiology, getting BB, eliquis and IV cardizem.  Partial SBO vs ileus: per pmd and gen surgery H/o HFpEF: seen by cardiology Chronic resp failure: on home O2 around the clock DNR-limited   Myer Fret MD  [facility] 10/26/2024, 3:22 PM  Recent Labs  Lab 10/20/24 1353 10/20/24 1404 10/21/24 0459 10/22/24 0524 10/23/24 0735 10/24/24 0505 10/25/24 0808 10/25/24 1450 10/26/24 0310  HGB 12.9   < > 11.6*  --  12.0   < > 11.7*  --  12.0  ALBUMIN 4.0  --  3.4*  --   --   --   --   --   --   CALCIUM 9.7  --  9.2   < > 9.2   < > 8.4* 8.0* 7.7*  PHOS  --   --   --   --  2.2*  --   --   --   --   CREATININE 1.11*   < > 1.29*   < > 1.29*   < > 3.29* 3.45* 4.02*  K 3.9   < > 4.3   < > 4.6   < > 4.5 4.4 4.4   < > = values in this interval not displayed.   No results for input(s): IRON, TIBC, FERRITIN in the last 168 hours. Inpatient medications:     acetaminophen  **OR** acetaminophen , antiseptic oral rinse, antiseptic oral rinse, artificial tears, glycopyrrolate **OR** glycopyrrolate **OR** glycopyrrolate, haloperidol, HYDROmorphone (DILAUDID) injection, levalbuterol, LORazepam, ondansetron  **OR** ondansetron  (ZOFRAN ) IV, oxyCODONE 

## 2024-10-26 NOTE — Progress Notes (Signed)
  Progress Note  Patient Name: Jennifer [name] Date of Encounter: 10/26/2024 [facility] Cardiologist: Shelda Bruckner, MD   Interval Summary   She reports feeling better today, especially with NGT removed. She has had minimal urine output and did not receive any metop due to borderline BP  Vital Signs Vitals:   10/26/24 1021 10/26/24 1044 10/26/24 1100 10/26/24 1130  BP:    (!) 97/53  Pulse:    76  Resp:    20  Temp:      TempSrc:      SpO2: 90% 93% 98% 98%  Weight:      Height:        Intake/Output Summary (Last 24 hours) at 10/26/2024 1228 Last data filed at 10/26/2024 0701 Gross per 24 hour  Intake 677.83 ml  Output 200 ml  Net 477.83 ml      10/26/2024    5:00 AM 10/25/2024    7:01 AM 10/24/2024    3:55 PM  Last 3 Weights  Weight (lbs) 204 lb 2.3 oz 201 lb 11.5 oz 199 lb 11.8 oz  Weight (kg) 92.6 kg 91.5 kg 90.6 kg      Telemetry/ECG  NSR - Personally Reviewed  Physical Exam  GEN: No acute distress.   Neck: No JVD Cardiac: RRR, no murmurs, rubs, or gallops.  Respiratory: Clear to auscultation bilaterally. GI: Soft, nontender, non-distended  MS: No edema  Assessment & Plan  Ms Vincelette is a 71 yoF with Hx of CAD s/p remote LAD PCI, HFpEF, PAD, chronic respiratory failure on 2L who presented with partial SBO vs. Ileus and was found to be in Afib briefly last week and started on dilt gtt. She went back into Afib with RVR on 11/17 requiring higher doses of Dilt and ultimately started on amio gtt. Off dilt gtt since 11/17 PM. TTE 11/25 with EF 65-70% and RHC w/  RA 20, PA 50/32(38), and PCWP 21.   #Afib with RVR #AKI #CAD/PAD #HFpEF w/ RV failure - Minimal urine output and borderline BP but still in NSR. RHC today with RA 20, PA 50/32(38), and PCWP 21 - Continues to have worsening renal failure, likely due to ATN vs. Renal congestion from RV failure. Nephrology discusses dialysis with patient and family, but declined at this time.  - Still  in NSR --> d/c metoprolol given borderline BP, and transition amio gtt to PO amio load (400 BID followed by 200 mg daily) - Abdominal vascular U/S to assess for IVC thrombus given TTE findings - Cont atorvastatin, ezetimibe, and ranolazine ; cont holding amlodipine  for now - We will continue to follow  For questions or updates, please contact [facility] Please consult [email or web address] for contact info under  Signed, Joelle VEAR Ren Donley, MD

## 2024-10-26 NOTE — Progress Notes (Signed)
 IVC/Iliac vein duplex has been completed. Preliminary results can be found in CV Proc through chart review.   10/26/24 1:53 PM Cathlyn Collet RVT

## 2024-10-26 NOTE — Consult Note (Addendum)
Consultation Note Date: 10/26/2024   Patient Name: Jennifer [name]  DOB: 08-14-1934  MRN: [id number]  Age / Sex: 89 y.o., female   PCP: Cloria Annabella CROME, DO Referring Physician: Odell Castor, Erle, MD  Reason for Consultation: Establishing goals of care     Chief Complaint/History of Present Illness:   Patient is an 89 year old female with a past medical history of peripheral vascular disease, obstructive sleep apnea on CPAP, depression, HFpEF, history of CAD status post PCI to LAD in 2019 with repeated cath in 2014 with stent placed, pulmonary hypertension, and chronic respiratory failure on 2 L of nasal cannula oxygen who was admitted on 10/20/2024 for management of abdominal pain and nausea and vomiting.  During hospital admission patient was found to have SBP/ileus on imaging.  Surgery consulted for recommendations.  Patient has also received management for acute on chronic respiratory failure, A-fib with RVR, and AKI.  Cardiology and nephrology consulted for recommendations.  Palliative medicine team consulted to assist with complex medical decision making.  Extensive review of EMR including recent documentation from surgery team, hospitalist, cardiologist, nephrologist, PCCM provider, and SLP.  Patient receiving conservative management for ileus.  Unfortunately patient's kidney function continues to worsen.  Patient is not an appropriate candidate for CRRT based on age and medical comorbidities; patient and family have also stated the patient would not want dialysis.  Patient underwent right heart cath today noting RV failure which could be worsening renal failure. Personally reviewed recent BMP noting BUN elevated to 53, creatinine elevated to 4.02, and estimated GFR of 10.  Personally reviewed chest CT obtained on 10/25/2024 noting bibasilar consolidative changes concerning for infiltrate. Discussed care with hospitalist, nephrologist, and bedside RN for medical updates.  Patient has been  becoming more confused with worsening renal function.  RN noted that patient's 2 daughters who are next of kin had stepped out into the waiting area.  ------------------------------------------------------------------------------------------------------------- Advance Care Planning Conversation  Pertinent diagnosis: RV failure, acute renal failure, acute on chronic respiratory failure, ileus  The patient and family consented to a voluntary Advance Care Planning Conversation in person. Individuals present for the conversation: Patient unable to participate in complex medical decision making due to underlying medical status.  This provider discussed care with patient's next of kin/ two daughters including patient's primary caregiver Iris.   Summary of the conversation:  Presented to waiting area admit patient's 2 daughters, including Iris who has been patient's primary caregiver.  Introduced myself as a member of the palliative medicine team my role in patient's medical journey.  Spent time learning about updates patient's family had heard.  Discussed patient's RV failure and worsening kidney function and expectations related to kidney failure.  Family noted they had reached out to [facility] to discuss next steps in care, since patient is a part of their program.  With permission, able to discuss goals for medical care moving forward.  Family notes that patient would not have wanted dialysis and she is not an appropriate candidate for it.  Family wants to work to get patient home for end-of-life care.  Discussed involvement of hospice and the support they would and would not provide.  Family would need hospital bed at home to assist in caring for patient.  Discussed transitioning to patient to full comfort focused care at this time and working to get patient home as soon as possible with hospice support.  Family supporting of this plan.  Answered all questions at this time and noted would reach out to  hospice as  requested.  Discussed care with [facility] [place] liaison at family's request and was informed patient could potentially get home tomorrow with [facility] [place] support.  Family supporting of this.  Presented to bedside shortly after discussing care with hospice liaison to update patient's daughters and grandson.  Again reviewed care plan at this time to focus on patient's comfort in setting of renal failure and RV failure.  Discussed providing medications for comfort at end-of-life.  Spent time answering questions and providing emotional support via active listening.  Noted palliative medicine team to continue to follow along with patient's medical journey.  Outcome of the conversations and/or documents completed:  Transition to full comfort focused care at this time.  Working to get patient home with hospice support as soon as coordinated.  I spent 30 minutes providing separately identifiable ACP services with the patient and/or surrogate decision maker in a voluntary, in-person conversation discussing the patient's wishes and goals as detailed in the above note.  Tinnie Radar, DO Palliative Medicine Provider  -------------------------------------------------------------------------------------------------------------  Discussed care with hospitalist, surgery team, [facility], [facility] liaison, nephrology, and bedside RN to coordinate care.  Transition to full comfort at this time.  Working to get patient home with hospice, potentially tomorrow.  UPDATE: Discussed care with [facility] provider provider and TOC. [facility] planning ot care for patient with their hospice services at home. Planning to deliver hospital bed tonight and see tomorrow in AM. [facility] liaison informed and consult for [facility] canceled. Discussed with patient's daughters and grandson at bedside. Also discussed while patient in the hospital, will continue to focus on comfort. With patient's hypotension did  express patient may not survive until AM to make it home and they acknowledge this.   Primary Diagnoses  Present on Admission:  SBO (small bowel obstruction) (HCC)   Past Medical History:  Diagnosis Date   Ambulates with cane    straight   At risk for falls    CAD (coronary artery disease)    Cataracts, bilateral    GERD (gastroesophageal reflux disease)    Hypercholesterolemia    Hypertension    Hypothyroidism    Major depression    Musculoskeletal back pain    CHRONIC LEFT-SIDED DISCOMFORT   Obstructive sleep apnea on CPAP    uses CPAP nightly   Osteopenia    PVD (peripheral vascular disease)    Stable angina    SVD (spontaneous vaginal delivery)    x 2   Vitamin D deficiency    Wears glasses    Wears partial dentures    upper   Social History   Socioeconomic History   Marital status: Widowed    Spouse name: Not on file   Number of children: 2   Years of education: Not on file   Highest education level: Not on file  Occupational History   Not on file  Tobacco Use   Smoking status: Never   Smokeless tobacco: Never  Vaping Use   Vaping status: Never Used  Substance and Sexual Activity   Alcohol use: Never   Drug use: Never   Sexual activity: Not on file  Other Topics Concern   Not on file  Social History Narrative   Not on file   Social Drivers of Health   Financial Resource Strain: Not on file  Food Insecurity: No Food Insecurity (10/21/2024)   Hunger Vital Sign    Worried About Running Out of Food in the  Last Year: Never true    Ran Out of Food in the Last Year: Never true  Transportation Needs: No Transportation Needs (10/21/2024)   PRAPARE - Administrator, Civil Service (Medical): No    Lack of Transportation (Non-Medical): No  Physical Activity: Not on file  Stress: Not on file  Social Connections: Unknown (10/22/2024)   Social Connection and Isolation Panel    Frequency of Communication with Friends and Family: More than three  times a week    Frequency of Social Gatherings with Friends and Family: More than three times a week    Attends Religious Services: More than 4 times per year    Active Member of Clubs or Organizations: Yes    Attends Banker Meetings: 1 to 4 times per year    Marital Status: Patient unable to answer   Family History  Problem Relation Age of Onset   Heart disease Mother    Asthma Father    Heart disease Brother 79       CAD   Scheduled Meds:  atorvastatin  40 mg Oral Daily   Chlorhexidine  Gluconate Cloth  6 each Topical Daily   ezetimibe  10 mg Oral Daily   feeding supplement  1 Container Oral TID BM   ipratropium  0.5 mg Nebulization TID   levothyroxine  75 mcg Oral QAC breakfast   pantoprazole (PROTONIX) IV  40 mg Intravenous Q12H   sertraline  50 mg Oral Daily   Continuous Infusions:  sodium chloride  Stopped (10/26/24 0640)   amiodarone     [START ON 10/20/2024] ampicillin-sulbactam (UNASYN) IV     azithromycin 500 mg (10/26/24 1310)   heparin     PRN Meds:.acetaminophen  **OR** acetaminophen , antiseptic oral rinse, hydrALAZINE, HYDROcodone -acetaminophen , HYDROmorphone (DILAUDID) injection, levalbuterol, lip balm, nitroGLYCERIN, ondansetron  **OR** ondansetron  (ZOFRAN ) IV No Known Allergies CBC:    Component Value Date/Time   WBC 8.9 10/26/2024 0310   HGB 12.0 10/26/2024 0310   HGB 12.1 06/04/2018 0956   HCT 38.6 10/26/2024 0310   HCT 37.7 06/04/2018 0956   PLT 298 10/26/2024 0310   PLT 292 06/04/2018 0956   MCV 91.0 10/26/2024 0310   MCV 81 06/04/2018 0956   NEUTROABS 8.5 (H) 10/20/2024 1353   LYMPHSABS 0.7 10/20/2024 1353   MONOABS 0.4 10/20/2024 1353   EOSABS 0.0 10/20/2024 1353   BASOSABS 0.0 10/20/2024 1353   Comprehensive Metabolic Panel:    Component Value Date/Time   NA 132 (L) 10/26/2024 0310   NA 140 09/10/2020 1001   K 4.4 10/26/2024 0310   CL 95 (L) 10/26/2024 0310   CO2 19 (L) 10/26/2024 0310   BUN 53 (H) 10/26/2024 0310   BUN 26  09/10/2020 1001   CREATININE 4.02 (H) 10/26/2024 0310   GLUCOSE 108 (H) 10/26/2024 0310   CALCIUM 7.7 (L) 10/26/2024 0310   AST 25 10/21/2024 0459   ALT <5 10/21/2024 0459   ALKPHOS 99 10/21/2024 0459   BILITOT 0.6 10/21/2024 0459   BILITOT 0.4 09/02/2019 0903   PROT 7.1 10/21/2024 0459   PROT 6.7 09/02/2019 0903   ALBUMIN 3.4 (L) 10/21/2024 0459   ALBUMIN 3.9 09/02/2019 0903    Physical Exam: Vital Signs: BP (!) 93/45 (BP Location: Left Arm)   Pulse 70   Temp (!) 96.7 F (35.9 C) (Axillary) Comment: warm blanket provided, thermostat adjusted  Resp 18   Ht 5' 2 (1.575 m)   Wt 92.6 kg   LMP  (LMP Unknown)  SpO2 97%   BMI 37.34 kg/m  SpO2: SpO2: 97 % O2 Device: O2 Device: Nasal Cannula O2 Flow Rate: O2 Flow Rate (L/min): 5 L/min Intake/output summary:  Intake/Output Summary (Last 24 hours) at 10/26/2024 1348 Last data filed at 10/26/2024 0701 Gross per 24 hour  Intake 677.83 ml  Output 200 ml  Net 477.83 ml   LBM: Last BM Date : 10/19/24 Baseline Weight: Weight: 90.1 kg Most recent weight: Weight: 92.6 kg  General: Grimacing, chronically ill-appearing, lethargic Cardiovascular: RRR Respiratory: Slightly increased work of breathing noted, not in respiratory distress Abdomen: not distended Neuro: lethargic         Palliative Performance Scale: 10%              Additional Data Reviewed: Recent Labs    10/25/24 0808 10/25/24 1450 10/26/24 0310  WBC 10.0  --  8.9  HGB 11.7*  --  12.0  PLT 306  --  298  NA 133* 133* 132*  BUN 43* 47* 53*  CREATININE 3.29* 3.45* 4.02*    Imaging: CARDIAC CATHETERIZATION 1. PCWP and RA pressure elevated, but RA pressure elevated out of  proportion suggesting primarily RV failure.  PAPi is low at 0.9.  2. Cardiac output preserved by Fick and thermodilution (though lower by  thermo).  3. Moderate primarily pulmonary venous hypertension.     I personally reviewed recent imaging.   Palliative Care Assessment and  Plan Summary of Established Goals of Care and Medical Treatment Preferences   Patient is an 89 year old female with a past medical history of peripheral vascular disease, obstructive sleep apnea on CPAP, depression, HFpEF, history of CAD status post PCI to LAD in 2019 with repeated cath in 2014 with stent placed, pulmonary hypertension, and chronic respiratory failure on 2 L of nasal cannula oxygen who was admitted on 10/20/2024 for management of abdominal pain and nausea and vomiting.  During hospital admission patient was found to have SBP/ileus on imaging.  Surgery consulted for recommendations.  Patient has also received management for acute on chronic respiratory failure, A-fib with RVR, and AKI.  Cardiology and nephrology consulted for recommendations.  Palliative medicine team consulted to assist with complex medical decision making.  # Complex medical decision making/goals of care  -Patient unable to participate in medical decision making secondary to medical status.  -Spoke with patient's NOK/daughters, including Iris who is patient's primary caregiver.  Discussed severity of patient's current medical illness.  Patient did not want aggressive medical interventions like dialysis and is appropriately not a candidate for this.  Discussed transitioning to full comfort focused care at this time and working to get patient home with hospice as soon as able.  Family supporting of this.  Discussed care with TOC. Initially [facility] going to assist with home care though [facility] noted they will plan to deliver hospital bed tonight and care for patient with their hospice program at home tomorrow.  Palliative medicine team continuing to follow along with patient's medical journey.  -At this time we will discontinue interventions that are no longer focused on comfort such as IV fluids, imaging, or lab work.  Will instead focus on symptom management of pain, dyspnea, and agitation in the setting of end-of-life care.     Code Status: Do not attempt resuscitation (DNR) - Comfort care  # Symptom management Patient is receiving these palliative interventions for symptom management with an intent to improve quality of life.     -Pain/Dyspnea, acute in the setting of end-of-life care   -  Start oral oxycodone  solution 2.5 every hour as needed.  Continue to adjust based on patient's symptom burden.                               - Change IV Dilaudid to 0.5 mg every 2 hours breakthrough pain or shortness of breath.                 -Anxiety/agitation, in the setting of end-of-life care                               -Start oral Ativan solution 0.26 mg every 4 hours as needed.  Continue to adjust based on patient's symptom burden.                               -Start oral Haldol solution 0.5 mg every 4 hours as needed. Continue to adjust based on patient's symptom burden.                   -Secretions, in the setting of end-of-life care                               -Start as needed glycopyrrolate  # Psycho-social/Spiritual Support:  - Support System: daughters, grandson - Desire for further Chaplain support:yes  # Discharge Planning:  Home with [facility]osp[facility] through [facility] program in AM if able. May not survive long enough for transfer and die in hospital.   Thank you for allowing the palliative care team to participate in the care Julia Saad.  Tinnie Radar, DO Palliative Care Provider PMT # [phone]  If patient remains symptomatic despite maximum doses, please call PMT at [phone] between 0700 and 1900. Outside of these hours, please call attending, as PMT does not have night coverage.  Billing based on MDM: High  Problems Addressed: One or more chronic illnesses with severe exacerbation, progression, or side effects of treatment.  Amount and/or Complexity of Data: Category 1:Review of prior external note(s) from each unique source, Review of the result(s) of each unique test, and Assessment requiring an  independent historian(s), Category 2:Independent interpretation of a test performed by another physician/other qualified health care professional (not separately reported), and Category 3:Discussion of management or test interpretation with external physician/other qualified health care professional/appropriate source (not separately reported)  Risks: Parenteral controlled substances

## 2024-10-26 NOTE — Progress Notes (Signed)
 PHARMACY - ANTICOAGULATION CONSULT NOTE  Pharmacy Consult for IV heparin Indication: Afib (while unable to take PO Eliquis)  No Known Allergies  Patient Measurements: Height: 5' 2 (157.5 cm) Weight: 92.6 kg (204 lb 2.3 oz) IBW/kg (Calculated) : 50.1 HEPARIN DW (KG): 71  Vital Signs: Temp: 97.6 F (36.4 C) (11/19 0725) Temp Source: Axillary (11/19 0725) BP: 103/58 (11/19 0842) Pulse Rate: 76 (11/19 1130)  Labs: Recent Labs    10/24/24 0505 10/25/24 0808 10/25/24 1450 10/25/24 1826 10/25/24 2111 10/26/24 0310  HGB 11.9* 11.7*  --   --   --  12.0  HCT 37.5 36.5  --   --   --  38.6  PLT 278 306  --   --   --  298  APTT  --  45*  --  147* >200*  --   HEPARINUNFRC  --  0.76*  --   --   --   --   CREATININE 2.16* 3.29* 3.45*  --   --  4.02*    Estimated Creatinine Clearance: 10.2 mL/min (A) (by C-G formula based on SCr of 4.02 mg/dL (H)).  Medical History: Past Medical History:  Diagnosis Date   Ambulates with cane    straight   At risk for falls    CAD (coronary artery disease)    Cataracts, bilateral    GERD (gastroesophageal reflux disease)    Hypercholesterolemia    Hypertension    Hypothyroidism    Major depression    Musculoskeletal back pain    CHRONIC LEFT-SIDED DISCOMFORT   Obstructive sleep apnea on CPAP    uses CPAP nightly   Osteopenia    PVD (peripheral vascular disease)    Stable angina    SVD (spontaneous vaginal delivery)    x 2   Vitamin D deficiency    Wears glasses    Wears partial dentures    upper    Medications: No anticoagulants PTA -Eliquis (new start inpatient) for afib  Assessment: 77 yoF with PMH HTN, CHF, chronic respiratory failure admitted 11/13 for SBO. Started on Eliquis on 11/17 for new Afib w/ RVR. Pt subsequently transferred to SDU on 11/17 for worsening respiratory failure. Pharmacy consulted to dose heparin while apixaban on hold.   Pt at [facility] this morning for RHC.   Pt received one dose of apixaban prior to  initiating heparin drip. DOAC can falsely elevated heparin level.   Today, 10/26/2024: CBC: WNL Worsening renal function  Pt underwent RHC. Discussed resumption of heparin drip with cardiologist > ok to resume 2 hours post sheath removal.  -Sheath removal documented on 10/26/24 @ 11:09  Goal of Therapy: Heparin level 0.3-0.7 units/ml aPTT 66-102 seconds Monitor platelets by anticoagulation protocol: Yes  Plan: Resume heparin at previous rate of 1000 units/hr Check 8 hour aPTT CBC, heparin level, aPTT daily Once heparin level and aPTT correlate, can monitor using heparin level only Monitor for signs of bleeding  Ronal CHRISTELLA Rav, PharmD 10/26/24 11:34 AM

## 2024-10-26 NOTE — Progress Notes (Signed)
 NAME:  Jennifer [name], MRN:  [id number], DOB:  1934-02-19, LOS: 6 ADMISSION DATE:  10/20/2024, CONSULTATION DATE: 10/26/24 REFERRING MD:  Dr. Owen, CHIEF COMPLAINT:  acute hypoxic respiratory failure   History of Present Illness:  [name] is an 89 y/o F with a PMH significant for PVD, OSA on CPAP, MDD, GERD, CAD s/p PCI in 2009, HTN, HFpEF, and chronic hypoxic respiratory failure who presented to the hospital for SBO with hospital course c/b acute on chronic hypoxic respiratory failure requiring NIPPV. Pulmonology consulted for evaluation and management of the latter.   The patient initially presented with sharp abdominal pain, nausea and abdominal distention. In the ED, the patient underwent CT ABD/PLV that showed moderate diffuse small bowl dilatation with fecalization concerning for distal obstruction likely terminal ileum. Surgery was consulted and since patient had some flatulance, NGT was not placed. They followed patient and KUBs improved. They started clears yesterday.   This AM, patient developed tachypnea, worsening hypoxia, and atrial fibrillation with RVR. She was started on dilt drip, given lasix  80 mg IVP x 1, and placed on BiPAP. This has improved her clinical status. CXR done shows bilateral pulmonary vascular congestion with cardiomegaly. She was also started on CAP coverage with ceftriaxone  and azithromycin  as well as steroids by primary medical team.  Of note, PFTs performed on 08/2023, does not show any evidence of COPD. DLCO is reduced suggestive of pulmonary vascular disease. Echo on 3/24 shows normal LVEF 60 - 65%, normal RV size and function.   Labs from this AM, BNP 733, Cr 1.29, WBC 7.1  Pertinent  Medical History   Past Medical History:  Diagnosis Date   Ambulates with cane    straight   At risk for falls    CAD (coronary artery disease)    Cataracts, bilateral    GERD (gastroesophageal reflux disease)    Hypercholesterolemia    Hypertension     Hypothyroidism    Major depression    Musculoskeletal back pain    CHRONIC LEFT-SIDED DISCOMFORT   Obstructive sleep apnea on CPAP    uses CPAP nightly   Osteopenia    PVD (peripheral vascular disease)    Stable angina    SVD (spontaneous vaginal delivery)    x 2   Vitamin D deficiency    Wears glasses    Wears partial dentures    upper   Significant Hospital Events: Including procedures, antibiotic start and stop dates in addition to other pertinent events   11/13: ED for abdominal pain >SBO admitted to hospitalist  11/15: starting clear liquids  11/16: HR increase to 160-170s, SOB when transfer from bed to chair > cardizem  bolus and gtt, EKG with RVR; cardiology consult. CCM consult  11/17: reconsult CCM, moving to SDU for amio gtt, on BIPAP 11/18: NAEON; off BiPAP this morning, converted to SR  Interim History / Subjective:  No acute events overnight, sent to [facility] for right heart cath today per cardiology Objective    Blood pressure (!) 103/58, pulse (!) 123, temperature 97.6 F (36.4 C), temperature source Axillary, resp. rate (!) 21, height 5' 2 (1.575 m), weight 92.6 kg, SpO2 90%.        Intake/Output Summary (Last 24 hours) at 10/26/2024 1037 Last data filed at 10/26/2024 0701 Gross per 24 hour  Intake 1238.37 ml  Output 200 ml  Net 1038.37 ml  Overall + 1.5 L Filed Weights   10/24/24 1555 10/25/24 0701 10/26/24 0500  Weight: 90.6 kg 91.5 kg  92.6 kg    Examination: General: older female, laying in bed, no acute distress  HENT: anicteric sclera, mucous membranes dry, nasal cannula, poor dentition Lungs: Clear to auscultation bilaterally, nasal cannula 5 L Cardiovascular: Sinus rhythm, no murmur Abdomen: rounded, soft Neuro: awake, oriented, follows commands GU: no foley   Resolved problem list   Assessment and Plan   #Acute on Chronic Hypoxic Respiratory Failure: #Acute on Chronic Diastolic Heart Failure: #Concern for aspiration  pneumonia: #OSA on CPAP #SBO   - cardiology following, appreciate help in management  - con't amiodarone, heparin gtt  - Transferring for right heart cath with cardiology at [facility], expect transfer back afterward - off BiPAP for now, would restart with any drowsiness or worsening respiratory effort and nightly  - d/c'd steroids, arformeterol and and budesonide yesterday by ccm attending with no PFT evidence of COPD  - CAP coverage with rocephin, azithromycin but have suspicion this is all HF driven (to end 11/21) - her UA was UTI - rocephin should cover unless resistant bug  - zetia, lipitor, synthroid, metoprolol when she is able to take PO  - recommend formal SLP to r/o aspiration component - -recommending esophagram for esophageal dysphagia, recommending sips of clears - xopenex preferred with RVR  - IS when not on bipap   Pulm will follow along. TRH to medically manage in SDU.   Labs   CBC: Recent Labs  Lab 10/20/24 1353 10/20/24 1404 10/21/24 0459 10/23/24 0735 10/24/24 0505 10/25/24 0808 10/26/24 0310  WBC 9.7  --  9.9 7.1 11.5* 10.0 8.9  NEUTROABS 8.5*  --   --   --   --   --   --   HGB 12.9   < > 11.6* 12.0 11.9* 11.7* 12.0  HCT 40.7   < > 37.3 38.6 37.5 36.5 38.6  MCV 90.6  --  91.9 90.8 89.9 89.9 91.0  PLT 303  --  306 273 278 306 298   < > = values in this interval not displayed.    Basic Metabolic Panel: Recent Labs  Lab 10/22/24 0524 10/23/24 0735 10/24/24 0505 10/25/24 0808 10/25/24 1450 10/26/24 0310  NA 143 137 137 133* 133* 132*  K 4.4 4.6 4.1 4.5 4.4 4.4  CL 107 101 100 96* 96* 95*  CO2 28 26 23 22  21* 19*  GLUCOSE 96 153* 140* 140* 130* 108*  BUN 15 15 28* 43* 47* 53*  CREATININE 1.14* 1.29* 2.16* 3.29* 3.45* 4.02*  CALCIUM 8.9 9.2 8.7* 8.4* 8.0* 7.7*  MG 2.3 2.3  --   --   --   --   PHOS  --  2.2*  --   --   --   --    GFR: Estimated Creatinine Clearance: 10.2 mL/min (A) (by C-G formula based on SCr of 4.02 mg/dL (H)). Recent Labs   Lab 10/23/24 0735 10/23/24 0806 10/24/24 0505 10/25/24 0808 10/26/24 0310  PROCALCITON  --  0.33  --   --   --   WBC 7.1  --  11.5* 10.0 8.9    Liver Function Tests: Recent Labs  Lab 10/20/24 1353 10/21/24 0459  AST 24 25  ALT 8 <5  ALKPHOS 126 99  BILITOT 0.5 0.6  PROT 8.4* 7.1  ALBUMIN 4.0 3.4*   Recent Labs  Lab 10/20/24 1353  LIPASE 28   No results for input(s): AMMONIA in the last 168 hours.  ABG    Component Value Date/Time   TCO2 30 10/20/2024  1404     Coagulation Profile: No results for input(s): INR, PROTIME in the last 168 hours.  Cardiac Enzymes: No results for input(s): CKTOTAL, CKMB, CKMBINDEX, TROPONINI in the last 168 hours.  HbA1C: No results found for: HGBA1C  CBG: No results for input(s): GLUCAP in the last 168 hours.  Review of Systems:   As above  Past Medical History:  She,  has a past medical history of Ambulates with cane, At risk for falls, CAD (coronary artery disease), Cataracts, bilateral, GERD (gastroesophageal reflux disease), Hypercholesterolemia, Hypertension, Hypothyroidism, Major depression, Musculoskeletal back pain, Obstructive sleep apnea on CPAP, Osteopenia, PVD (peripheral vascular disease), Stable angina, SVD (spontaneous vaginal delivery), Vitamin D deficiency, Wears glasses, and Wears partial dentures.   Surgical History:   Past Surgical History:  Procedure Laterality Date   ANKLE SURGERY Left    BACK SURGERY     CARDIAC CATHETERIZATION  06/2008   PCI to LAD with 3.0 x 12 mm DES   COLONOSCOPY     EYE SURGERY     remove cataracts - bilateral   MULTIPLE TOOTH EXTRACTIONS     upper teeth - upper dentures   OPEN REDUCTION INTERNAL FIXATION (ORIF) DISTAL RADIAL FRACTURE Left 05/16/2019   Procedure: OPEN REDUCTION INTERNAL FIXATION (ORIF) DISTAL RADIAL FRACTURE;  Surgeon: Sissy Cough, MD;  Location: [facility] OR;  Service: Orthopedics;  Laterality: Left;   SPINAL FUSION     UPPER GI ENDOSCOPY        Social History:   reports that she has never smoked. She has never used smokeless tobacco. She reports that she does not drink alcohol and does not use drugs.   Family History:  Her family history includes Asthma in her father; Heart disease in her mother; Heart disease (age of onset: 39) in her brother.   Allergies No Known Allergies   Home Medications  Prior to Admission medications   Medication Sig Start Date End Date Taking? Authorizing Provider  acetaminophen  (TYLENOL ) 650 MG CR tablet Take 1,300 mg by mouth in the morning.   Yes [provider]  albuterol  (VENTOLIN  HFA) 108 (90 Base) MCG/ACT inhaler Inhale 2 puffs into the lungs every 6 (six) hours as needed for wheezing or shortness of breath. Patient taking differently: Inhale 2 puffs into the lungs 3 (three) times daily as needed for wheezing or shortness of breath. 11/29/19  Yes Olalere, Adewale A, MD  amLODipine  (NORVASC ) 5 MG tablet Take 5 mg by mouth daily.   Yes [provider]  aspirin EC 81 MG tablet Take 81 mg by mouth in the morning. CARDIAC EVENT PREVENTION   Yes [provider]  benzonatate  (TESSALON ) 200 MG capsule Take 1 capsule (200 mg total) by mouth 3 (three) times daily as needed for cough. 09/22/22  Yes Olalere, Adewale A, MD  Cholecalciferol (VITAMIN D3) 5000 units TABS Take 5,000 Units by mouth daily with breakfast.    Yes [provider]  clopidogrel (PLAVIX) 75 MG tablet Take 75 mg by mouth daily.   Yes [provider]  ezetimibe (ZETIA) 10 MG tablet Take 10 mg by mouth daily.   Yes [provider]  Fluticasone -Umeclidin-Vilant (TRELEGY ELLIPTA ) 100-62.5-25 MCG/ACT AEPB Inhale 1 puff into the lungs daily. 03/19/23  Yes Olalere, Adewale A, MD  Hydrocortisone (PREPARATION H SOOTHING RELIEF EX) Place 1 application  rectally 4 (four) times daily as needed (for hemorrhoids).   Yes [provider]  ipratropium-albuterol  (DUONEB) 0.5-2.5 (3) MG/3ML SOLN  Take 3 mLs by nebulization every  4 (four) hours as needed (for shortness of breath).   Yes [provider]  levothyroxine  (SYNTHROID ) 75 MCG tablet Take 75 mcg by mouth daily before breakfast.   Yes [provider]  OXYGEN Inhale 2 L/min into the lungs as needed (for shortness of breath).   Yes [provider]  pantoprazole  (PROTONIX ) 40 MG tablet Take 40 mg by mouth daily before breakfast.   Yes [provider]  ranolazine  (RANEXA ) 500 MG 12 hr tablet Take 500 mg by mouth in the morning and at bedtime.   Yes [provider]  rosuvastatin  (CRESTOR ) 40 MG tablet Take 40 mg by mouth daily.   Yes [provider]  sertraline  (ZOLOFT ) 50 MG tablet Take 50 mg by mouth daily.   Yes [provider]  Spacer/Aero-Holding Chambers (AEROCHAMBER MV) inhaler Use as instructed 11/29/19  Yes Olalere, Adewale A, MD  SYSTANE 0.4-0.3 % GEL ophthalmic gel Place 1 Application into both eyes 2 (two) times daily.   Yes [provider]  torsemide  (DEMADEX ) 20 MG tablet Take 1 tablet (20 mg total) by mouth 2 (two) times daily. Patient taking differently: Take 60 mg by mouth 2 (two) times daily. 09/10/20 10/20/24 Yes Lonni Slain, MD  vitamin E 45 MG (100 UNITS) capsule Take 100 Units by mouth daily.   Yes [provider]  nitroGLYCERIN  (NITROSTAT ) 0.4 MG SL tablet Place 0.4 mg under the tongue every 5 (five) minutes as needed for chest pain.    [provider]  traMADol (ULTRAM) 50 MG tablet Take 50 mg by mouth daily as needed for moderate pain.    [provider]    CC time: 18  The patient is critically ill with multiple organ system failure and requires high complexity decision making for assessment and support, frequent evaluation and titration of therapies, advanced monitoring, review of radiographic studies and interpretation of complex data.    Critical Care Time devoted to patient care services, exclusive of  separately billable procedures, described in this note is 30   Tinnie FORBES Adolph DEVONNA [facility] 10/26/24 10:37 AM  Please see [email or web address] for pager details.  From 7A-7P if no response, please call [phone] After hours, please call ELink [phone]

## 2024-10-27 DIAGNOSIS — K56609 Unspecified intestinal obstruction, unspecified as to partial versus complete obstruction: Secondary | ICD-10-CM | POA: Diagnosis not present

## 2024-10-27 MED ORDER — HALOPERIDOL LACTATE 2 MG/ML PO CONC: 0.6000 mg | ORAL | Status: DC | PRN

## 2024-10-27 MED ORDER — OXYCODONE HCL 5 MG/5ML PO SOLN: 2.5000 mg | ORAL | Status: DC | PRN

## 2024-10-27 MED ORDER — LORAZEPAM 2 MG/ML PO CONC: 0.2000 mg | ORAL | Status: DC | PRN

## 2024-10-27 MED ORDER — GLYCOPYRROLATE 1 MG PO TABS: 1.0000 mg | ORAL_TABLET | ORAL | Status: DC | PRN

## 2024-11-07 NOTE — Discharge Summary (Addendum)
 Physician Discharge Summary  Jennifer [name] FMW:[id number] DOB: [birth year] DOA: 10/20/2024  PCP: Jennifer Annabella CROME, DO  Admit date: 10/20/2024 Discharge date:   Admitted From: Home Disposition:  Home  Recommendations for Outpatient Follow-up:  Home care with [facility] for end of life care  Home Health:No Equipment/Devices:None  Discharge Condition:Hospice CODE STATUS:DNR Diet recommendation: Comfort feeds  Brief/Interim Summary:  89 y.o. female past medical history significant for peripheral vascular disease, struct of sleep apnea on CPAP, depression stable angina, history of CAD with PCI to the LAD in 2019 repeated cath in 2014 with a stent placed, pulmonary hypertension, chronic respiratory failure on 2 L of oxygen presented with abdominal pain nausea and vomiting found to have a SBO by CT scan.  Hospital course was complicated for acute respiratory failure on chronic concerning for aspiration pneumonia versus acute HFpEF, she also developed A-fib with RVR and acute kidney injury.  Multiple consultants following cardiology and nephrology along with the surgery.   Discharge Diagnoses:  Principal Problem:   SBO (small bowel obstruction) (HCC) Active Problems:   Palliative care encounter   High risk medication use   Medication management   Counseling and coordination of care   Acute renal failure   RVF (right ventricular failure) (HCC)   Concern about end of life   DNR (do not resuscitate)   ACP (advance care planning)   Shortness of breath  Small bowel obstruction/ileus: CT scan of the abdomen pelvis showed moderate small bowel dilation with fecalization concerned about terminal small bowel obstruction versus ileus. Surgery was consulted and treating conservatively with IV, n.p.o. After she became comfort measures. She was given a comfort feed diet.  Acute kidney injury on chronic kidney disease stage IIIb Of unclear etiology, she did receive contrast on 10/20/2024 but  the function did not deteriorate until 10/24/2024. She was given IV Lasix  for volume overload but her creatinine continues to rise despite this. Renal ultrasound showed no acute findings. Acute kidney injury is likely from hypotension and probably chronic diastolic dysfunction not a candidate for dialysis. Her saturations were Stable with 3 L of oxygen. Her renal function continues to deteriorate despite IV fluids. Family met with palliative care decided to move toward comfort measures.  Acute respiratory failure with hypoxia: Possibly due to acute decompensated diastolic dysfunction. Cardiology was consulted and recommended right heart cath right pressure was 20 her wedge pressure was 21. She continues to have worsening renal failure. Hospice and palliative care was consulted and they decided to move towards comfort care.   A-fib with RVR: She was initially started on Cardizem  drip became hypotensive. Transition to IV amiodarone . Rate controlled cardiology was consulted was started on IV heparin . 2D echo showed an EF of 65% no regional wall motion abnormality. Antihypertensive medication were discontinued. Vascular ultrasound to rule out IVC thrombosis was negative.  History of coronary artery disease: Noted. Essential hypertension Hypothyroidism Depression   Goals of care: Patient was not a candidate for dialysis the family agreed and the patient would not want dialysis. They met with palliative care and decided to move towards comfort measures. She will be discharged home with hospice.  Discharge Instructions  Discharge Instructions     Diet - low sodium heart healthy   Complete by: As directed    Increase activity slowly   Complete by: As directed       Allergies as of    No Known Allergies      Medication List     STOP taking these  medications    acetaminophen  650 MG CR tablet Commonly known as: TYLENOL    AeroChamber MV inhaler   albuterol   108 (90 Base) MCG/ACT inhaler Commonly known as: VENTOLIN  HFA   amLODipine  5 MG tablet Commonly known as: NORVASC    aspirin  EC 81 MG tablet   benzonatate  200 MG capsule Commonly known as: TESSALON    clopidogrel 75 MG tablet Commonly known as: PLAVIX   ezetimibe  10 MG tablet Commonly known as: ZETIA    ipratropium-albuterol  0.5-2.5 (3) MG/3ML Soln Commonly known as: DUONEB   levothyroxine  75 MCG tablet Commonly known as: SYNTHROID    nitroGLYCERIN  0.4 MG SL tablet Commonly known as: NITROSTAT    OXYGEN   pantoprazole  40 MG tablet Commonly known as: PROTONIX    PREPARATION H SOOTHING RELIEF EX   ranolazine  500 MG 12 hr tablet Commonly known as: RANEXA    rosuvastatin  40 MG tablet Commonly known as: CRESTOR    sertraline  50 MG tablet Commonly known as: ZOLOFT    Systane 0.4-0.3 % Gel ophthalmic gel Generic drug: Polyethyl Glycol-Propyl Glycol   torsemide  20 MG tablet Commonly known as: DEMADEX    traMADol 50 MG tablet Commonly known as: ULTRAM   Trelegy Ellipta  100-62.5-25 MCG/ACT Aepb Generic drug: Fluticasone -Umeclidin-Vilant   Vitamin D3 125 MCG (5000 UT) Tabs   vitamin E 45 MG (100 UNITS) capsule       TAKE these medications    glycopyrrolate  1 MG tablet Commonly known as: ROBINUL  Take 1 tablet (1 mg total) by mouth every 4 (four) hours as needed (excessive secretions).   haloperidol  2 MG/ML solution Commonly known as: HALDOL  Place 0.3 mLs (0.6 mg total) under the tongue every 4 (four) hours as needed for agitation (or delirium).   LORazepam  2 MG/ML concentrated solution Commonly known as: ATIVAN  Take 0.1 mLs (0.2 mg total) by mouth every 4 (four) hours as needed for anxiety.   oxyCODONE  5 MG/5ML solution Commonly known as: ROXICODONE  Take 2.5 mLs (2.5 mg total) by mouth every hour as needed for moderate pain (pain score 4-6) or severe pain (pain score 7-10).        No Known  Allergies  Consultations: Pulmonary Cardiology Nephrology   Procedures/Studies: VAS US  IVC/ILIAC (VENOUS ONLY) Result Date: 10/26/2024 IVC/ILIAC STUDY Patient Name:  Jennifer [name]  Date of Exam:   10/26/2024 Medical Rec #: [id number]        Accession #:    [id number] Date of Birth: Nov 03, 1934        Patient Gender: F Patient Age:   89 years Exam Location:  [facility] Procedure:      VAS US  IVC/ILIAC (VENOUS ONLY) Referring Phys: ZANE ADAMS --------------------------------------------------------------------------------  Indications: Atrial fibrillation (HCC) [427.31.ICD-9-CM] Risk Factors: Hypertension, hyperlipidemia. Limitations: Obesity, air/bowel gas and patient discomfort.  Comparison Study: No prior studies. Performing Technologist: Cordella Collet RVT  Examination Guidelines: A complete evaluation includes B-mode imaging, spectral Doppler, color Doppler, and power Doppler as needed of all accessible portions of each vessel. Bilateral testing is considered an integral part of a complete examination. Limited examinations for reoccurring indications may be performed as noted.  IVC/Iliac Findings: +----------+------+--------+-------------------+    IVC    PatentThrombus     Comments       +----------+------+--------+-------------------+ IVC Prox  patent                            +----------+------+--------+-------------------+ IVC Mid                 Unable to  visualize +----------+------+--------+-------------------+ IVC Distal              Unable to visualize +----------+------+--------+-------------------+  +-------------------+---------+-----------+---------+-----------+--------+         CIV        RT-PatentRT-ThrombusLT-PatentLT-ThrombusComments +-------------------+---------+-----------+---------+-----------+--------+ Common Iliac Prox   patent              patent                       +-------------------+---------+-----------+---------+-----------+--------+ Common Iliac Mid    patent              patent                      +-------------------+---------+-----------+---------+-----------+--------+ Common Iliac Distal patent              patent                      +-------------------+---------+-----------+---------+-----------+--------+  +-------------------------+---------+-----------+---------+-----------+--------+            EIV           RT-PatentRT-ThrombusLT-PatentLT-ThrombusComments +-------------------------+---------+-----------+---------+-----------+--------+ External Iliac Vein Prox  patent              patent                      +-------------------------+---------+-----------+---------+-----------+--------+ External Iliac Vein Mid   patent              patent                      +-------------------------+---------+-----------+---------+-----------+--------+ External Iliac Vein       patent              patent                      Distal                                                                    +-------------------------+---------+-----------+---------+-----------+--------+   Summary: IVC/Iliac: Visualized portions of the IVC and iliac systems appear patent.  *See table(s) above for measurements and observations.  Electronically signed by Lonni Gaskins MD on 10/26/2024 at 3:29:27 PM.    Final    CARDIAC CATHETERIZATION Result Date: 10/26/2024 1. PCWP and RA pressure elevated, but RA pressure elevated out of proportion suggesting primarily RV failure.  PAPi is low at 0.9. 2. Cardiac output preserved by Fick and thermodilution (though lower by thermo). 3. Moderate primarily pulmonary venous hypertension.   CT CHEST WO CONTRAST Result Date: 10/25/2024 CLINICAL DATA:  Pneumonia complication suspected. EXAM: CT CHEST WITHOUT CONTRAST TECHNIQUE: Multidetector CT imaging of the chest was performed following the  standard protocol without IV contrast. RADIATION DOSE REDUCTION: This exam was performed according to the departmental dose-optimization program which includes automated exposure control, adjustment of the mA and/or kV according to patient size and/or use of iterative reconstruction technique. COMPARISON:  Chest CT dated 03/15/2020. FINDINGS: Evaluation of this exam is limited in the absence of intravenous contrast. Cardiovascular: There is no cardiomegaly or pericardial effusion. There is coronary vascular calcification. Mild atherosclerotic calcification of the thoracic aorta. No aneurysmal dilatation. The central pulmonary arteries are grossly unremarkable. Mediastinum/Nodes:  No hilar or mediastinal adenopathy. Moderate size hiatal hernia containing a portion of the stomach. The esophagus is grossly unremarkable. No mediastinal fluid collection. Lungs/Pleura: Trace left pleural effusion, new since the prior CT. Bibasilar subsegmental consolidative changes are new since the prior CT and may represent atelectasis or infiltrate. Bilateral upper lobe linear and reticular scarring. No pneumothorax. The central airways are patent. Upper Abdomen: Partially visualized distended gallbladder. Colonic diverticulosis. Musculoskeletal: Osteopenia with degenerative changes of the spine. No acute osseous pathology. Old left lateral rib fractures with nonunion. IMPRESSION: 1. Bibasilar subsegmental consolidative changes may represent atelectasis but concerning for infiltrate. Follow-up to resolution recommended. 2. Trace left pleural effusion. 3. Moderate size hiatal hernia containing a portion of the stomach. 4.  Aortic Atherosclerosis (ICD10-I70.0). Electronically Signed   By: Vanetta Chou M.D.   On: 10/25/2024 17:50   CT ABDOMEN PELVIS WO CONTRAST Result Date: 10/25/2024 EXAM: CT ABDOMEN AND PELVIS WITHOUT CONTRAST 10/25/2024 02:45:00 PM TECHNIQUE: CT of the abdomen and pelvis was performed without the administration  of intravenous contrast. Multiplanar reformatted images are provided for review. Automated exposure control, iterative reconstruction, and/or weight-based adjustment of the mA/kV was utilized to reduce the radiation dose to as low as reasonably achievable. COMPARISON: 5 days ago. CLINICAL HISTORY: Other abdominal pain. FINDINGS: LOWER CHEST: Minimal bibasilar atelectasis. LIVER: The liver is unremarkable. GALLBLADDER AND BILE DUCTS: Dilated gallbladder is noted. No biliary ductal dilatation. SPLEEN: No acute abnormality. PANCREAS: No acute abnormality. ADRENAL GLANDS: No acute abnormality. KIDNEYS, URETERS AND BLADDER: No stones in the kidneys or ureters. No hydronephrosis. No perinephric or periureteral stranding. Urinary bladder is unremarkable. Bladder secondary to foley. GI AND BOWEL: Stomach demonstrates no acute abnormality. Worsening small bowel dilatation is noted. Definite transition zone is not identified. New findings remain concerning for distal small bowel obstruction or ileus. Large periumbilical hernia is noted which contains portion of transverse colon but does not result in stricture. Sigmoid diverticulosis without inflammation. PERITONEUM AND RETROPERITONEUM: No ascites. No free air. VASCULATURE: Aorta is normal in caliber. LYMPH NODES: No lymphadenopathy. REPRODUCTIVE ORGANS: No acute abnormality. BONES AND SOFT TISSUES: No acute osseous abnormality. No focal soft tissue abnormality. IMPRESSION: 1. Worsening small bowel dilatation, concerning for distal small bowel obstruction or ileus, without a definite transition zone identified. 2. Large periumbilical hernia containing a portion of the transverse colon without obstruction. 3. Sigmoid diverticulosis without evidence of diverticulitis. Electronically signed by: Lynwood Seip MD 10/25/2024 04:27 PM EST RP Workstation: [id number]   DG Abd Portable 1V-Small Bowel Protocol-Position Verification Result Date: 10/25/2024 CLINICAL DATA:  Hypoxia and  enteric tube placement EXAM: DG ABD PORTABLE 1V; DG CHEST 1V PORT COMPARISON:  Earlier same day abdominal radiograph at 7:30 a.m., chest radiograph dated 10/24/2024, CT abdomen and pelvis dated 10/20/2024 FINDINGS: Lines/tubes: Enteric tube courses to the left of the trachea and extends below the carina. The distal portion of the catheter sharply angulated at the level of the left basilar hemithorax with tip directed superiorly. Chest: Low lung volumes with bronchovascular crowding. Increased bilateral interstitial and patchy opacities. Similar blunting of bilateral costophrenic angles. No pneumothorax. Similar enlarged cardiomediastinal silhouette. Abdomen: Similar gas-filled dilation of the stomach and small bowel loops. No pneumatosis or free air. No abnormal calcification or mass effect. Bones: No acute osseous abnormality. Unchanged lumbar spinal fusion hardware appears intact. IMPRESSION: 1. Enteric tube courses to the left of the trachea and extends below the carina with tip directed superiorly, likely within the moderate hiatal hernia. Recommend advancing. 2. Increased bilateral interstitial and  patchy opacities, which may represent pulmonary edema, atelectasis, or infection. 3. Similar gas-filled dilation of the stomach and small bowel loops. Electronically Signed   By: Limin  Xu M.D.   On: 10/25/2024 12:03   DG CHEST PORT 1 VIEW Result Date: 10/25/2024 CLINICAL DATA:  Hypoxia and enteric tube placement EXAM: DG ABD PORTABLE 1V; DG CHEST 1V PORT COMPARISON:  Earlier same day abdominal radiograph at 7:30 a.m., chest radiograph dated 10/24/2024, CT abdomen and pelvis dated 10/20/2024 FINDINGS: Lines/tubes: Enteric tube courses to the left of the trachea and extends below the carina. The distal portion of the catheter sharply angulated at the level of the left basilar hemithorax with tip directed superiorly. Chest: Low lung volumes with bronchovascular crowding. Increased bilateral interstitial and patchy  opacities. Similar blunting of bilateral costophrenic angles. No pneumothorax. Similar enlarged cardiomediastinal silhouette. Abdomen: Similar gas-filled dilation of the stomach and small bowel loops. No pneumatosis or free air. No abnormal calcification or mass effect. Bones: No acute osseous abnormality. Unchanged lumbar spinal fusion hardware appears intact. IMPRESSION: 1. Enteric tube courses to the left of the trachea and extends below the carina with tip directed superiorly, likely within the moderate hiatal hernia. Recommend advancing. 2. Increased bilateral interstitial and patchy opacities, which may represent pulmonary edema, atelectasis, or infection. 3. Similar gas-filled dilation of the stomach and small bowel loops. Electronically Signed   By: Limin  Xu M.D.   On: 10/25/2024 12:03   DG Abd Portable 1V Result Date: 10/25/2024 CLINICAL DATA:  Enteric tube placement EXAM: DG ABD PORTABLE 1V COMPARISON:  Abdominal radiograph dated 10/22/2024 FINDINGS: Reported enteric tube is not seen within the field of view. Gas-filled dilation of the stomach and small bowel loops. Lumbar spinal fusion hardware appears intact. IMPRESSION: 1. Reported enteric tube is not seen within the field of view. 2. Gas-filled dilation of the stomach and small bowel loops. Electronically Signed   By: Limin  Xu M.D.   On: 10/25/2024 11:58   US  RENAL Result Date: 10/25/2024 CLINICAL DATA:  AKI EXAM: RENAL / URINARY TRACT ULTRASOUND COMPLETE COMPARISON:  None Available. FINDINGS: Right Kidney: Renal measurements: 9.2 x 4.8 x 5.6 cm = volume: 128.7 mL. Echogenicity within normal limits. No mass or hydronephrosis visualized. Left Kidney: Renal measurements: 8.3 x 4.3 x 5.1 cm = volume: 95.4 mL. Echogenicity within normal limits. No mass or hydronephrosis visualized. Bladder: Bladder is decompressed with Foley catheter in place. Other: None. IMPRESSION: No specific abnormality identified. Electronically Signed   By: Harrietta Sherry M.D.   On: 10/25/2024 10:45   ECHOCARDIOGRAM COMPLETE Result Date: 10/24/2024    ECHOCARDIOGRAM REPORT   Patient Name:   MALLI FALOTICO Date of Exam: 10/24/2024 Medical Rec #:  [id number]       Height:       62.0 in Accession #:    [id number]      Weight:       198.6 lb Date of Birth:  Jul 20, 1934       BSA:          1.906 m Patient Age:    89 years        BP:           116/74 mmHg Patient Gender: F               HR:           126 bpm. Exam Location:  Inpatient Procedure: 2D Echo, Cardiac Doppler and Color Doppler (Both Spectral and Color  Flow Doppler were utilized during procedure). Indications:    I50.40* Unspecified combined systolic (congestive) and diastolic                 (congestive) heart failure  History:        Patient has prior history of Echocardiogram examinations, most                 recent 02/25/2023. CAD, Pulmonary HTN, Signs/Symptoms:Shortness                 of Breath and Dyspnea; Risk Factors:Dyslipidemia.  Sonographer:    Ellouise Mose RDCS Referring Phys: [id number] [facility]  Sonographer Comments: Technically difficult study due to poor echo windows. Patient in rapid AFIB during exam. IMPRESSIONS  1. Left ventricular ejection fraction, by estimation, is 65 to 70%. Left ventricular ejection fraction by 2D MOD biplane is 63.8 %. Left ventricular ejection fraction by PLAX is 64 %. The left ventricle has normal function. The left ventricle has no regional wall motion abnormalities. Left ventricular diastolic parameters are indeterminate.  2. Right ventricular systolic function is normal. The right ventricular size is normal. There is normal pulmonary artery systolic pressure. The estimated right ventricular systolic pressure is 35.7 mmHg.  3. The mitral valve is normal in structure. Trivial mitral valve regurgitation. No evidence of mitral stenosis.  4. The aortic valve is tricuspid. Aortic valve regurgitation is not visualized. No aortic stenosis is present.  5. Cannot rule out  thrombus in IVC. The inferior vena cava is dilated in size with >50% respiratory variability, suggesting right atrial pressure of 8 mmHg. FINDINGS  Left Ventricle: Mild intercavitary gradient of . Left ventricular ejection fraction, by estimation, is 65 to 70%. Left ventricular ejection fraction by PLAX is 64 %. Left ventricular ejection fraction by 2D MOD biplane is 63.8 %. The left ventricle has normal function. The left ventricle has no regional wall motion abnormalities. The left ventricular internal cavity size was normal in size. There is no left ventricular hypertrophy. Left ventricular diastolic parameters are indeterminate. Normal left ventricular filling pressure. Right Ventricle: The right ventricular size is normal. No increase in right ventricular wall thickness. Right ventricular systolic function is normal. There is normal pulmonary artery systolic pressure. The tricuspid regurgitant velocity is 2.63 m/s, and  with an assumed right atrial pressure of 8 mmHg, the estimated right ventricular systolic pressure is 35.7 mmHg. Left Atrium: Left atrial size was normal in size. Right Atrium: Right atrial size was normal in size. Pericardium: There is no evidence of pericardial effusion. Mitral Valve: The mitral valve is normal in structure. Trivial mitral valve regurgitation. No evidence of mitral valve stenosis. Tricuspid Valve: The tricuspid valve is normal in structure. Tricuspid valve regurgitation is mild. Aortic Valve: The aortic valve is tricuspid. Aortic valve regurgitation is not visualized. No aortic stenosis is present. Pulmonic Valve: The pulmonic valve was normal in structure. Pulmonic valve regurgitation is trivial. No evidence of pulmonic stenosis. Aorta: The aortic root and ascending aorta are structurally normal, with no evidence of dilitation. Venous: Cannot rule out thrombus in IVC. The inferior vena cava is dilated in size with greater than 50% respiratory variability, suggesting  right atrial pressure of 8 mmHg. IAS/Shunts: No atrial level shunt detected by color flow Doppler.  LEFT VENTRICLE PLAX 2D                        Biplane EF (MOD) LV EF:  Left            LV Biplane EF:   Left                ventricular                      ventricular                ejection                         ejection                fraction by                      fraction by                PLAX is 64                       2D MOD                %.                               biplane is LVIDd:         2.40 cm                          63.8 %. LVIDs:         1.60 cm LV PW:         0.90 cm LV IVS:        0.90 cm LVOT diam:     1.60 cm LV SV:         32 LV SV Index:   17 LVOT Area:     2.01 cm  LV Volumes (MOD) LV vol d, MOD    43.7 ml A2C: LV vol d, MOD    31.2 ml A4C: LV vol s, MOD    13.0 ml A2C: LV vol s, MOD    11.1 ml A4C: LV SV MOD A2C:   30.7 ml LV SV MOD A4C:   31.2 ml LV SV MOD BP:    23.9 ml RIGHT VENTRICLE             IVC RV S prime:     12.90 cm/s  IVC diam: 2.10 cm TAPSE (M-mode): 1.3 cm LEFT ATRIUM             Index        RIGHT ATRIUM           Index LA diam:        1.60 cm 0.84 cm/m   RA Area:     10.60 cm LA Vol (A2C):   17.2 ml 9.02 ml/m   RA Volume:   19.80 ml  10.39 ml/m LA Vol (A4C):   31.1 ml 16.32 ml/m LA Biplane Vol: 24.5 ml 12.85 ml/m  AORTIC VALVE LVOT Vmax:   113.00 cm/s LVOT Vmean:  76.300 cm/s LVOT VTI:    0.160 m  AORTA Ao Asc diam: 3.25 cm MITRAL VALVE               TRICUSPID VALVE MV Area (PHT): 3.13 cm    TR Peak grad:   27.7 mmHg MV Decel Time: 242 msec    TR Vmax:  263.00 cm/s MV E velocity: 94.50 cm/s                            SHUNTS                            Systemic VTI:  0.16 m                            Systemic Diam: 1.60 cm Morene Brownie Electronically signed by Morene Brownie Signature Date/Time: 10/24/2024/1:26:41 PM    Final    DG Chest Port 1 View Result Date: 10/24/2024 EXAM: 1 VIEW(S) XRAY OF THE CHEST 10/24/2024 06:18:25 AM  COMPARISON: 10/23/2024. CLINICAL HISTORY: Dyspnea. FINDINGS: LUNGS AND PLEURA: Low lung volumes are noted. Persistent bilateral interstitial prominence is present. Trace right pleural effusion is seen. Asymmetric elevation of the left hemidiaphragm is unchanged. A left lower lobe bandlike opacity is present, which likely represents scar or subsegmental atelectasis. No pneumothorax. HEART AND MEDIASTINUM: Aortic atherosclerosis is present. A retrocardiac lucency is consistent with a hiatal hernia. No acute abnormality of the cardiac and mediastinal silhouettes. BONES AND SOFT TISSUES: Partially visualized lumbar spinal fusion hardware is noted. No acute osseous abnormality. IMPRESSION: 1. Persistent bilateral interstitial prominence with trace right pleural effusion. Electronically signed by: Waddell Calk MD 10/24/2024 06:39 AM EST RP Workstation: [id number]   DG Chest Port 1 View Result Date: 10/23/2024 EXAM: 1 VIEW(S) XRAY OF THE CHEST 10/23/2024 06:50:00 AM COMPARISON: PA and lateral radiographs of the chest dated 03/19/2023. CLINICAL HISTORY: SOB (shortness of breath). FINDINGS: LUNGS AND PLEURA: Prominent interstitial opacities are again demonstrated within the lungs bilaterally. Questionable small pleural effusions. No pneumothorax. HEART AND MEDIASTINUM: The heart is borderline in size. There is calcification within the aortic arch. There is a mild hiatus hernia. BONES AND SOFT TISSUES: Patient is status post orthopedic fixation of the lumbar spine. IMPRESSION: 1. Prominent interstitial opacities bilaterally. 2. Questionable small pleural effusions. 3. Borderline heart size. 4. Calcification within the aortic arch. 5. Mild hiatus hernia. Electronically signed by: Evalene Coho MD 10/23/2024 07:19 AM EST RP Workstation: [id number]   DG Abd Portable 1V Result Date: 10/22/2024 EXAM: 1 VIEW XRAY OF THE ABDOMEN 10/22/2024 05:40:00 AM COMPARISON: Portable abdomen flat plate exam dated 10/21/2024 09:34  AM. CLINICAL HISTORY: SBO (small bowel obstruction) (HCC). FINDINGS: BOWEL: There is still mild dilatation of the lower abdominal small bowel segments up to 2.9 cm, previously 2.7 cm with very little if any change. There previously was a large amount of stool in the right-sided colon, which is not seen today. SOFT TISSUES: No opaque urinary calculi. BONES: Fusion hardware is again noted in the lumbar spine. IMPRESSION: 1. Mild dilatation of lower abdominal small bowel segments up to 2.9 cm, unchanged from prior within measurement variability. Electronically signed by: Francis Quam MD 10/22/2024 05:54 AM EST RP Workstation: [id number]   DG Abd Portable 1V Result Date: 10/21/2024 CLINICAL DATA:  Small bowel obstruction. EXAM: PORTABLE ABDOMEN - 1 VIEW COMPARISON:  10/18/2024 FINDINGS: Diffuse gaseous distention of small bowel noted, decreased when comparing to scout film from CT scan 1 day prior. Stool is visible in the right colon. Contrast material in the bladder is compatible with yesterday's CT scan. IMPRESSION: Diffuse gaseous distention of small bowel, decreased when comparing to scout film from CT scan 1 day prior. Electronically Signed   By: Camellia Candle  M.D.   On: 10/21/2024 09:59   CT ABDOMEN PELVIS W CONTRAST Result Date: 10/20/2024 EXAM: CT ABDOMEN AND PELVIS WITH CONTRAST 10/20/2024 03:17:45 PM TECHNIQUE: CT of the abdomen and pelvis was performed with the administration of 80 mL of iohexol (OMNIPAQUE) 300 MG/ML solution. Multiplanar reformatted images are provided for review. Automated exposure control, iterative reconstruction, and/or weight-based adjustment of the mA/kV was utilized to reduce the radiation dose to as low as reasonably achievable. COMPARISON: 12/12/2019 CLINICAL HISTORY: Bowel obstruction suspected. FINDINGS: LOWER CHEST: Moderate-sized hiatal hernia. LIVER: The liver is unremarkable. GALLBLADDER AND BILE DUCTS: Gallbladder is unremarkable. No biliary ductal dilatation.  SPLEEN: No acute abnormality. PANCREAS: No acute abnormality. ADRENAL GLANDS: No acute abnormality. KIDNEYS, URETERS AND BLADDER: Right renal cyst is noted. No stones in the kidneys or ureters. No hydronephrosis. No perinephric or periureteral stranding. Urinary bladder is unremarkable. GI AND BOWEL: Stomach demonstrates no acute abnormality. Large periumbilical hernia is noted which contains a portion of transverse colon but does not result in obstruction. Sigmoid diverticulosis without inflammation. Moderate diffuse small bowel dilatation is noted with some fecalization present concerning for distal obstruction. This appears to extend to the terminal ileum which appears to be severely thickened and extends into the ileocecal valve. This may represent inflammation although mass cannot be excluded. PERITONEUM AND RETROPERITONEUM: No ascites. No free air. VASCULATURE: Aorta is normal in caliber. Aortic atherosclerosis. LYMPH NODES: No lymphadenopathy. REPRODUCTIVE ORGANS: No acute abnormality. BONES AND SOFT TISSUES: No acute osseous abnormality. No focal soft tissue abnormality. IMPRESSION: 1. Moderate diffuse small bowel dilatation with fecalization, concerning for distal obstruction, likely at the terminal ileum; severe terminal ileal wall thickening with extension into the ileocecal valve, with differential including inflammatory etiology versus mass. 2. Large periumbilical hernia containing a portion of the transverse colon without obstruction. 3. Sigmoid diverticulosis without inflammation. Electronically signed by: Lynwood Seip MD 10/20/2024 03:46 PM EST RP Workstation: [id number]   (Echo, Carotid, EGD, Colonoscopy, ERCP)    Subjective: Nonverbal this morning  Discharge Exam: Vitals:   10/26/24 1300 10/26/24 1400  BP: (!) 94/52 (!) 92/47  Pulse: 70 67  Resp: 18 (!) 24  Temp: (!) 96.7 F (35.9 C)   SpO2: 99% 97%   Vitals:   10/26/24 1130 10/26/24 1258 10/26/24 1300 10/26/24 1400  BP: (!)  97/53 (!) 93/45 (!) 94/52 (!) 92/47  Pulse: 76 70 70 67  Resp: 20 18 18  (!) 24  Temp:   (!) 96.7 F (35.9 C)   TempSrc:   Axillary   SpO2: 98% 97% 99% 97%  Weight:      Height:        General: Pt is alert, awake, not in acute distress Cardiovascular: RRR, S1/S2 +, no rubs, no gallops Respiratory: CTA bilaterally, no wheezing, no rhonchi Abdominal: Soft, NT, ND, bowel sounds + Extremities: no edema, no cyanosis    The results of significant diagnostics from this hospitalization (including imaging, microbiology, ancillary and laboratory) are listed below for reference.     Microbiology: No results found for this or any previous visit (from the past 240 hours).   Labs: BNP (last 3 results) No results for input(s): BNP in the last 8760 hours. Basic Metabolic Panel: Recent Labs  Lab 10/22/24 0524 10/23/24 0735 10/24/24 0505 10/25/24 0808 10/25/24 1450 10/26/24 0310 10/26/24 1104 10/26/24 1105  NA 143 137 137 133* 133* 132* 134* 134*  K 4.4 4.6 4.1 4.5 4.4 4.4 4.1 4.1  CL 107 101 100 96* 96* 95*  --   --  CO2 28 26 23 22  21* 19*  --   --   GLUCOSE 96 153* 140* 140* 130* 108*  --   --   BUN 15 15 28* 43* 47* 53*  --   --   CREATININE 1.14* 1.29* 2.16* 3.29* 3.45* 4.02*  --   --   CALCIUM 8.9 9.2 8.7* 8.4* 8.0* 7.7*  --   --   MG 2.3 2.3  --   --   --   --   --   --   PHOS  --  2.2*  --   --   --   --   --   --    Liver Function Tests: Recent Labs  Lab 10/20/24 1353 10/21/24 0459  AST 24 25  ALT 8 <5  ALKPHOS 126 99  BILITOT 0.5 0.6  PROT 8.4* 7.1  ALBUMIN 4.0 3.4*   Recent Labs  Lab 10/20/24 1353  LIPASE 28   No results for input(s): AMMONIA in the last 168 hours. CBC: Recent Labs  Lab 10/20/24 1353 10/20/24 1404 10/21/24 0459 10/23/24 0735 10/24/24 0505 10/25/24 0808 10/26/24 0310 10/26/24 1104 10/26/24 1105  WBC 9.7  --  9.9 7.1 11.5* 10.0 8.9  --   --   NEUTROABS 8.5*  --   --   --   --   --   --   --   --   HGB 12.9   < > 11.6* 12.0  11.9* 11.7* 12.0 11.6* 11.6*  HCT 40.7   < > 37.3 38.6 37.5 36.5 38.6 34.0* 34.0*  MCV 90.6  --  91.9 90.8 89.9 89.9 91.0  --   --   PLT 303  --  306 273 278 306 298  --   --    < > = values in this interval not displayed.   Cardiac Enzymes: No results for input(s): CKTOTAL, CKMB, CKMBINDEX, TROPONINI in the last 168 hours. BNP: Invalid input(s): POCBNP CBG: No results for input(s): GLUCAP in the last 168 hours. D-Dimer No results for input(s): DDIMER in the last 72 hours. Hgb A1c No results for input(s): HGBA1C in the last 72 hours. Lipid Profile No results for input(s): CHOL, HDL, LDLCALC, TRIG, CHOLHDL, LDLDIRECT in the last 72 hours. Thyroid function studies No results for input(s): TSH, T4TOTAL, T3FREE, THYROIDAB in the last 72 hours.  Invalid input(s): FREET3 Anemia work up No results for input(s): VITAMINB12, FOLATE, FERRITIN, TIBC, IRON, RETICCTPCT in the last 72 hours. Urinalysis    Component Value Date/Time   COLORURINE AMBER (A) 10/24/2024 1800   APPEARANCEUR CLOUDY (A) 10/24/2024 1800   LABSPEC 1.015 10/24/2024 1800   PHURINE 5.0 10/24/2024 1800   GLUCOSEU NEGATIVE 10/24/2024 1800   HGBUR SMALL (A) 10/24/2024 1800   BILIRUBINUR NEGATIVE 10/24/2024 1800   KETONESUR NEGATIVE 10/24/2024 1800   PROTEINUR 100 (A) 10/24/2024 1800   NITRITE NEGATIVE 10/24/2024 1800   LEUKOCYTESUR MODERATE (A) 10/24/2024 1800   Sepsis Labs Recent Labs  Lab 10/23/24 0735 10/24/24 0505 10/25/24 0808 10/26/24 0310  WBC 7.1 11.5* 10.0 8.9   Microbiology No results found for this or any previous visit (from the past 240 hours).   Time coordinating discharge: Over 35 minutes  SIGNED:   Erle Odell Castor, MD  [facility] , 6:16 AM Pager   If 7PM-7AM, please contact night-coverage [email or web address] Password [email or web address]

## 2024-11-07 NOTE — Progress Notes (Signed)
Daily Progress Note   Patient Name: Jennifer [name]       Date: 10/13/2024 DOB: August 15, 1934  Age: 89 y.o. MRN#: [id number] Attending Physician: Odell Celinda Balo, MD Primary Care Physician: Cloria Annabella CROME, DO Admit Date: 10/20/2024 Length of Stay: 7 days  Reason for Consultation/Follow-up: Establishing goals of care  Subjective:   Reviewed EMR including recent documentation from hospitalist.  Discussed care with hospitalist and RN to coordinate care.  Planning for patient to discharge this morning via [facility] to receive hospice support at home through [facility] program.  At time of EMR reviewed past 24 hours patient has received as needed IV Dilaudid 0.5 mg x 2 doses, as needed oral Ativan 0.5 x 2 doses, and oral oxycodone  2.5 mg x 1 dose.  Had already discussed with [facility] provider medications to be prescribed for management at home. Patient's symptoms currently managed and will receive IV Dilaudid prior to transport today.  Objective:   Vital Signs:  BP (!) 92/47   Pulse 67   Temp (!) 96.7 F (35.9 C) (Axillary) Comment: warm blanket provided, thermostat adjusted  Resp (!) 24   Ht 5' 2 (1.575 m)   Wt 92.6 kg   LMP  (LMP Unknown)   SpO2 97%   BMI 37.34 kg/m   Assessment & Plan:   Assessment: Patient is an 89 year old female with a past medical history of peripheral vascular disease, obstructive sleep apnea on CPAP, depression, HFpEF, history of CAD status post PCI to LAD in 2019 with repeated cath in 2014 with stent placed, pulmonary hypertension, and chronic respiratory failure on 2 L of nasal cannula oxygen who was admitted on 10/20/2024 for management of abdominal pain and nausea and vomiting.  During hospital admission patient was found to have SBP/ileus on imaging.  Surgery consulted for recommendations.  Patient has also received management for acute on chronic respiratory failure, A-fib with RVR, and AKI.  Cardiology and nephrology consulted for recommendations.  Palliative  medicine team consulted to assist with complex medical decision making.   Recommendations/Plan: # Complex medical decision making/goals of care:     -Patient unable to participate in medical decision making secondary to medical status.                - Patient discharging today via [facility] transport to go home with end-of-life/hospice support through [facility].                  Code Status: Do not attempt resuscitation (DNR) - Comfort care   # Symptom management Patient is receiving these palliative interventions for symptom management with an intent to improve quality of life.                   -Pain/Dyspnea, acute in the setting of end-of-life care                               - Continue oral oxycodone  solution 2.5 every hour as needed.                   -Anxiety/agitation, in the setting of end-of-life care                               - Continue oral Ativan solution 0.26 mg every 4 hours as needed.                                 -  Continue oral Haldol solution 0.5 mg every 4 hours as needed.                   -Secretions, in the setting of end-of-life care                               - Continue as needed glycopyrrolate   # Psycho-social/Spiritual Support:  - Support System: daughters, grandson - Desire for further Chaplain support:yes   # Discharge Planning:  Home with Hospice through [facility] program today.  Thank you for allowing the palliative care team to participate in the care Lunabella Saldierna.  Tinnie Radar, DO Palliative Care Provider PMT # [phone]  If patient remains symptomatic despite maximum doses, please call PMT at [phone] between 0700 and 1900. Outside of these hours, please call attending, as PMT does not have night coverage.

## 2024-11-07 NOTE — Progress Notes (Signed)
 This nurse spoke with representative with [facility] referencing discharge instructions: instructions need to say home with [facility] home care for end of life care (currently home with hospice ordered). This nurse informed Dr. Celinda and Dr. Clayton of this information for continuity of care.

## 2024-11-07 NOTE — Progress Notes (Signed)
 Discharge instructions given to patient's daughter, Iris. All IV's removed at this time, all personal items accounted for, no further questions/concerns at this time. Patient transported home via [facility].

## 2024-11-07 NOTE — TOC Transition Note (Signed)
Transition of Care [facility]) - Discharge Note   Patient Details  Name: Jennifer [name] MRN: [id number] Date of Birth: 07-30-1934  [facility] of [facility]) CM/SW Contact:  Jon ONEIDA Anon, RN Phone Number: 10/18/2024, 9:10 AM   Clinical Narrative:    Pt to discharge home with services for end of life care through [facility]. Pt daughter Iris notified and is agreeable to the discharge plan. RNCM spoke with Crystal, case worker with [facility] and she states pt will need transportation via ambulance. She states all need DME has been delivered to the home. [facility] called at [phone]. Floor RN made aware. DC packet with signed DNR placed at RN station. There are no further IP CM needs at this time. Will sign off.   Final next level of care: Home w Hospice Care Barriers to Discharge: Barriers Resolved   Patient Goals and CMS Choice Patient states their goals for this hospitalization and ongoing recovery are:: Home with end of life care provided through [facility] CMS Medicare.gov Compare Post Acute Care list provided to:: Patient Represenative (must comment) (Pt daughter Iris) Choice offered to / list presented to : Adult Children [place] ownership interest in [facility].provided to:: Adult Children    Discharge Placement                Patient to be transferred to facility by: [facility] Name of family member notified: CONI FREDERICK (Daughter)  [phone] Patient and family notified of of transfer: 10/13/2024  Discharge Plan and Services Additional resources added to the After Visit Summary for     Discharge Planning Services: CM Consult Post Acute Care Choice: Durable Medical Equipment, Hospice          DME Arranged:  (DME arranged through [facility]) DME Agency: Other - Comment ([facility])       HH Arranged: NA HH Agency: NA        Social Drivers of Health (SDOH) Interventions SDOH Screenings   Food Insecurity: No Food Insecurity (10/21/2024)  Housing: Low Risk   (10/21/2024)  Transportation Needs: No Transportation Needs (10/21/2024)  Utilities: Not At Risk (10/21/2024)  Social Connections: Unknown (10/22/2024)  Tobacco Use: Low Risk  (10/22/2024)     Readmission Risk Interventions     No data to display

## 2024-11-07 DEATH — deceased

## 2024-11-08 ENCOUNTER — Ambulatory Visit (HOSPITAL_BASED_OUTPATIENT_CLINIC_OR_DEPARTMENT_OTHER): Payer: Medicare (Managed Care) | Admitting: Family

## 2024-11-11 ENCOUNTER — Ambulatory Visit: Payer: Medicare (Managed Care)

## 2024-12-13 ENCOUNTER — Ambulatory Visit: Payer: Medicare (Managed Care) | Admitting: Dermatology

## 2025-01-16 ENCOUNTER — Ambulatory Visit: Payer: Medicare (Managed Care) | Admitting: Pulmonary Disease
